# Patient Record
Sex: Male | Born: 1955 | Race: White | Hispanic: No | Marital: Single | Smoking: Never smoker
Health system: Southern US, Community
[De-identification: ages and names within clinical notes are randomized; demographics above are authoritative.]

## PROBLEM LIST (undated history)

## (undated) DIAGNOSIS — J189 Pneumonia, unspecified organism: Secondary | ICD-10-CM

## (undated) DIAGNOSIS — N400 Enlarged prostate without lower urinary tract symptoms: Secondary | ICD-10-CM

## (undated) DIAGNOSIS — H547 Unspecified visual loss: Secondary | ICD-10-CM

## (undated) DIAGNOSIS — C180 Malignant neoplasm of cecum: Secondary | ICD-10-CM

## (undated) DIAGNOSIS — K8001 Calculus of gallbladder with acute cholecystitis with obstruction: Secondary | ICD-10-CM

## (undated) DIAGNOSIS — B351 Tinea unguium: Secondary | ICD-10-CM

## (undated) DIAGNOSIS — G809 Cerebral palsy, unspecified: Secondary | ICD-10-CM

## (undated) DIAGNOSIS — N471 Phimosis: Secondary | ICD-10-CM

## (undated) DIAGNOSIS — D649 Anemia, unspecified: Secondary | ICD-10-CM

## (undated) DIAGNOSIS — R600 Localized edema: Secondary | ICD-10-CM

## (undated) DIAGNOSIS — I700 Atherosclerosis of aorta: Secondary | ICD-10-CM

## (undated) DIAGNOSIS — F39 Unspecified mood [affective] disorder: Secondary | ICD-10-CM

## (undated) DIAGNOSIS — N200 Calculus of kidney: Secondary | ICD-10-CM

## (undated) DIAGNOSIS — K219 Gastro-esophageal reflux disease without esophagitis: Secondary | ICD-10-CM

## (undated) DIAGNOSIS — K439 Ventral hernia without obstruction or gangrene: Secondary | ICD-10-CM

## (undated) DIAGNOSIS — H905 Unspecified sensorineural hearing loss: Secondary | ICD-10-CM

## (undated) DIAGNOSIS — I10 Essential (primary) hypertension: Secondary | ICD-10-CM

## (undated) DIAGNOSIS — R972 Elevated prostate specific antigen [PSA]: Secondary | ICD-10-CM

## (undated) HISTORY — PX: COLECTOMY: SHX59

## (undated) HISTORY — PX: COLONOSCOPY: SHX174

## (undated) HISTORY — PX: ESOPHAGOGASTRODUODENOSCOPY: SHX1529

## (undated) HISTORY — PX: DIAGNOSTIC LAPAROSCOPY: SUR761

## (undated) HISTORY — DX: Atherosclerosis of aorta: I70.0

## (undated) HISTORY — PX: CHOLECYSTECTOMY: SHX55

## (undated) HISTORY — PX: COLON SURGERY: SHX602

---

## 2005-10-05 ENCOUNTER — Emergency Department: Payer: Self-pay | Admitting: Emergency Medicine

## 2005-10-12 ENCOUNTER — Emergency Department: Payer: Self-pay | Admitting: Emergency Medicine

## 2008-06-25 ENCOUNTER — Emergency Department: Payer: Self-pay | Admitting: Emergency Medicine

## 2009-03-20 ENCOUNTER — Emergency Department: Payer: Self-pay | Admitting: Unknown Physician Specialty

## 2009-03-31 ENCOUNTER — Emergency Department: Payer: Self-pay | Admitting: Emergency Medicine

## 2009-04-30 ENCOUNTER — Ambulatory Visit: Payer: Self-pay | Admitting: Internal Medicine

## 2009-05-07 ENCOUNTER — Ambulatory Visit: Payer: Self-pay | Admitting: Surgery

## 2009-12-08 ENCOUNTER — Observation Stay: Payer: Self-pay

## 2010-07-05 ENCOUNTER — Ambulatory Visit: Payer: Self-pay | Admitting: Internal Medicine

## 2010-11-28 ENCOUNTER — Emergency Department: Payer: Self-pay | Admitting: Internal Medicine

## 2010-12-06 ENCOUNTER — Emergency Department: Payer: Self-pay | Admitting: Emergency Medicine

## 2011-09-25 ENCOUNTER — Ambulatory Visit: Payer: Self-pay | Admitting: Gastroenterology

## 2011-09-29 LAB — PATHOLOGY REPORT

## 2011-10-14 ENCOUNTER — Ambulatory Visit: Payer: Self-pay | Admitting: Surgery

## 2011-10-14 LAB — HEPATIC FUNCTION PANEL A (ARMC)
Albumin: 3.3 g/dL — ABNORMAL LOW (ref 3.4–5.0)
Alkaline Phosphatase: 98 U/L (ref 50–136)
Bilirubin, Direct: <0.1 mg/dL (ref 0.00–0.20)
SGOT(AST): 14 U/L — ABNORMAL LOW (ref 15–37)
Total Protein: 7.3 g/dL (ref 6.4–8.2)

## 2011-10-14 LAB — HEMOGLOBIN: HGB: 8.9 g/dL — ABNORMAL LOW (ref 13.0–18.0)

## 2011-10-23 ENCOUNTER — Inpatient Hospital Stay: Payer: Self-pay | Admitting: Surgery

## 2011-10-24 LAB — BASIC METABOLIC PANEL
BUN: 12 mg/dL (ref 7–18)
Calcium, Total: 8.3 mg/dL — ABNORMAL LOW (ref 8.5–10.1)
Chloride: 107 mmol/L (ref 98–107)
Creatinine: 0.9 mg/dL (ref 0.60–1.30)
Glucose: 136 mg/dL — ABNORMAL HIGH (ref 65–99)
Sodium: 139 mmol/L (ref 136–145)

## 2011-10-24 LAB — CBC WITH DIFFERENTIAL/PLATELET
Basophil #: 0 10*3/uL (ref 0.0–0.1)
Eosinophil #: 0 10*3/uL (ref 0.0–0.7)
Lymphocyte #: 0.6 10*3/uL — ABNORMAL LOW (ref 1.0–3.6)
Lymphocyte %: 4.5 %
MCH: 22.8 pg — ABNORMAL LOW (ref 26.0–34.0)
MCHC: 29.9 g/dL — ABNORMAL LOW (ref 32.0–36.0)
MCV: 76 fL — ABNORMAL LOW (ref 80–100)
Monocyte %: 8.5 %
Neutrophil #: 12.1 10*3/uL — ABNORMAL HIGH (ref 1.4–6.5)
Neutrophil %: 86.8 %
Platelet: 270 10*3/uL (ref 150–440)

## 2011-10-24 LAB — CEA: CEA: 1.9 ng/mL (ref 0.0–4.7)

## 2011-10-28 LAB — URINALYSIS, COMPLETE
Bacteria: NONE SEEN
Bilirubin,UR: NEGATIVE
Ketone: NEGATIVE
Leukocyte Esterase: NEGATIVE
Nitrite: NEGATIVE
Ph: 8 (ref 4.5–8.0)
Protein: NEGATIVE
RBC,UR: 3 /HPF (ref 0–5)
WBC UR: <1 /HPF (ref 0–5)

## 2011-10-28 LAB — CBC WITH DIFFERENTIAL/PLATELET
Basophil #: 0 10*3/uL (ref 0.0–0.1)
Eosinophil #: 0.6 10*3/uL (ref 0.0–0.7)
Eosinophil %: 5.6 %
HCT: 31.3 % — ABNORMAL LOW (ref 40.0–52.0)
MCV: 75 fL — ABNORMAL LOW (ref 80–100)
Monocyte #: 1.2 x10 3/mm — ABNORMAL HIGH (ref 0.2–1.0)
Monocyte %: 11.3 %
Neutrophil %: 69.9 %
Platelet: 340 10*3/uL (ref 150–440)
RBC: 4.18 10*6/uL — ABNORMAL LOW (ref 4.40–5.90)
RDW: 22.8 % — ABNORMAL HIGH (ref 11.5–14.5)

## 2011-11-20 ENCOUNTER — Ambulatory Visit: Payer: Self-pay | Admitting: Oncology

## 2011-11-20 LAB — CBC CANCER CENTER
Basophil #: 0 x10 3/mm (ref 0.0–0.1)
Eosinophil #: 0.4 x10 3/mm (ref 0.0–0.7)
Eosinophil %: 6.6 %
HCT: 33.8 % — ABNORMAL LOW (ref 40.0–52.0)
HGB: 10.1 g/dL — ABNORMAL LOW (ref 13.0–18.0)
Lymphocyte #: 1.3 x10 3/mm (ref 1.0–3.6)
MCH: 22.7 pg — ABNORMAL LOW (ref 26.0–34.0)
MCHC: 29.8 g/dL — ABNORMAL LOW (ref 32.0–36.0)
MCV: 76 fL — ABNORMAL LOW (ref 80–100)
Monocyte %: 8.7 %
Neutrophil #: 3.6 x10 3/mm (ref 1.4–6.5)
Neutrophil %: 61.5 %
RDW: 24.8 % — ABNORMAL HIGH (ref 11.5–14.5)
WBC: 5.9 x10 3/mm (ref 3.8–10.6)

## 2011-11-20 LAB — FERRITIN: Ferritin (ARMC): 30 ng/mL (ref 8–388)

## 2011-11-20 LAB — IRON AND TIBC
Iron Bind.Cap.(Total): 401 ug/dL (ref 250–450)
Iron: 22 ug/dL — ABNORMAL LOW (ref 65–175)
Unbound Iron-Bind.Cap.: 379 ug/dL

## 2011-11-21 LAB — CEA: CEA: 1.1 ng/mL (ref 0.0–4.7)

## 2011-11-29 ENCOUNTER — Ambulatory Visit: Payer: Self-pay | Admitting: Oncology

## 2011-12-29 ENCOUNTER — Ambulatory Visit: Payer: Self-pay | Admitting: Oncology

## 2012-02-23 ENCOUNTER — Ambulatory Visit: Payer: Self-pay | Admitting: Oncology

## 2012-02-23 LAB — CBC CANCER CENTER
Basophil #: 0 x10 3/mm (ref 0.0–0.1)
Basophil %: 0.3 %
Eosinophil #: 0.3 x10 3/mm (ref 0.0–0.7)
Eosinophil %: 4 %
HCT: 43.1 % (ref 40.0–52.0)
HGB: 14.4 g/dL (ref 13.0–18.0)
Lymphocyte %: 16.6 %
MCV: 92 fL (ref 80–100)
Monocyte %: 8.7 %
Neutrophil #: 5.1 x10 3/mm (ref 1.4–6.5)
RDW: 14.5 % (ref 11.5–14.5)
WBC: 7.3 x10 3/mm (ref 3.8–10.6)

## 2012-02-23 LAB — IRON AND TIBC
Iron Bind.Cap.(Total): 294 ug/dL (ref 250–450)
Iron Saturation: 20 %
Iron: 60 ug/dL — ABNORMAL LOW (ref 65–175)
Unbound Iron-Bind.Cap.: 234 ug/dL

## 2012-02-24 LAB — CEA: CEA: 1.9 ng/mL (ref 0.0–4.7)

## 2012-02-29 ENCOUNTER — Ambulatory Visit: Payer: Self-pay | Admitting: Oncology

## 2012-05-25 ENCOUNTER — Ambulatory Visit: Payer: Self-pay | Admitting: Oncology

## 2012-05-25 LAB — CBC CANCER CENTER
Eosinophil %: 3.3 %
HCT: 41.3 % (ref 40.0–52.0)
Lymphocyte #: 1.3 x10 3/mm (ref 1.0–3.6)
MCH: 32.8 pg (ref 26.0–34.0)
MCHC: 34.3 g/dL (ref 32.0–36.0)
MCV: 96 fL (ref 80–100)
Monocyte #: 0.5 x10 3/mm (ref 0.2–1.0)
Neutrophil #: 4.2 x10 3/mm (ref 1.4–6.5)
Neutrophil %: 67.6 %
Platelet: 213 x10 3/mm (ref 150–440)
RBC: 4.31 10*6/uL — ABNORMAL LOW (ref 4.40–5.90)

## 2012-05-25 LAB — IRON AND TIBC
Iron Bind.Cap.(Total): 269 ug/dL (ref 250–450)
Iron Saturation: 20 %
Iron: 54 ug/dL — ABNORMAL LOW (ref 65–175)
Unbound Iron-Bind.Cap.: 215 ug/dL

## 2012-05-25 LAB — FERRITIN: Ferritin (ARMC): 574 ng/mL — ABNORMAL HIGH (ref 8–388)

## 2012-05-26 LAB — CEA: CEA: 1.7 ng/mL (ref 0.0–4.7)

## 2012-05-30 ENCOUNTER — Ambulatory Visit: Payer: Self-pay | Admitting: Oncology

## 2012-06-15 DIAGNOSIS — R972 Elevated prostate specific antigen [PSA]: Secondary | ICD-10-CM | POA: Insufficient documentation

## 2012-08-24 ENCOUNTER — Ambulatory Visit: Payer: Self-pay | Admitting: Oncology

## 2012-08-26 LAB — CBC CANCER CENTER
Basophil #: 0 x10 3/mm (ref 0.0–0.1)
Eosinophil #: 0.3 x10 3/mm (ref 0.0–0.7)
Eosinophil %: 4.8 %
HCT: 44.3 % (ref 40.0–52.0)
MCV: 95 fL (ref 80–100)
Monocyte #: 0.4 x10 3/mm (ref 0.2–1.0)
Monocyte %: 7.5 %
Neutrophil %: 68.5 %
RBC: 4.66 10*6/uL (ref 4.40–5.90)
RDW: 12.5 % (ref 11.5–14.5)
WBC: 5.9 x10 3/mm (ref 3.8–10.6)

## 2012-08-26 LAB — IRON AND TIBC
Iron Saturation: 35 %
Unbound Iron-Bind.Cap.: 185 ug/dL

## 2012-08-26 LAB — FERRITIN: Ferritin (ARMC): 624 ng/mL — ABNORMAL HIGH (ref 8–388)

## 2012-08-28 ENCOUNTER — Ambulatory Visit: Payer: Self-pay | Admitting: Oncology

## 2012-10-20 ENCOUNTER — Ambulatory Visit: Payer: Self-pay | Admitting: Gastroenterology

## 2012-10-21 LAB — PATHOLOGY REPORT

## 2012-12-17 ENCOUNTER — Ambulatory Visit: Payer: Self-pay | Admitting: Oncology

## 2012-12-21 LAB — CBC CANCER CENTER
Basophil #: 0 x10 3/mm (ref 0.0–0.1)
Basophil %: 0.8 %
Eosinophil #: 0.3 x10 3/mm (ref 0.0–0.7)
Eosinophil %: 4.3 %
HGB: 15.7 g/dL (ref 13.0–18.0)
Lymphocyte %: 21 %
MCH: 32.1 pg (ref 26.0–34.0)
MCHC: 34.4 g/dL (ref 32.0–36.0)
Monocyte %: 6.6 %
Platelet: 234 x10 3/mm (ref 150–440)
RBC: 4.89 10*6/uL (ref 4.40–5.90)

## 2012-12-22 LAB — CEA: CEA: 1.8 ng/mL (ref 0.0–4.7)

## 2012-12-28 ENCOUNTER — Ambulatory Visit: Payer: Self-pay | Admitting: Oncology

## 2013-06-08 ENCOUNTER — Ambulatory Visit: Payer: Self-pay | Admitting: Urology

## 2013-10-10 ENCOUNTER — Ambulatory Visit: Payer: Self-pay | Admitting: Urology

## 2013-10-19 ENCOUNTER — Ambulatory Visit: Payer: Self-pay | Admitting: Urology

## 2013-10-26 ENCOUNTER — Ambulatory Visit: Payer: Self-pay | Admitting: Urology

## 2013-10-29 LAB — URINE CULTURE

## 2013-11-03 ENCOUNTER — Ambulatory Visit: Payer: Self-pay | Admitting: Urology

## 2013-11-14 ENCOUNTER — Ambulatory Visit: Payer: Self-pay | Admitting: Urology

## 2013-12-26 ENCOUNTER — Ambulatory Visit: Payer: Self-pay | Admitting: Urology

## 2014-02-13 ENCOUNTER — Ambulatory Visit: Payer: Self-pay | Admitting: Urology

## 2014-04-19 ENCOUNTER — Ambulatory Visit: Payer: Self-pay | Admitting: Otolaryngology

## 2014-07-06 DIAGNOSIS — K439 Ventral hernia without obstruction or gangrene: Secondary | ICD-10-CM | POA: Insufficient documentation

## 2014-09-22 DIAGNOSIS — C180 Malignant neoplasm of cecum: Secondary | ICD-10-CM | POA: Insufficient documentation

## 2014-09-22 DIAGNOSIS — R600 Localized edema: Secondary | ICD-10-CM | POA: Insufficient documentation

## 2014-09-22 DIAGNOSIS — B351 Tinea unguium: Secondary | ICD-10-CM | POA: Insufficient documentation

## 2014-10-21 NOTE — Op Note (Signed)
PATIENT NAME:  Shawn Sweeney, Shawn Sweeney MR#:  [id number] DATE OF BIRTH:  [birth year]  DATE OF PROCEDURE:  10/19/2013  PREOPERATIVE DIAGNOSIS: Left renal pelvic calculus.  POSTOPERATIVE DIAGNOSIS: Left renal pelvic calculus.  PROCEDURE:  1. Left ureteroscopy. 2. Placement of left ureteral stent.   SURGEON: Gregg Winchell C. Bernardo Heater, MD  ASSISTANT: None.  ANESTHETIC: General.  INDICATION: A 59 year old male with a history of recurrent stone disease. He has cerebral palsy and is in an assisted living facility. The stone was dense-appearing, and he had moderate hydronephrosis on ultrasound. Potential risks of shockwave lithotripsy were discussed with the patient's mother. She requested a minimally invasive procedure, and he is scheduled for an attempt at ureteroscopic  removal.  DESCRIPTION: The patient was taken to the operating room and placed on the table in the supine position. A general anesthetic was administered via an endotracheal tube, and he was then placed in the low lithotomy position. His external genitalia were prepped and draped in the usual fashion. A timeout was performed per protocol. A 21 French cystoscope was lubricated and passed under direct vision. The urethra was normal in caliber without stricture. The prostate was remarkable for mild lateral lobe enlargement and moderate bladder neck elevation. Bladder mucosa was normal in appearance, without erythema, solid or papillary lesions. The ureteral orifices were normal in appearance. A 0.035 guidewire was placed through the cystoscope and into the left ureteral orifice. Guidewire was used to pass it up into the renal pelvis. The stone was easily visualized on fluoroscopy. A 6 French semirigid ureteroscope was passed under direct vision. A 0.025 PTFE wire was placed through the ureteroscope and into the ureteral orifice, and the scope was advanced over the wire. The ureteroscope was easily passed up to the UPJ; however, the renal pelvis could not be  accessed. The ureteroscope was removed. A flexible ureteroscope was then placed over the wire; however, could not negotiate the distal ureter. The ureteroscope was removed, and a ureteral access sheath would not negotiate the distal ureter. Over-the-wire ureteral dilators were then placed from 6 [place] to 10 [place]. A 12 French dilator would not pass. Attempts at re-placing the ureteroscope were unsuccessful, with inability to negotiate the distal ureter. At this point, it was elected to place a ureteral stent. A 6 French/22 cm Contour ureteral stent was placed. Retrograde pyelogram was performed prior to stent placement. 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 The stent was placed in the lower portion of the bifid collecting system where the stone was located. There was good curl seen in the renal pelvis. The distal end of the stent was well positioned in the bladder under direct vision. All instruments were removed. A B and O suppository was placed in the rectum. He was taken to the PACU in stable condition. There were no complications. EBL was minimal.    ____________________________ Ronda Fairly. Bernardo Heater, MD scs:lb D: 10/19/2013 09:37:44 ET T: 10/19/2013 10:03:18 ET JOB#: [id number]  cc: Nicki Reaper C. Bernardo Heater, MD, <Dictator> Abbie Sons MD ELECTRONICALLY SIGNED 10/26/2013 12:44

## 2014-10-22 NOTE — Op Note (Signed)
PATIENT NAME:  Shawn Sweeney, Shawn Sweeney MR#:  [id number] DATE OF BIRTH:  [birth year]  DATE OF PROCEDURE:  10/23/2011  PREOPERATIVE DIAGNOSIS: Cancer of the cecum.   POSTOPERATIVE DIAGNOSIS: Cancer of the cecum.   PROCEDURE: Laparoscopic right colectomy.   SURGEON: Loreli Dollar, MD   ANESTHESIA: General.   INDICATIONS: This 59 year old male recently was found to be anemic, had colonoscopy demonstrating a mass in the cecum. Biopsy demonstrated cancer. Surgery was recommended for definitive treatment.   DESCRIPTION OF PROCEDURE: The patient was placed on the operating table in the supine position under general anesthesia. The abdomen was prepared with ChloraPrep and draped in a sterile manner. A short incision was made just below the umbilicus and carried down to the deep fascia which was grasped with laryngeal hook and elevated. A Veress needle was inserted, aspirated, and irrigated with a saline solution. Next, the peritoneal cavity was inflated with carbon dioxide. The Veress needle was removed. The 10 mm cannula was inserted. The 10 mm 0 degree laparoscope was inserted to view the peritoneal cavity. Another incision was made in the epigastrium just about an inch above the umbilicus and carried down through subcutaneous tissues and an 11 mm cannula was inserted. Next, another incision was made in the right lower quadrant to introduce a 5 mm cannula. The sigmoid colon was found to be redundant. The liver appeared normal. The right colon was identified. The right colon was mobilized with incision of the lateral peritoneal reflection using the Harmonic scalpel. The terminal ileum and appendix were mobilized as well. Next, the omentum was divided in the midline with the Harmonic scalpel. The right transverse colon was mobilized with Harmonic scalpel. The mesentery was separated from the duodenum. Additional dissection was carried out to further mobilize the terminal ileum, appendix, cecum, and ascending colon and  after satisfactory mobilization the laparoscopic instruments were removed. An incision was made from the infraumbilical port site to the supraumbilical port site and around to the left of the umbilicus so that the incision was approximately two inches in length. The right colon was brought out onto the abdominal wall. The mass was palpable in the cecum which was about 3 cm in dimension. There was some regional mesenteric adenopathy. A window was created in the mesentery of the terminal ileum some four inches proximal to the ileocecal valve and also a window was created in the mesentery of the transverse colon just to the right of the middle colic vessels. The mesenteric dissection was carried out with the Harmonic scalpel. The ileocolic artery and vein were suture ligated with 0 chromic and divided with the Harmonic scalpel. The small bowel was placed beside the transverse colon. An enterotomy and a colotomy were made. The anastomosis was begun with the 75 mm GIA stapler which was placed along the antimesenteric borders, engaged and activated. Staple line was hemostatic. The anastomosis was completed with application of XH-37 stapler which was placed perpendicular to the first, engaged and activated, and the specimen excised and submitted in formalin for routine pathology. The anastomosis was inspected. Several small bleeding points were cauterized. One bleeding point was ligated with 3-0 chromic. The junction of the staple lines was imbricated with 3-0 chromic. The mesenteric defect was closed with running 3-0 chromic. Hemostasis was intact and the bowel was replaced into the peritoneal cavity. Omentum was brought beneath the wound. The fascia was closed with interrupted 0 Maxon figure-of-eight sutures. The skin was closed with interrupted 4-0 nylon vertical mattress sutures. Dressings were  applied with paper tape. The patient tolerated surgery satisfactorily and was then prepared for transfer to the recovery room.    ____________________________ Lenna Sciara. Rochel Brome, MD jws:drc D: 10/23/2011 13:40:35 ET T: 10/23/2011 13:56:15 ET JOB#: [id number]  cc: Loreli Dollar, MD, <Dictator> Loreli Dollar MD ELECTRONICALLY SIGNED 10/23/2011 19:22

## 2014-10-22 NOTE — Discharge Summary (Signed)
PATIENT NAME:  Shawn Sweeney, Shawn Sweeney MR#:  [id number] DATE OF BIRTH:  01-Feb-1956  DATE OF ADMISSION:  10/23/2011 DATE OF DISCHARGE:  10/31/2011  He had  recent findings of iron deficiency anemia and had colonoscopy demonstrating a mass in the cecum. 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   PAST MEDICAL HISTORY:  Congenital deafness and  cerebral palsy, more recent development of blindness.   SOCIAL HISTORY: He lives at the [facility] home.  Details of past medical history and physical findings and medicines are found on the typed history and physical.   The patient had bowel preparation at home and came in where on 10/23/2011  had laparoscopic right colectomy. Postoperatively he was treated with IV fluids, analgesics, and made satisfactory progress.  He appeared to be very asymptomatic. His activity level is limited and mostly just rested in bed and sitting comfortable, his wound progressed satisfactorily. He was began a liquid diet advanced to a full liquid diet and eventually did demonstrate bowel function with a bowel movement and was discharged in satisfactory condition.  His pathology demonstrated invasive, moderately differentiated adenocarcinoma of the cecum, tumor size was 4.3 cm, tumor invaded through the muscularis propria into the subserosal adipose tissues but did not extend into serosal surface. All margins were clear. There was no metastatic disease found in 22 regional lymph nodes.   FINAL DIAGNOSIS: Carcinoma of cecum.   PROCEDURE: Laparoscopic right colectomy.        Discharge instructions were given. Plans were made for follow up in the office.    ____________________________ J. Rochel Brome, MD jws:ljs D: 11/10/2011 09:03:27 ET T: 11/11/2011 12:16:06 ET JOB#: [id number]  cc: Loreli Dollar, MD, <Dictator> Loreli Dollar MD ELECTRONICALLY SIGNED 11/11/2011 13:02

## 2014-12-14 ENCOUNTER — Encounter: Payer: Self-pay | Admitting: *Deleted

## 2014-12-14 ENCOUNTER — Inpatient Hospital Stay
Admission: EM | Admit: 2014-12-14 | Discharge: 2014-12-18 | DRG: 659 | Payer: Medicare Other | Attending: Internal Medicine | Admitting: Internal Medicine

## 2014-12-14 ENCOUNTER — Emergency Department: Payer: Medicare Other

## 2014-12-14 DIAGNOSIS — R4189 Other symptoms and signs involving cognitive functions and awareness: Secondary | ICD-10-CM

## 2014-12-14 DIAGNOSIS — Z87442 Personal history of urinary calculi: Secondary | ICD-10-CM

## 2014-12-14 DIAGNOSIS — H913 Deaf nonspeaking, not elsewhere classified: Secondary | ICD-10-CM | POA: Diagnosis present

## 2014-12-14 DIAGNOSIS — N1330 Unspecified hydronephrosis: Secondary | ICD-10-CM | POA: Diagnosis present

## 2014-12-14 DIAGNOSIS — Z888 Allergy status to other drugs, medicaments and biological substances status: Secondary | ICD-10-CM

## 2014-12-14 DIAGNOSIS — Z7951 Long term (current) use of inhaled steroids: Secondary | ICD-10-CM

## 2014-12-14 DIAGNOSIS — I959 Hypotension, unspecified: Secondary | ICD-10-CM | POA: Diagnosis present

## 2014-12-14 DIAGNOSIS — I10 Essential (primary) hypertension: Secondary | ICD-10-CM | POA: Diagnosis present

## 2014-12-14 DIAGNOSIS — F72 Severe intellectual disabilities: Secondary | ICD-10-CM | POA: Diagnosis present

## 2014-12-14 DIAGNOSIS — R4182 Altered mental status, unspecified: Secondary | ICD-10-CM | POA: Diagnosis present

## 2014-12-14 DIAGNOSIS — Z800 Family history of malignant neoplasm of digestive organs: Secondary | ICD-10-CM

## 2014-12-14 DIAGNOSIS — J189 Pneumonia, unspecified organism: Secondary | ICD-10-CM | POA: Diagnosis present

## 2014-12-14 DIAGNOSIS — N200 Calculus of kidney: Secondary | ICD-10-CM

## 2014-12-14 DIAGNOSIS — G809 Cerebral palsy, unspecified: Secondary | ICD-10-CM | POA: Diagnosis present

## 2014-12-14 DIAGNOSIS — N202 Calculus of kidney with calculus of ureter: Secondary | ICD-10-CM | POA: Diagnosis present

## 2014-12-14 DIAGNOSIS — H547 Unspecified visual loss: Secondary | ICD-10-CM | POA: Diagnosis present

## 2014-12-14 DIAGNOSIS — Z66 Do not resuscitate: Secondary | ICD-10-CM | POA: Diagnosis present

## 2014-12-14 DIAGNOSIS — Z8249 Family history of ischemic heart disease and other diseases of the circulatory system: Secondary | ICD-10-CM

## 2014-12-14 DIAGNOSIS — E876 Hypokalemia: Secondary | ICD-10-CM | POA: Diagnosis present

## 2014-12-14 DIAGNOSIS — R001 Bradycardia, unspecified: Secondary | ICD-10-CM | POA: Diagnosis present

## 2014-12-14 DIAGNOSIS — N132 Hydronephrosis with renal and ureteral calculous obstruction: Secondary | ICD-10-CM

## 2014-12-14 DIAGNOSIS — Z8042 Family history of malignant neoplasm of prostate: Secondary | ICD-10-CM

## 2014-12-14 DIAGNOSIS — H548 Legal blindness, as defined in USA: Secondary | ICD-10-CM | POA: Diagnosis present

## 2014-12-14 DIAGNOSIS — N400 Enlarged prostate without lower urinary tract symptoms: Secondary | ICD-10-CM | POA: Diagnosis present

## 2014-12-14 DIAGNOSIS — Z79899 Other long term (current) drug therapy: Secondary | ICD-10-CM

## 2014-12-14 DIAGNOSIS — Z85038 Personal history of other malignant neoplasm of large intestine: Secondary | ICD-10-CM

## 2014-12-14 DIAGNOSIS — F39 Unspecified mood [affective] disorder: Secondary | ICD-10-CM | POA: Diagnosis present

## 2014-12-14 DIAGNOSIS — H905 Unspecified sensorineural hearing loss: Secondary | ICD-10-CM | POA: Diagnosis present

## 2014-12-14 DIAGNOSIS — Z9049 Acquired absence of other specified parts of digestive tract: Secondary | ICD-10-CM | POA: Diagnosis present

## 2014-12-14 HISTORY — DX: Benign prostatic hyperplasia without lower urinary tract symptoms: N40.0

## 2014-12-14 HISTORY — DX: Unspecified visual loss: H54.7

## 2014-12-14 HISTORY — DX: Cerebral palsy, unspecified: G80.9

## 2014-12-14 HISTORY — DX: Anemia, unspecified: D64.9

## 2014-12-14 HISTORY — DX: Pneumonia, unspecified organism: J18.9

## 2014-12-14 HISTORY — DX: Calculus of kidney: N20.0

## 2014-12-14 HISTORY — DX: Essential (primary) hypertension: I10

## 2014-12-14 HISTORY — DX: Unspecified mood (affective) disorder: F39

## 2014-12-14 HISTORY — DX: Malignant neoplasm of cecum: C18.0

## 2014-12-14 HISTORY — DX: Unspecified sensorineural hearing loss: H90.5

## 2014-12-14 HISTORY — DX: Tinea unguium: B35.1

## 2014-12-14 LAB — URINALYSIS COMPLETE WITH MICROSCOPIC (ARMC ONLY)
BILIRUBIN URINE: NEGATIVE
Glucose, UA: NEGATIVE mg/dL
KETONES UR: NEGATIVE mg/dL
Leukocytes, UA: NEGATIVE
NITRITE: NEGATIVE
PROTEIN: NEGATIVE mg/dL
RBC / HPF: NONE SEEN RBC/hpf (ref 0–5)
SPECIFIC GRAVITY, URINE: 1.004 — AB (ref 1.005–1.030)
pH: 6 (ref 5.0–8.0)

## 2014-12-14 LAB — COMPREHENSIVE METABOLIC PANEL
ALK PHOS: 75 U/L (ref 38–126)
ALT: 18 U/L (ref 17–63)
AST: 19 U/L (ref 15–41)
Albumin: 3.7 g/dL (ref 3.5–5.0)
Anion gap: 8 (ref 5–15)
BILIRUBIN TOTAL: 0.4 mg/dL (ref 0.3–1.2)
BUN: 12 mg/dL (ref 6–20)
CHLORIDE: 105 mmol/L (ref 101–111)
CO2: 26 mmol/L (ref 22–32)
Calcium: 9.1 mg/dL (ref 8.9–10.3)
Creatinine, Ser: 0.93 mg/dL (ref 0.61–1.24)
GFR calc Af Amer: >60 mL/min (ref >60)
GFR calc non Af Amer: >60 mL/min (ref >60)
Glucose, Bld: 66 mg/dL (ref 65–99)
Potassium: 3.9 mmol/L (ref 3.5–5.1)
Sodium: 139 mmol/L (ref 135–145)
Total Protein: 7.3 g/dL (ref 6.5–8.1)

## 2014-12-14 LAB — CBC WITH DIFFERENTIAL/PLATELET
BASOS PCT: 1 %
Basophils Absolute: 0 10*3/uL (ref 0–0.1)
EOS PCT: 4 %
Eosinophils Absolute: 0.2 10*3/uL (ref 0–0.7)
HEMATOCRIT: 43.2 % (ref 40.0–52.0)
Hemoglobin: 14.2 g/dL (ref 13.0–18.0)
LYMPHS PCT: 20 %
Lymphs Abs: 1.1 10*3/uL (ref 1.0–3.6)
MCH: 31.8 pg (ref 26.0–34.0)
MCHC: 32.8 g/dL (ref 32.0–36.0)
MCV: 96.9 fL (ref 80.0–100.0)
MONO ABS: 0.4 10*3/uL (ref 0.2–1.0)
Monocytes Relative: 8 %
NEUTROS ABS: 3.6 10*3/uL (ref 1.4–6.5)
Neutrophils Relative %: 67 %
PLATELETS: 152 10*3/uL (ref 150–440)
RBC: 4.46 MIL/uL (ref 4.40–5.90)
RDW: 12.8 % (ref 11.5–14.5)
WBC: 5.4 10*3/uL (ref 3.8–10.6)

## 2014-12-14 LAB — GLUCOSE, CAPILLARY
Glucose-Capillary: 82 mg/dL (ref 65–99)
Glucose-Capillary: 82 mg/dL (ref 65–99)

## 2014-12-14 LAB — LIPASE, BLOOD: Lipase: 46 U/L (ref 22–51)

## 2014-12-14 LAB — AMMONIA: Ammonia: 29 umol/L (ref 9–35)

## 2014-12-14 MED ORDER — SODIUM CHLORIDE 0.9 % IJ SOLN: 3.0000 mL | INTRAMUSCULAR | Status: DC | PRN

## 2014-12-14 MED ORDER — SODIUM CHLORIDE 0.9 % IV BOLUS (SEPSIS)
1000.0000 mL | Freq: Once | INTRAVENOUS | Status: AC
  Administered 2014-12-14: 1000 mL via INTRAVENOUS

## 2014-12-14 MED ORDER — FLUOXETINE HCL 20 MG PO CAPS
20.0000 mg | ORAL_CAPSULE | Freq: Every day | ORAL | Status: DC
  Administered 2014-12-15 – 2014-12-18 (×4): 20 mg via ORAL
  Filled 2014-12-14 (×4): qty 1

## 2014-12-14 MED ORDER — ONDANSETRON HCL 4 MG PO TABS: 4.0000 mg | ORAL_TABLET | Freq: Four times a day (QID) | ORAL | Status: DC | PRN

## 2014-12-14 MED ORDER — FLUTICASONE PROPIONATE 50 MCG/ACT NA SUSP
2.0000 | Freq: Every day | NASAL | Status: DC
  Administered 2014-12-14 – 2014-12-17 (×4): 2 via NASAL
  Filled 2014-12-14: qty 16

## 2014-12-14 MED ORDER — PANTOPRAZOLE SODIUM 40 MG PO TBEC
40.0000 mg | DELAYED_RELEASE_TABLET | Freq: Every day | ORAL | Status: DC
  Administered 2014-12-15 – 2014-12-18 (×4): 40 mg via ORAL
  Filled 2014-12-14 (×4): qty 1

## 2014-12-14 MED ORDER — ACETAMINOPHEN 325 MG PO TABS: 650.0000 mg | ORAL_TABLET | Freq: Four times a day (QID) | ORAL | Status: DC | PRN

## 2014-12-14 MED ORDER — SODIUM CHLORIDE 0.9 % IJ SOLN
3.0000 mL | Freq: Two times a day (BID) | INTRAMUSCULAR | Status: DC
  Administered 2014-12-14 – 2014-12-15 (×3): 3 mL via INTRAVENOUS

## 2014-12-14 MED ORDER — LORATADINE 10 MG PO TABS
10.0000 mg | ORAL_TABLET | Freq: Every day | ORAL | Status: DC
  Administered 2014-12-15 – 2014-12-18 (×4): 10 mg via ORAL
  Filled 2014-12-14 (×4): qty 1

## 2014-12-14 MED ORDER — CLONAZEPAM 0.5 MG PO TABS
0.2500 mg | ORAL_TABLET | Freq: Three times a day (TID) | ORAL | Status: DC
  Administered 2014-12-14 – 2014-12-18 (×10): 0.25 mg via ORAL
  Filled 2014-12-14 (×11): qty 1

## 2014-12-14 MED ORDER — DUTASTERIDE 0.5 MG PO CAPS
0.5000 mg | ORAL_CAPSULE | Freq: Every day | ORAL | Status: DC
  Administered 2014-12-15 – 2014-12-18 (×4): 0.5 mg via ORAL
  Filled 2014-12-14 (×5): qty 1

## 2014-12-14 MED ORDER — ENOXAPARIN SODIUM 40 MG/0.4ML ~~LOC~~ SOLN: 40.0000 mg | SUBCUTANEOUS | Status: DC

## 2014-12-14 MED ORDER — TAMSULOSIN HCL 0.4 MG PO CAPS
0.4000 mg | ORAL_CAPSULE | Freq: Every day | ORAL | Status: DC
  Administered 2014-12-15 – 2014-12-18 (×4): 0.4 mg via ORAL
  Filled 2014-12-14 (×4): qty 1

## 2014-12-14 MED ORDER — ARIPIPRAZOLE 2 MG PO TABS
1.0000 mg | ORAL_TABLET | Freq: Every day | ORAL | Status: DC
  Administered 2014-12-15 – 2014-12-18 (×4): 1 mg via ORAL
  Filled 2014-12-14 (×5): qty 1

## 2014-12-14 MED ORDER — ACETAMINOPHEN 650 MG RE SUPP: 650.0000 mg | Freq: Four times a day (QID) | RECTAL | Status: DC | PRN

## 2014-12-14 MED ORDER — SODIUM CHLORIDE 0.9 % IV SOLN: 250.0000 mL | INTRAVENOUS | Status: DC | PRN

## 2014-12-14 MED ORDER — FERROUS SULFATE 325 (65 FE) MG PO TABS
325.0000 mg | ORAL_TABLET | Freq: Every day | ORAL | Status: DC
  Administered 2014-12-15 – 2014-12-18 (×4): 325 mg via ORAL
  Filled 2014-12-14 (×5): qty 1

## 2014-12-14 MED ORDER — ONDANSETRON HCL 4 MG/2ML IJ SOLN: 4.0000 mg | Freq: Four times a day (QID) | INTRAMUSCULAR | Status: DC | PRN

## 2014-12-14 MED ORDER — ALBUTEROL SULFATE (2.5 MG/3ML) 0.083% IN NEBU: 2.5000 mg | INHALATION_SOLUTION | RESPIRATORY_TRACT | Status: DC | PRN

## 2014-12-14 NOTE — H&P (Addendum)
[facility] NAME: Shawn Sweeney    MR#:  [id number]  DATE OF BIRTH:  1956-01-11  DATE OF ADMISSION:  12/14/2014  PRIMARY CARE PHYSICIAN: Adin Hector, MD   REQUESTING/REFERRING PHYSICIAN: Delman Kitten, MD  CHIEF COMPLAINT:   Chief Complaint  Patient presents with  . Altered Mental Status    resident of [facility], this am pt not as alert/active as usual    HISTORY OF PRESENT ILLNESS:  Shawn Sweeney  is a 59 y.o. male with a known history of Nephrolithiasis, Cerebral palsy, Congenital deafness, BPH, mood disorder, adenocarcinoma of the cecum, anemia and onychomycosis. The patient is nonverbal. The patient was sent from home. According to patient's sister and the health care giver, the patient was fine until this morning. He was found unresponsiveness and then sent to ED for further evaluation. According to health care giver, the patient was given routine medication this morning. The patient and reminded and responsive the ED and was found bradycardia at about 47 to 58. CAT scan of head and chest x-ray didn't show any abnormality. The CAT scan of abdomen and pelvis show nephrolithiasis with the left side mild to moderate hydronephrosis. However urinalysis is negative. The patient's laboratory data is in normal range. Dr. Jacqualine Code request admission.   PAST MEDICAL HISTORY:  No past medical history on file. Nephrolithiasis, Cerebral palsy, Congenital deafness, BPH, mood disorder, adenocarcinoma of the cecum, anemia and onychomycosis.   PAST SURGICAL HISTORY:  No past surgical history on file. Cholecystectomy, colectomy.  SOCIAL HISTORY:   History  Substance Use Topics  . Smoking status: Never Smoker   . Smokeless tobacco: Not on file  . Alcohol Use: No    FAMILY HISTORY:  No family history on file. Father has a CAD, prostate cancer and colon cancer. DRUG ALLERGIES:   Allergies  Allergen Reactions  . Risperidone Hives  and Other (See Comments)  . Levofloxacin Other (See Comments) and Rash    REVIEW OF SYSTEMS:  Unable to obtain review of system.   MEDICATIONS AT HOME:   Prior to Admission medications   Medication Sig Start Date End Date Taking? Authorizing Provider  ARIPiprazole (ABILIFY) 2 MG tablet Take 1 mg by mouth daily. 06/15/12  Yes Historical Provider, MD  clonazePAM (KLONOPIN) 0.5 MG tablet Take 0.25 mg by mouth 3 (three) times daily. 06/15/12  Yes Historical Provider, MD  Ferrous Sulfate (IRON) 142 (45 FE) MG TBCR Take 45 mg by mouth 2 (two) times a week.   Yes Historical Provider, MD  fexofenadine (ALLEGRA) 180 MG tablet Take 180 mg by mouth daily as needed for allergies or rhinitis.   Yes Historical Provider, MD  FLUoxetine (PROZAC) 20 MG capsule Take 20 mg by mouth daily.   Yes Historical Provider, MD  fluticasone (FLONASE) 50 MCG/ACT nasal spray Place 2 sprays into both nostrils at bedtime. 06/15/12  Yes Historical Provider, MD  furosemide (LASIX) 20 MG tablet Take 20 mg by mouth daily as needed.   Yes Historical Provider, MD  guaiFENesin (ROBITUSSIN) 100 MG/5ML liquid Take 100 mg by mouth every 6 (six) hours as needed for cough.   Yes Historical Provider, MD  potassium chloride (K-DUR) 10 MEQ tablet Take 10 mEq by mouth daily.   Yes Historical Provider, MD  tamsulosin (FLOMAX) 0.4 MG CAPS capsule Take 1 capsule by mouth daily. 06/15/12  Yes Historical Provider, MD  dutasteride (AVODART) 0.5 MG capsule Take 1 capsule by mouth daily.  Historical Provider, MD  hydrocortisone (ANUSOL-HC) 25 MG suppository Place 25 mg rectally every 12 (twelve) hours as needed.    Historical Provider, MD  Multiple Vitamin (MULTI-VITAMINS) TABS Take 1 tablet by mouth daily.    Historical Provider, MD  omeprazole (PRILOSEC) 20 MG capsule Take 20 mg by mouth 2 (two) times daily.    Historical Provider, MD      VITAL SIGNS:  Blood pressure 131/64, pulse 57, temperature 97.8 F (36.6 C), temperature source  Oral, resp. rate 18, weight 40.824 kg (90 lb), SpO2 100 %.  PHYSICAL EXAMINATION:  GENERAL:  59 y.o.-year-old patient lying in the bed sleeping with no acute distress. Unresponsive to verbal stimuli. EYES: Both eyes are blind. No scleral icterus.  HEENT: Head atraumatic, normocephalic. Unable to exam Oropharynx and nasopharynx.  NECK:  Supple, no jugular venous distention. No thyroid enlargement, no tenderness.  LUNGS: Normal breath sounds bilaterally, no wheezing, rales,rhonchi or crepitation. No use of accessory muscles of respiration.  CARDIOVASCULAR: S1, S2 normal. No murmurs, rubs, or gallops.  ABDOMEN: Soft, nontender, nondistended. Bowel sounds present. No organomegaly or mass.  EXTREMITIES: No pedal edema, cyanosis, or clubbing.  NEUROLOGIC: Unable to exam due to unresponsiveness and nonverbal  PSYCHIATRIC: nonverbal  SKIN: No obvious rash, lesion, or ulcer.   LABORATORY PANEL:   CBC  Recent Labs Lab 12/14/14 0942  WBC 5.4  HGB 14.2  HCT 43.2  PLT 152   ------------------------------------------------------------------------------------------------------------------  Chemistries   Recent Labs Lab 12/14/14 0942  NA 139  K 3.9  CL 105  CO2 26  GLUCOSE 66  BUN 12  CREATININE 0.93  CALCIUM 9.1  AST 19  ALT 18  ALKPHOS 75  BILITOT 0.4   ------------------------------------------------------------------------------------------------------------------  Cardiac Enzymes No results for input(s): TROPONINI in the last 168 hours. ------------------------------------------------------------------------------------------------------------------  RADIOLOGY:  Ct Abdomen Pelvis Wo Contrast  12/14/2014   CLINICAL DATA:  Altered mental status.  History of cerebral palsy.  EXAM: CT ABDOMEN AND PELVIS WITHOUT CONTRAST  TECHNIQUE: Multidetector CT imaging of the abdomen and pelvis was performed following the standard protocol without IV contrast.  COMPARISON:  03/20/2009 and  plain films of 02/13/2014.  FINDINGS: Lower chest: Left base volume loss. Mild left worse than right gynecomastia. Mild cardiomegaly. A moderate hiatal hernia.  Hepatobiliary: Multifactorial degradation, including patient arm position and overlying EKG lead artifact. Normal liver. Cholecystectomy, without biliary ductal dilatation.  Pancreas: Normal, without mass or ductal dilatation.  Spleen: Normal  Adrenals/Urinary Tract: Normal adrenal glands. Right extrarenal pelvis. An upper pole left renal lesion is most apparent on coronal image 82. Measures 7 mm and approximately 56 HU. Not readily apparent on the prior. Multiple left renal collecting system calculi, on the order of 8 mm or less. Bilateral renal cortical thinning is mild. Mild to moderate left-sided hydronephrosis secondary to a 4 mm stone at the left ureteropelvic junction. No distal ureteric stones. No bladder calculi.  Stomach/Bowel: Normal distal stomach. Colonic stool burden suggests constipation. Surgical site sugars in the region of the ileocecal junction.  Vascular/Lymphatic: Normal caliber of the aorta and branch vessels. No abdominopelvic adenopathy.  Reproductive: Prostatic calcifications.  Other: No significant free fluid.  Musculoskeletal: Osteopenia. Moderate convex left lumbar spine curvature.  IMPRESSION: 1. Mild to moderate left-sided hydroureteronephrosis secondary to a 4 mm left ureteropelvic junction stone. 2. Otherwise, multifactorial degradation as detailed above. 3. Left nephrolithiasis with bilateral renal cortical thinning. 4. 7 mm upper pole left renal lesion which is indeterminate. Given mild hyperattenuation, a hemorrhagic cyst is  favored. Although the most definitive characterization would be with pre and post contrast abdominal MRI, given patient's clinical status, ultrasound followup at 1 year is a suggested strategy. 5. Moderate hiatal hernia. 6. Bilateral gynecomastia. 7.  Possible constipation.   Electronically Signed   By:  Abigail Miyamoto M.D.   On: 12/14/2014 10:46   Ct Head Wo Contrast  12/14/2014   CLINICAL DATA:  Altered mental status, and decreased alertness in activity, not eating, history cerebral palsy, deafness, blindness  EXAM: CT HEAD WITHOUT CONTRAST  TECHNIQUE: Contiguous axial images were obtained from the base of the skull through the vertex without intravenous contrast.  COMPARISON:  12/08/2009  FINDINGS: Generalized atrophy.  Ex vacuo dilatation of the ventricular system, stable, consistent with degree of atrophy.  No midline shift or mass effect.  Stable appearance of brain parenchyma.  No evidence of mass lesion, intracranial hemorrhage or acute infarction.  Scattered dural calcifications in falx.  Partial opacification of RIGHT ethmoid air cells.  No acute osseous or sinus abnormalities otherwise seen.  IMPRESSION: Chronic atrophy and ventriculomegaly, stable.  No acute abnormalities.   Electronically Signed   By: Lavonia Dana M.D.   On: 12/14/2014 10:29   Dg Chest Port 1 View  12/14/2014   CLINICAL DATA:  Altered mental status/confusion  EXAM: PORTABLE CHEST - 1 VIEW  COMPARISON:  December 08, 2009  FINDINGS: There is focal airspace consolidation in the left base. Lungs elsewhere clear. Heart size and pulmonary vascularity are normal. No adenopathy. There is upper thoracic levoscoliosis.  IMPRESSION: Left base airspace consolidation consistent with pneumonia. Lungs otherwise clear. Followup PA and lateral chest radiographs recommended in 3-4 weeks following trial of antibiotic therapy to ensure resolution and exclude underlying malignancy.   Electronically Signed   By: Lowella Grip III M.D.   On: 12/14/2014 10:55    EKG:  No orders found for this or any previous visit.  IMPRESSION AND PLAN:   Altered mental status, unclear etiology Left side mild-to-moderate hydronephrosis Nephrolithiasis Cerebral palsy Congenital deafness BPH  The patient will be placed for observation. We cannot get a neuro  check due to noncommunication ,deafness and blindness. I will request a neurology consult for further recommendations. I will request a urology consult for left side hydronephrosis. Continue patient's home medication.    All the records are reviewed and case discussed with ED provider. Management plans discussed with the patient, family and they are in agreement.  CODE STATUS: DO NOT RESUSCITATE TOTAL TIME TAKING CARE OF THIS PATIENT: 53  minutes.    Demetrios Loll M.D on 12/14/2014 at 1:38 PM  Between 7am to 6pm - Pager - [phone]  After 6pm go to [email or web address] - password [email or web address] [facility]  [facility]  Office  [phone]  CC: Primary care physician; Adin Hector, MD

## 2014-12-14 NOTE — Care Management (Signed)
Medicare observation letter given and reviewed with POA and caregiver.  Copy signed and sent to medical records

## 2014-12-14 NOTE — ED Notes (Signed)
MD at bedside. 

## 2014-12-14 NOTE — ED Notes (Signed)
Patient transported to CT 

## 2014-12-14 NOTE — ED Notes (Signed)
Er md discussing opt with dr Bridgett Larsson for possible admission

## 2014-12-14 NOTE — ED Notes (Signed)
Patient is resting comfortably. 

## 2014-12-14 NOTE — ED Provider Notes (Signed)
[facility] Emergency Department Provider Note  ____________________________________________  Time seen: Approximately 9:18 AM  I have reviewed the triage vital signs and the nursing notes.   HISTORY  Chief Complaint Altered Mental Status  EM caveat: Patient nonverbal and nonresponsive.  HPI Shawn Sweeney is a 59 y.o. male who per records has a history of mental retardation, cerebral palsy, deafness, legally blind, and urinary incontinence.  Per report of EMS. In addition I did call the patient's mother, who did not answer her phone by left a message, the patient did not eat well this morning and has not been as interactive as normal. He is presently not responsive to attempts to communicate via gestures, touch, or sound.  No past medical history on file.  Past medical history includes severe mental retardation, deafness, cerebral palsy  There are no active problems to display for this patient.   No past surgical history on file.  Current Outpatient Rx  Name  Route  Sig  Dispense  Refill  . ARIPiprazole (ABILIFY) 2 MG tablet   Oral   Take 1 mg by mouth daily.         . clonazePAM (KLONOPIN) 0.5 MG tablet   Oral   Take 0.25 mg by mouth 3 (three) times daily.         . Ferrous Sulfate (IRON) 142 (45 FE) MG TBCR   Oral   Take 45 mg by mouth 2 (two) times a week.         . fexofenadine (ALLEGRA) 180 MG tablet   Oral   Take 180 mg by mouth daily as needed for allergies or rhinitis.         Marland Kitchen FLUoxetine (PROZAC) 20 MG capsule   Oral   Take 20 mg by mouth daily.         . fluticasone (FLONASE) 50 MCG/ACT nasal spray   Each Nare   Place 2 sprays into both nostrils at bedtime.         . furosemide (LASIX) 20 MG tablet   Oral   Take 20 mg by mouth daily as needed.         Marland Kitchen guaiFENesin (ROBITUSSIN) 100 MG/5ML liquid   Oral   Take 100 mg by mouth every 6 (six) hours as needed for cough.         . potassium chloride (K-DUR) 10  MEQ tablet   Oral   Take 10 mEq by mouth daily.         . tamsulosin (FLOMAX) 0.4 MG CAPS capsule   Oral   Take 1 capsule by mouth daily.         Marland Kitchen dutasteride (AVODART) 0.5 MG capsule   Oral   Take 1 capsule by mouth daily.         . hydrocortisone (ANUSOL-HC) 25 MG suppository   Rectal   Place 25 mg rectally every 12 (twelve) hours as needed.         . Multiple Vitamin (MULTI-VITAMINS) TABS   Oral   Take 1 tablet by mouth daily.         Marland Kitchen omeprazole (PRILOSEC) 20 MG capsule   Oral   Take 20 mg by mouth 2 (two) times daily.           Allergies Risperidone and Levofloxacin  No family history on file.  Social History History  Substance Use Topics  . Smoking status: Never Smoker   . Smokeless tobacco: Not on file  .  Alcohol Use: No    Review of Systems Unavailable. EM caveat ____________________________________________   PHYSICAL EXAM:  VITAL SIGNS: ED Triage Vitals  Enc Vitals Group     BP 12/14/14 0910 115/65 mmHg     Pulse Rate 12/14/14 0910 63     Resp 12/14/14 0910 20     Temp 12/14/14 0910 97.5 F (36.4 C)     Temp Source 12/14/14 0910 Axillary     SpO2 12/14/14 0910 100 %     Weight 12/14/14 0910 90 lb (40.824 kg)     Height --      Head Cir --      Peak Flow --      Pain Score --      Pain Loc --      Pain Edu? --      Excl. in [place]? --     Constitutional: The patient is unresponsive, but appears in no distress. He will occasionally move and grab at his armband, he allows [place] to examine him, and appears to be in no acute distress. Eyes: Appears to have cataracts bilaterally, his gaze is disconjugate. Pupils are midline bilaterally Head: Atraumatic. When the oropharynx is examined, the patient does bite down.  He appears to be protecting his airway well. Nose: No congestion/rhinnorhea. Mouth/Throat: Mucous membranes are moist.  Oropharynx non-erythematous. Edentulous. Neck: No stridor.   Cardiovascular: Normal rate, regular rhythm.  Grossly normal heart sounds.  Good peripheral circulation. Respiratory: Normal respiratory effort.  No retractions. Lungs CTAB. Gastrointestinal: Soft and nontender. No distention. No abdominal bruits. No CVA tenderness. Patient rolled on his backside, his perineum buttock and back are all normal in appearance without erythema or redness. Musculoskeletal: No lower extremity tenderness nor edema.  No joint effusions. He does have some muscular rigidity in all extremities, but overall range of motion is not significantly impeded. Neurologic:  Unable to perform. Overall, patient is sedate/obtunded. Yet in no distress. He does occasionally move his arms and pick at wristband, he makes occasional purposeful movements and occasionally claps his hand. Skin:  Skin is warm, dry and intact. No rash noted.  ____________________________________________   LABS (all labs ordered are listed, but only abnormal results are displayed)  Labs Reviewed  URINALYSIS COMPLETEWITH MICROSCOPIC ([place]) - Abnormal; Notable for the following:    Color, Urine YELLOW (*)    APPearance CLEAR (*)    Specific Gravity, Urine 1.004 (*)    Hgb urine dipstick 1+ (*)    Bacteria, UA RARE (*)    Squamous Epithelial / LPF 0-5 (*)    All other components within normal limits  CBC WITH DIFFERENTIAL/PLATELET  COMPREHENSIVE METABOLIC PANEL  LIPASE, BLOOD  AMMONIA  GLUCOSE, CAPILLARY  BLOOD GAS, VENOUS  CBG MONITORING, ED  CBG MONITORING, ED   ____________________________________________  EKG  ED ECG REPORT I, Glorious Flicker, the attending physician, personally viewed and interpreted this ECG.  Date: 12/14/2014 EKG Time: 911 Rate: 65 Rhythm: normal sinus rhythm, occasional fusion complex QRS Axis: Septal Q-wave Intervals: normal ST/T Wave abnormalities: normal Conduction Disutrbances: none Narrative Interpretation: Sinus rhythm, occasional fusion, septal Q-wave. No evidence of acute ST  elevation  ____________________________________________  RADIOLOGY  DG Chest Port 1 View (Final result) Result time: 12/14/14 10:55:01   Final result by Rad Results In Interface (12/14/14 10:55:01)   Narrative:   CLINICAL DATA: Altered mental status/confusion  EXAM: PORTABLE CHEST - 1 VIEW  COMPARISON: December 08, 2009  FINDINGS: There is focal airspace consolidation in the left  base. Lungs elsewhere clear. Heart size and pulmonary vascularity are normal. No adenopathy. There is upper thoracic levoscoliosis.  IMPRESSION: Left base airspace consolidation consistent with pneumonia. Lungs otherwise clear. Followup PA and lateral chest radiographs recommended in 3-4 weeks following trial of antibiotic therapy to ensure resolution and exclude underlying malignancy.   Electronically Signed By: Lowella Grip III M.D. On: 12/14/2014 10:55          CT Abdomen Pelvis Wo Contrast (Final result) Result time: 12/14/14 10:46:06   Final result by Rad Results In Interface (12/14/14 10:46:06)   Narrative:   CLINICAL DATA: Altered mental status. History of cerebral palsy.  EXAM: CT ABDOMEN AND PELVIS WITHOUT CONTRAST  TECHNIQUE: Multidetector CT imaging of the abdomen and pelvis was performed following the standard protocol without IV contrast.  COMPARISON: 03/20/2009 and plain films of 02/13/2014.  FINDINGS: Lower chest: Left base volume loss. Mild left worse than right gynecomastia. Mild cardiomegaly. A moderate hiatal hernia.  Hepatobiliary: Multifactorial degradation, including patient arm position and overlying EKG lead artifact. Normal liver. Cholecystectomy, without biliary ductal dilatation.  Pancreas: Normal, without mass or ductal dilatation.  Spleen: Normal  Adrenals/Urinary Tract: Normal adrenal glands. Right extrarenal pelvis. An upper pole left renal lesion is most apparent on coronal image 82. Measures 7 mm and approximately 56 HU. Not  readily apparent on the prior. Multiple left renal collecting system calculi, on the order of 8 mm or less. Bilateral renal cortical thinning is mild. Mild to moderate left-sided hydronephrosis secondary to a 4 mm stone at the left ureteropelvic junction. No distal ureteric stones. No bladder calculi.  Stomach/Bowel: Normal distal stomach. Colonic stool burden suggests constipation. Surgical site sugars in the region of the ileocecal junction.  Vascular/Lymphatic: Normal caliber of the aorta and branch vessels. No abdominopelvic adenopathy.  Reproductive: Prostatic calcifications.  Other: No significant free fluid.  Musculoskeletal: Osteopenia. Moderate convex left lumbar spine curvature.  IMPRESSION: 1. Mild to moderate left-sided hydroureteronephrosis secondary to a 4 mm left ureteropelvic junction stone. 2. Otherwise, multifactorial degradation as detailed above. 3. Left nephrolithiasis with bilateral renal cortical thinning. 4. 7 mm upper pole left renal lesion which is indeterminate. Given mild hyperattenuation, a hemorrhagic cyst is favored. Although the most definitive characterization would be with pre and post contrast abdominal MRI, given patient's clinical status, ultrasound followup at 1 year is a suggested strategy. 5. Moderate hiatal hernia. 6. Bilateral gynecomastia. 7. Possible constipation.    Chest x-ray shows a questionable left air space of consolidation on the base. This is not demonstrated on CT. In addition, patient is afebrile, no rales on exam, normal white count, with no recent infectious symptoms. I suspect at this point that he likely does not have clinical pneumonia. ____________________________________________   PROCEDURES  Procedure(s) performed: None  Critical Care performed: Yes, see critical care note(s)  CRITICAL CARE Performed by: Delman Kitten   Total critical care time: 35  Critical care time was exclusive of separately billable  procedures and treating other patients.  Critical care was necessary to treat or prevent imminent or life-threatening deterioration.  Critical care was time spent personally by me on the following activities: development of treatment plan with patient and/or surrogate as well as nursing, discussions with consultants, evaluation of patient's response to treatment, examination of patient, obtaining history from patient or surrogate, ordering and performing treatments and interventions, ordering and review of laboratory studies, ordering and review of radiographic studies, pulse oximetry and re-evaluation of patient's condition.  Patient presents with unresponsiveness, GCS is  less than 13. The patient required immediate evaluation by myself and complex decision making in consideration of potential diagnosis, including acute intracranial hemorrhage, severe metabolic encephalopathy, etc.  ____________________________________________   INITIAL IMPRESSION / ASSESSMENT AND PLAN / ED COURSE  Pertinent labs & imaging results that were available during my care of the patient were reviewed by me and considered in my medical decision making (see chart for details).  Patient presents with acute change in mental status. I'm unable to collect history from the patient's next of kin. I attempted to call the one number listed and did not receive a call back yet. Per EMS the patient was more weak this morning, not eating well, and brought to the ER for evaluation of acute mental status change.  Reassuringly, the patient is calm with stable vital signs, and does not appear to be in any acute distress. However, his mental status seems to have changed from his baseline. I do not believe that he is any acute in extremis, but I am concerned that the patient may have developed encephalopathy or delirium. Alternative diagnosis would include acute neurologic change including intracranial hemorrhage or stroke. There is no  evidence of acute myocardial infarction on EKG. We will obtain a broad differential of labs including infectious workup, metabolic workup, CT of the head, cardiac evaluation.  ----------------------------------------- 9:36 AM on 12/14/2014 -----------------------------------------  Patient's caretakers arrived. They note that at 7:00 when they began caring for him he didn't seem right, he was not eating his normal way, cannot bring in the fork to his mouth like he normally would, they don't know how he was when he woke up. They noted that while at the breakfast table he was clapping, and seem like he might be in pain. He was leaning and curling into a ball. They took him to his day program and he continued to become more sleepy, prompting them to call 911. Caretakers are also trying to reach out to the patient's healthcare power of attorney, listed as his mother on his paperwork. ____________________________________________ ----------------------------------------- 12:08 PM on 12/14/2014 -----------------------------------------  Patient essentially remains unresponsive. He is slightly bradycardic but otherwise stable vital signs and in no distress. His family is at bedside, if the patient is stimulated she will close his eyes and good his jaw slightly but he otherwise is not showing any signs of spontaneous movement. He does not appear in pain though.  Because of his clinical status, I anticipate admission. I did discuss with his family his CODE STATUS. His mother, sister, and brother are present. They all note that they would not want him maintained on any artificial life support. If his heart were to stop they would not want it restarted and if his breathing were to stop, they would not want a ventilator or beathing tube because of his already poor quality of life. He is DO NOT RESUSCITATE.  I discussed evaluation with his family. Personally, I still do not have a good examination for his  unresponsiveness. Is possible that he is any deep stable sleep, he does not take any obvious sedative medications. His labs CT are all fairly reassuring. He does have acute left-sided nephrolithiasis but did not appear to be in pain. He did discuss his CT with Dr. Tresa Moore (urology). I will admit the patient to the hospital service for further evaluation of his acute mental status change.  FINAL CLINICAL IMPRESSION(S) / ED DIAGNOSES  Final diagnoses:  Unresponsive state  Kidney stone on left side  Delman Kitten, MD 12/14/14 [phone]

## 2014-12-14 NOTE — Consult Note (Signed)
Reason for Consult: Left Large Volume Nephrolithaisis with Hydronephrosis  Referring Physician: Demetrios Loll MD  Shawn Sweeney is an 59 y.o. male.   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 UA s/o infectious parameters. No fevers or leukocytosis.   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 No blood thinners. His POA is mother and brother who are very involved in his care.  Today "[name] is seen in consultation for above.   Past Medical History  Diagnosis Date  . Hypertension     Past Surgical History  Procedure Laterality Date  . Colon surgery    . Diagnostic laparoscopy      No family history on file.  Social History:  reports that he has never smoked. He does not have any smokeless tobacco history on file. He reports that he does not use illicit drugs. His alcohol history is not on file.  Allergies:  Allergies  Allergen Reactions  . Risperidone Hives and Other (See Comments)  . Levofloxacin Other (See Comments) and Rash    Medications: I have reviewed the patient's current medications.  Results for orders placed or performed during the hospital encounter of 12/14/14 (from the past 48 hour(s))  CBC with Differential     Status: None   Collection Time: 12/14/14  9:42 AM  Result Value Ref Range   WBC 5.4 3.8 - 10.6 K/uL   RBC 4.46 4.40 - 5.90 MIL/uL   Hemoglobin 14.2 13.0 - 18.0 g/dL   HCT 43.2 40.0 - 52.0 %   MCV 96.9 80.0 - 100.0 fL   MCH 31.8 26.0 - 34.0 pg   MCHC 32.8 32.0 - 36.0 g/dL   RDW 12.8 11.5 - 14.5 %   Platelets 152 150 - 440 K/uL   Neutrophils Relative % 67 %   Neutro Abs 3.6 1.4 -  6.5 K/uL   Lymphocytes Relative 20 %   Lymphs Abs 1.1 1.0 - 3.6 K/uL   Monocytes Relative 8 %   Monocytes Absolute 0.4 0.2 - 1.0 K/uL   Eosinophils Relative 4 %   Eosinophils Absolute 0.2 0 - 0.7 K/uL   Basophils Relative 1 %   Basophils Absolute 0.0 0 - 0.1 K/uL  Comprehensive metabolic panel     Status: None   Collection Time: 12/14/14  9:42 AM  Result Value Ref Range   Sodium 139 135 - 145 mmol/L   Potassium 3.9 3.5 - 5.1 mmol/L   Chloride 105 101 - 111 mmol/L   CO2 26 22 - 32 mmol/L   Glucose, Bld 66 65 - 99 mg/dL   BUN 12 6 - 20 mg/dL   Creatinine, Ser 0.93 0.61 - 1.24 mg/dL   Calcium 9.1 8.9 - 10.3 mg/dL   Total Protein 7.3 6.5 - 8.1 g/dL   Albumin 3.7 3.5 - 5.0 g/dL   AST 19 15 - 41 U/L   ALT 18 17 - 63 U/L   Alkaline Phosphatase 75 38 - 126 U/L   Total Bilirubin 0.4 0.3 - 1.2 mg/dL   GFR calc non Af Amer >60 >60 mL/min   GFR calc Af Amer >60 >60 mL/min  Comment: (NOTE) The eGFR has been calculated using the CKD EPI equation. This calculation has not been validated in all clinical situations. eGFR's persistently <60 mL/min signify possible Chronic Kidney Disease.    Anion gap 8 5 - 15  Lipase, blood     Status: None   Collection Time: 12/14/14  9:42 AM  Result Value Ref Range   Lipase 46 22 - 51 U/L  Ammonia     Status: None   Collection Time: 12/14/14  9:42 AM  Result Value Ref Range   Ammonia 29 9 - 35 umol/L    Comment: HEMOLYSIS AT THIS LEVEL MAY AFFECT RESULT  Glucose, capillary     Status: None   Collection Time: 12/14/14  9:43 AM  Result Value Ref Range   Glucose-Capillary 82 65 - 99 mg/dL  Urinalysis complete, with microscopic ([facility] only)     Status: Abnormal   Collection Time: 12/14/14 10:48 AM  Result Value Ref Range   Color, Urine YELLOW (A) YELLOW   APPearance CLEAR (A) CLEAR   Glucose, UA NEGATIVE NEGATIVE mg/dL   Bilirubin Urine NEGATIVE NEGATIVE   Ketones, ur NEGATIVE NEGATIVE mg/dL   Specific Gravity, Urine 1.004 (L) 1.005 - 1.030    Hgb urine dipstick 1+ (A) NEGATIVE   pH 6.0 5.0 - 8.0   Protein, ur NEGATIVE NEGATIVE mg/dL   Nitrite NEGATIVE NEGATIVE   Leukocytes, UA NEGATIVE NEGATIVE   RBC / HPF NONE SEEN 0 - 5 RBC/hpf   WBC, UA 0-5 0 - 5 WBC/hpf   Bacteria, UA RARE (A) NONE SEEN   Squamous Epithelial / LPF 0-5 (A) NONE SEEN  Glucose, capillary     Status: None   Collection Time: 12/14/14 12:15 PM  Result Value Ref Range   Glucose-Capillary 82 65 - 99 mg/dL    Ct Abdomen Pelvis Wo Contrast  12/14/2014   CLINICAL DATA:  Altered mental status.  History of cerebral palsy.  EXAM: CT ABDOMEN AND PELVIS WITHOUT CONTRAST  TECHNIQUE: Multidetector CT imaging of the abdomen and pelvis was performed following the standard protocol without IV contrast.  COMPARISON:  03/20/2009 and plain films of 02/13/2014.  FINDINGS: Lower chest: Left base volume loss. Mild left worse than right gynecomastia. Mild cardiomegaly. A moderate hiatal hernia.  Hepatobiliary: Multifactorial degradation, including patient arm position and overlying EKG lead artifact. Normal liver. Cholecystectomy, without biliary ductal dilatation.  Pancreas: Normal, without mass or ductal dilatation.  Spleen: Normal  Adrenals/Urinary Tract: Normal adrenal glands. Right extrarenal pelvis. An upper pole left renal lesion is most apparent on coronal image 82. Measures 7 mm and approximately 56 HU. Not readily apparent on the prior. Multiple left renal collecting system calculi, on the order of 8 mm or less. Bilateral renal cortical thinning is mild. Mild to moderate left-sided hydronephrosis secondary to a 4 mm stone at the left ureteropelvic junction. No distal ureteric stones. No bladder calculi.  Stomach/Bowel: Normal distal stomach. Colonic stool burden suggests constipation. Surgical site sugars in the region of the ileocecal junction.  Vascular/Lymphatic: Normal caliber of the aorta and branch vessels. No abdominopelvic adenopathy.  Reproductive: Prostatic calcifications.   Other: No significant free fluid.  Musculoskeletal: Osteopenia. Moderate convex left lumbar spine curvature.  IMPRESSION: 1. Mild to moderate left-sided hydroureteronephrosis secondary to a 4 mm left ureteropelvic junction stone. 2. Otherwise, multifactorial degradation as detailed above. 3. Left nephrolithiasis with bilateral renal cortical thinning. 4. 7 mm upper pole left renal lesion which is indeterminate. Given mild hyperattenuation, a hemorrhagic cyst is  favored. Although the most definitive characterization would be with pre and post contrast abdominal MRI, given patient's clinical status, ultrasound followup at 1 year is a suggested strategy. 5. Moderate hiatal hernia. 6. Bilateral gynecomastia. 7.  Possible constipation.   Electronically Signed   By: Abigail Miyamoto M.D.   On: 12/14/2014 10:46   Ct Head Wo Contrast  12/14/2014   CLINICAL DATA:  Altered mental status, and decreased alertness in activity, not eating, history cerebral palsy, deafness, blindness  EXAM: CT HEAD WITHOUT CONTRAST  TECHNIQUE: Contiguous axial images were obtained from the base of the skull through the vertex without intravenous contrast.  COMPARISON:  12/08/2009  FINDINGS: Generalized atrophy.  Ex vacuo dilatation of the ventricular system, stable, consistent with degree of atrophy.  No midline shift or mass effect.  Stable appearance of brain parenchyma.  No evidence of mass lesion, intracranial hemorrhage or acute infarction.  Scattered dural calcifications in falx.  Partial opacification of RIGHT ethmoid air cells.  No acute osseous or sinus abnormalities otherwise seen.  IMPRESSION: Chronic atrophy and ventriculomegaly, stable.  No acute abnormalities.   Electronically Signed   By: Lavonia Dana M.D.   On: 12/14/2014 10:29   Dg Chest Port 1 View  12/14/2014   CLINICAL DATA:  Altered mental status/confusion  EXAM: PORTABLE CHEST - 1 VIEW  COMPARISON:  December 08, 2009  FINDINGS: There is focal airspace consolidation in the left  base. Lungs elsewhere clear. Heart size and pulmonary vascularity are normal. No adenopathy. There is upper thoracic levoscoliosis.  IMPRESSION: Left base airspace consolidation consistent with pneumonia. Lungs otherwise clear. Followup PA and lateral chest radiographs recommended in 3-4 weeks following trial of antibiotic therapy to ensure resolution and exclude underlying malignancy.   Electronically Signed   By: Lowella Grip III M.D.   On: 12/14/2014 10:55    Review of Systems  Unable to perform ROS: medical condition  Constitutional: Negative for fever.   Blood pressure 131/64, pulse 57, temperature 97.8 F (36.6 C), temperature source Oral, resp. rate 18, weight 91 lb (41.277 kg), SpO2 100 %. Physical Exam  Constitutional:  Thin, stigmata of advanced cerebral palsey but very well cared for. Wife, brother, and caretaker at bedside. Pt non-verbal but non-combative and generally cooperative.   HENT:  Head: Normocephalic and atraumatic.  Eyes: Pupils are equal, round, and reactive to light.  Neck: Normal range of motion.  Cardiovascular: Normal rate.   Respiratory: Effort normal.  GI: Soft.  Prior surgical scars c/d/i. No hernais. Non-obese.   Genitourinary: Penis normal.  No CVAT. Preserved window to left flank with lateral positionaing despite contractures.   Musculoskeletal:  UE and LE contractures, most of which are soft.   Neurological:  AO x 0.   Skin: Skin is warm.    Assessment/Plan:   1-  Left Large Volume Nephrolithaisis with Hydronephrosis - Cr normal and no GU infectious parameters. Unclear if current stone related to some mild mental status changes. Discuss with family options including  A - Aggressive Curative - Intent Management : Place left neph tube this admission, then would need left percutaneous nephrostolithotomy in elective setting, preferably in [place] with goal of stone free. This would address his small obstructing stone but also his considerable  ipsilateral stone burden which has been problematic x years.  B - Palliative Intent Management: no curative- intent surgery, comfort care meds only, understanding that his obstruction and stone may lead to recurrent UTI, even urosepsis, and likely ipsilateral renal demise.  Family  desires curative-intent management and this is in keeping with prior goals of care. I agree.  Will make NPO and reqeust left neph tube by IR this admission. OK to DC after neph tube placement and back to medical baselien. We will arrange for GU followup in outpatient setting.    Shawn Sweeney 12/14/2014, 3:46 PM

## 2014-12-14 NOTE — ED Notes (Signed)
Caretaker states pt ususally does not sleep this much during the day

## 2014-12-15 ENCOUNTER — Inpatient Hospital Stay: Payer: Medicare Other

## 2014-12-15 DIAGNOSIS — R001 Bradycardia, unspecified: Secondary | ICD-10-CM | POA: Diagnosis present

## 2014-12-15 DIAGNOSIS — N400 Enlarged prostate without lower urinary tract symptoms: Secondary | ICD-10-CM | POA: Diagnosis present

## 2014-12-15 DIAGNOSIS — N200 Calculus of kidney: Secondary | ICD-10-CM | POA: Diagnosis present

## 2014-12-15 DIAGNOSIS — I959 Hypotension, unspecified: Secondary | ICD-10-CM | POA: Diagnosis present

## 2014-12-15 DIAGNOSIS — H913 Deaf nonspeaking, not elsewhere classified: Secondary | ICD-10-CM | POA: Diagnosis present

## 2014-12-15 DIAGNOSIS — N132 Hydronephrosis with renal and ureteral calculous obstruction: Secondary | ICD-10-CM | POA: Diagnosis present

## 2014-12-15 DIAGNOSIS — G809 Cerebral palsy, unspecified: Secondary | ICD-10-CM | POA: Diagnosis present

## 2014-12-15 DIAGNOSIS — Z85038 Personal history of other malignant neoplasm of large intestine: Secondary | ICD-10-CM | POA: Diagnosis not present

## 2014-12-15 DIAGNOSIS — E876 Hypokalemia: Secondary | ICD-10-CM | POA: Diagnosis present

## 2014-12-15 DIAGNOSIS — H548 Legal blindness, as defined in USA: Secondary | ICD-10-CM | POA: Diagnosis present

## 2014-12-15 DIAGNOSIS — Z800 Family history of malignant neoplasm of digestive organs: Secondary | ICD-10-CM | POA: Diagnosis not present

## 2014-12-15 DIAGNOSIS — Z66 Do not resuscitate: Secondary | ICD-10-CM | POA: Diagnosis present

## 2014-12-15 DIAGNOSIS — H543 Unqualified visual loss, both eyes: Secondary | ICD-10-CM | POA: Insufficient documentation

## 2014-12-15 DIAGNOSIS — F39 Unspecified mood [affective] disorder: Secondary | ICD-10-CM | POA: Diagnosis present

## 2014-12-15 DIAGNOSIS — I10 Essential (primary) hypertension: Secondary | ICD-10-CM | POA: Diagnosis present

## 2014-12-15 DIAGNOSIS — Z8249 Family history of ischemic heart disease and other diseases of the circulatory system: Secondary | ICD-10-CM | POA: Diagnosis not present

## 2014-12-15 DIAGNOSIS — Z79899 Other long term (current) drug therapy: Secondary | ICD-10-CM | POA: Diagnosis not present

## 2014-12-15 DIAGNOSIS — N202 Calculus of kidney with calculus of ureter: Secondary | ICD-10-CM | POA: Diagnosis present

## 2014-12-15 DIAGNOSIS — R410 Disorientation, unspecified: Secondary | ICD-10-CM | POA: Diagnosis not present

## 2014-12-15 DIAGNOSIS — H905 Unspecified sensorineural hearing loss: Secondary | ICD-10-CM | POA: Diagnosis present

## 2014-12-15 DIAGNOSIS — Z888 Allergy status to other drugs, medicaments and biological substances status: Secondary | ICD-10-CM | POA: Diagnosis not present

## 2014-12-15 DIAGNOSIS — J189 Pneumonia, unspecified organism: Secondary | ICD-10-CM | POA: Diagnosis present

## 2014-12-15 DIAGNOSIS — F72 Severe intellectual disabilities: Secondary | ICD-10-CM | POA: Diagnosis present

## 2014-12-15 DIAGNOSIS — Z9049 Acquired absence of other specified parts of digestive tract: Secondary | ICD-10-CM | POA: Diagnosis present

## 2014-12-15 DIAGNOSIS — R4182 Altered mental status, unspecified: Secondary | ICD-10-CM | POA: Diagnosis present

## 2014-12-15 DIAGNOSIS — Z8042 Family history of malignant neoplasm of prostate: Secondary | ICD-10-CM | POA: Diagnosis not present

## 2014-12-15 DIAGNOSIS — H547 Unspecified visual loss: Secondary | ICD-10-CM | POA: Diagnosis present

## 2014-12-15 DIAGNOSIS — Z87442 Personal history of urinary calculi: Secondary | ICD-10-CM | POA: Diagnosis not present

## 2014-12-15 DIAGNOSIS — Z7951 Long term (current) use of inhaled steroids: Secondary | ICD-10-CM | POA: Diagnosis not present

## 2014-12-15 MED ORDER — FENTANYL CITRATE (PF) 100 MCG/2ML IJ SOLN
INTRAMUSCULAR | Status: AC
  Administered 2014-12-15: 19:00:00
  Filled 2014-12-15: qty 4

## 2014-12-15 MED ORDER — MIDAZOLAM HCL 5 MG/5ML IJ SOLN
INTRAMUSCULAR | Status: AC
  Administered 2014-12-15: 19:00:00
  Filled 2014-12-15: qty 5

## 2014-12-15 MED ORDER — AZITHROMYCIN 500 MG IV SOLR
250.0000 mg | INTRAVENOUS | Status: DC
  Administered 2014-12-15 – 2014-12-16 (×2): 250 mg via INTRAVENOUS
  Filled 2014-12-15 (×4): qty 250

## 2014-12-15 MED ORDER — LIDOCAINE HCL (PF) 1 % IJ SOLN
INTRAMUSCULAR | Status: DC | PRN
  Administered 2014-12-15: 10 mL

## 2014-12-15 MED ORDER — IOHEXOL 300 MG/ML  SOLN
30.0000 mL | Freq: Once | INTRAMUSCULAR | Status: AC | PRN
  Administered 2014-12-15: 5 mL

## 2014-12-15 MED ORDER — SODIUM CHLORIDE 0.9 % IV SOLN
INTRAVENOUS | Status: DC
  Administered 2014-12-15 – 2014-12-17 (×6): via INTRAVENOUS

## 2014-12-15 MED ORDER — LIDOCAINE HCL (PF) 1 % IJ SOLN
INTRAMUSCULAR | Status: AC
  Filled 2014-12-15: qty 10

## 2014-12-15 MED ORDER — CEFTRIAXONE SODIUM IN DEXTROSE 20 MG/ML IV SOLN
1.0000 g | INTRAVENOUS | Status: DC
  Administered 2014-12-15 – 2014-12-17 (×3): 1 g via INTRAVENOUS
  Filled 2014-12-15 (×4): qty 50

## 2014-12-15 MED ORDER — FENTANYL CITRATE (PF) 100 MCG/2ML IJ SOLN
INTRAMUSCULAR | Status: AC | PRN
  Administered 2014-12-15: 25 ug via INTRAVENOUS

## 2014-12-15 MED ORDER — MIDAZOLAM HCL 5 MG/5ML IJ SOLN
INTRAMUSCULAR | Status: AC | PRN
  Administered 2014-12-15: 1 mg via INTRAVENOUS

## 2014-12-15 NOTE — Progress Notes (Signed)
Initial Nutrition Assessment       INTERVENTION:   (Meals and snacks: Once diet progressed will cater to pt prefences)  NUTRITION DIAGNOSIS:  Inadequate oral intake related to inability to eat as evidenced by NPO status.    GOAL:  Patient will meet greater than or equal to 90% of their needs    MONITOR:   (Energy intake)  REASON FOR ASSESSMENT:   (low BMI)    ASSESSMENT:  Pt admitted with AMS, planning possible left nephrostomy tube placement today. Neurology to evaluate today  PMHx:  Past Medical History  Diagnosis Date  . Hypertension   . Nephrolithiasis   . Cerebral palsy   . Congenital deafness   . BPH (benign prostatic hyperplasia)   . Adenocarcinoma of cecum   . Anemia   . Blind   . Mood disorder   . Onychomycosis     Current Nutrition: NPO. Brother, Scientist, physiological at bedside and reports pt ate 100% of supper last night but had to be fed   Food/Nutrition-Related History: Brother reports good appetite prior to admission   Labs:  Electrolyte and Renal Profile:  Recent Labs Lab 12/14/14 0942  BUN 12  CREATININE 0.93  NA 139  K 3.9    Medications: Fe sulfate   Digestive system: 6/16 last BM noted   Physical Findings:  Unable to complete Nutrition-Focused physical exam at this time.    Weight Change: Brother reports stable weight prior to admission   Height:  Ht Readings from Last 1 Encounters:  12/14/14 5' (1.524 m)    Weight:  Wt Readings from Last 1 Encounters:  12/14/14 91 lb (41.277 kg)        Wt Readings from Last 10 Encounters:  12/14/14 91 lb (41.277 kg)    BMI:  Body mass index is 17.77 kg/(m^2).     Skin:  Reviewed, no issues  Diet Order:  Diet NPO time specified Except for: Sips with Meds  EDUCATION NEEDS:  No education needs identified at this time    LOW Care Level Leanore Biggers B. Zenia Resides, [place], [place] (pager)

## 2014-12-15 NOTE — Clinical Social Work Note (Signed)
Clinical Social Work Assessment  Patient Details  Name: Shawn Sweeney MRN: [id number] Date of Birth: 1955/10/01  Date of referral:  12/15/14               Reason for consult:  Facility Placement (pt is from [facility] group home in [place])                Permission sought to share information with:  Customer service manager, Family Supports Permission granted to share information::  No (pt is no verbal and did not respond to CSW)  Name::        Agency::     Relationship::     Contact Information:     Housing/Transportation Living arrangements for the past 2 months:  Group Home Source of Information:  Other (Comment Required) (pt's brother and HCPOA- Shawn Sweeney) [phone]) Patient Interpreter Needed:  None Criminal Activity/Legal Involvement Pertinent to Current Situation/Hospitalization:  No - Comment as needed Significant Relationships:  Siblings Lives with:  Facility Resident Do you feel safe going back to the place where you live?    Need for family participation in patient care:  Yes (Comment)  Care giving concerns:  None expressed at this time.  Family is in agreement with pt's DC back to group home once he is medically stable and unless otherwise recommended.   Social Worker assessment / plan:  CSW spoke to pt's brother.  He is in the process of becoming the legal guardian for the pt.  He is also in agreement with DC back to group home once pt is medically stable.  Employment status:  Disabled (Comment on whether or not currently receiving Disability) Insurance information:    PT Recommendations:    Information / Referral to community resources:     Patient/Family's Response to care:  in agreement with DC back to group home once pt is medically stable.  Patient/Family's Understanding of and Emotional Response to Diagnosis, Current Treatment, and Prognosis:  Pt's brother understood this DC plan and is in agreement with it.  Emotional  Assessment Appearance:    Attitude/Demeanor/Rapport:    Affect (typically observed):    Orientation:    Alcohol / Substance use:    Psych involvement (Current and /or in the community):  No (Comment)  Discharge Needs  Concerns to be addressed:    Readmission within the last 30 days:    Current discharge risk:    Barriers to Discharge:      Mathews Argyle, LCSW 12/15/2014, 2:46 PM

## 2014-12-15 NOTE — Progress Notes (Signed)
Patient ID: Shawn Sweeney, male   DOB: 1955/09/09, 59 y.o.   MRN: [id number] SUBJECTIVE:  Pt with h/o cerebral palsy with spasticity, congenital deafness, blindness, with prior h/o colon cancer, presented with unresponsiveness.  Pt not verbally communicative at baseline, though is able to indicate pain/discomfort, and family had not noted any indication of this recently.  Noted to have bradycardia/hypotension.  Imaging reveals possible left pneumonia as well as left kidney stone with hydronephrosis.  No fever, chills; no N/V/D noted.  Family reports pt has been eating well  ______________________________________________________________________  ROS: Please see HPI; pt unable to provide ROS  Past Medical History  Diagnosis Date  . Hypertension   . Nephrolithiasis   . Cerebral palsy   . Congenital deafness   . BPH (benign prostatic hyperplasia)   . Adenocarcinoma of cecum   . Anemia   . Blind   . Mood disorder   . Onychomycosis     Past Surgical History  Procedure Laterality Date  . Colon surgery    . Diagnostic laparoscopy    . Cholecystectomy    . Colectomy       Current facility-administered medications:  .  0.9 %  sodium chloride infusion, 250 mL, Intravenous, PRN, Demetrios Loll, MD .  0.9 %  sodium chloride infusion, , Intravenous, Continuous, Tama High III, MD .  acetaminophen (TYLENOL) tablet 650 mg, 650 mg, Oral, Q6H PRN **OR** acetaminophen (TYLENOL) suppository 650 mg, 650 mg, Rectal, Q6H PRN, Demetrios Loll, MD .  albuterol (PROVENTIL) (2.5 MG/3ML) 0.083% nebulizer solution 2.5 mg, 2.5 mg, Nebulization, Q2H PRN, Demetrios Loll, MD .  ARIPiprazole (ABILIFY) tablet 1 mg, 1 mg, Oral, Daily, Demetrios Loll, MD, 1 mg at 12/14/14 1554 .  azithromycin (ZITHROMAX) 250 mg in dextrose 5 % 125 mL IVPB, 250 mg, Intravenous, Q24H, Tama High III, MD .  cefTRIAXone (ROCEPHIN) 1 g in dextrose 5 % 50 mL IVPB - Premix, 1 g, Intravenous, Q24H, Tama High III, MD .  clonazePAM [facility]bye [place]) tablet  0.25 mg, 0.25 mg, Oral, TID, Demetrios Loll, MD, 0.25 mg at 12/14/14 2110 .  dutasteride (AVODART) capsule 0.5 mg, 0.5 mg, Oral, Daily, Demetrios Loll, MD, 0.5 mg at 12/14/14 1556 .  ferrous sulfate tablet 325 mg, 325 mg, Oral, Daily, Demetrios Loll, MD, 325 mg at 12/14/14 1558 .  FLUoxetine (PROZAC) capsule 20 mg, 20 mg, Oral, Daily, Demetrios Loll, MD, 20 mg at 12/14/14 1557 .  fluticasone (FLONASE) 50 MCG/ACT nasal spray 2 spray, 2 spray, Each Nare, QHS, Demetrios Loll, MD, 2 spray at 12/14/14 2111 .  loratadine (CLARITIN) tablet 10 mg, 10 mg, Oral, Daily, Demetrios Loll, MD, 10 mg at 12/14/14 1557 .  ondansetron (ZOFRAN) tablet 4 mg, 4 mg, Oral, Q6H PRN **OR** ondansetron (ZOFRAN) injection 4 mg, 4 mg, Intravenous, Q6H PRN, Demetrios Loll, MD .  pantoprazole (PROTONIX) EC tablet 40 mg, 40 mg, Oral, Daily, Demetrios Loll, MD, 40 mg at 12/14/14 1558 .  sodium chloride 0.9 % injection 3 mL, 3 mL, Intravenous, Q12H, Demetrios Loll, MD, 3 mL at 12/14/14 2118 .  sodium chloride 0.9 % injection 3 mL, 3 mL, Intravenous, PRN, Demetrios Loll, MD .  tamsulosin [facility]) capsule 0.4 mg, 0.4 mg, Oral, Daily, Demetrios Loll, MD, 0.4 mg at 12/14/14 1558  PHYSICAL EXAM:  BP 92/49 mmHg  Pulse 64  Temp(Src) 98.9 F (37.2 C) (Oral)  Resp 16  Ht 5' (1.524 m)  Wt 41.277 kg (91 lb)  BMI 17.77 kg/m2  SpO2 98%  General: Thin  male, in fetal position, in NAD HEENT: pupils minimally reactive; bilateral blindness; OP difficult to examine Neck: supple, trachea midline, no thyromegaly Chest: normal to palpation Lungs: decreased airflow without retractions or wheezes Cardiovascular: RRR,  no gallop; distal pulses 1+ Abdomen: soft, nontender, nondistended, positive bowel sounds Extremities: no clubbing, cyanosis, edema Neuro: diffuse spasticity Derm: no significant rashes or nodules; decreased skin turgor Lymph: no cervical or supraclavicular lymphadenopathy  Labs and imaging studies were reviewed  ASSESSMENT/PLAN:   1.  AMS- etiology unclear; neuro to see.   Adding abx coverage for possible pneumonia, though no fever, no leukocytosis.  Some cough.  Follow telemetry and add IVF, given bradycardia and hypotension.  Echo arranged.  Check troponin 2. Renal stone- nephrology to see; does have hydronephrosis, but no other overt signs of discomfort from this, and pt is able to show when he is in pain 3. Mood disorder.  Cont home medications.  ;

## 2014-12-15 NOTE — Plan of Care (Signed)
Problem: Consults Goal: General Medical Patient Education See Patient Education Module for specific education.  Outcome: Not Met (add Reason) Pt is blind and deaf     

## 2014-12-15 NOTE — Consult Note (Signed)
CC: AMS  HPI: Shawn Sweeney is an 59 y.o. male . with a known history of Nephrolithiasis, Cerebral palsy, Congenital deafness, BPH, mood disorder, adenocarcinoma of the cecum, anemia and onychomycosis. The patient is nonverbal. The patient was sent from home. According to patient's sister and the health care giver, the patient was fine until this morning.  Possibility of L renal sone and L PNA which is being treated.  Mental status improved as per family.    Past Medical History  Diagnosis Date  . Hypertension   . Nephrolithiasis   . Cerebral palsy   . Congenital deafness   . BPH (benign prostatic hyperplasia)   . Adenocarcinoma of cecum   . Anemia   . Blind   . Mood disorder   . Onychomycosis     Past Surgical History  Procedure Laterality Date  . Colon surgery    . Diagnostic laparoscopy    . Cholecystectomy    . Colectomy      Family History  Problem Relation Age of Onset  . CAD Father   . Prostate cancer Father   . Colon cancer Father     Social History:  reports that he has never smoked. He does not have any smokeless tobacco history on file. He reports that he does not drink alcohol or use illicit drugs.  Allergies  Allergen Reactions  . Risperidone Hives and Other (See Comments)  . Levofloxacin Other (See Comments) and Rash    Medications: I have reviewed the patient's current medications.  ROS: Unable to obtain as pt is non verbal at [place]  Physical Examination: Blood pressure 116/60, pulse 52, temperature 97.8 F (36.6 C), temperature source Oral, resp. rate 14, height 5' (1.524 m), weight 41.277 kg (91 lb), SpO2 99 %.  Pt is non verbal, blind, does not follow commands.  Appears to be moving b/l UE symmetrically, weak in b/l LE since he is wheelchair bound   Laboratory Studies:   Basic Metabolic Panel:  Recent Labs Lab 12/14/14 0942  NA 139  K 3.9  CL 105  CO2 26  GLUCOSE 66  BUN 12  CREATININE 0.93  CALCIUM 9.1    Liver Function  Tests:  Recent Labs Lab 12/14/14 0942  AST 19  ALT 18  ALKPHOS 75  BILITOT 0.4  PROT 7.3  ALBUMIN 3.7    Recent Labs Lab 12/14/14 0942  LIPASE 46    Recent Labs Lab 12/14/14 0942  AMMONIA 29    CBC:  Recent Labs Lab 12/14/14 0942  WBC 5.4  NEUTROABS 3.6  HGB 14.2  HCT 43.2  MCV 96.9  PLT 152    Cardiac Enzymes: No results for input(s): CKTOTAL, CKMB, CKMBINDEX, TROPONINI in the last 168 hours.  BNP: Invalid input(s): POCBNP  CBG:  Recent Labs Lab 12/14/14 0943 12/14/14 1215  GLUCAP 82 21    Microbiology: Results for orders placed or performed in visit on 10/26/13  Urine culture     Status: None   Collection Time: 10/27/13  7:30 AM  Result Value Ref Range Status   Micro Text Report   Final       SOURCE: CLEAN CATCH    ORGANISM 1                30,000 CFU/ML ESCHERICHIA COLI   ANTIBIOTIC                    ORG#1     AMPICILLIN  R         CEFAZOLIN                     S         CEFOXITIN                     S         CEFTRIAXONE                   S         CIPROFLOXACIN                 R         ERTAPENEM                     S         GENTAMICIN                    S         IMIPENEM                      S         LEVOFLOXACIN                  R         NITROFURANTOIN                S         TRIMETHOPRIM/SULFAMETHOXAZOLE R             Coagulation Studies: No results for input(s): LABPROT, INR in the last 72 hours.  Urinalysis:  Recent Labs Lab 12/14/14 1048  COLORURINE YELLOW*  LABSPEC 1.004*  PHURINE 6.0  GLUCOSEU NEGATIVE  HGBUR 1+*  BILIRUBINUR NEGATIVE  KETONESUR NEGATIVE  PROTEINUR NEGATIVE  NITRITE NEGATIVE  LEUKOCYTESUR NEGATIVE    Lipid Panel:  No results found for: CHOL, TRIG, HDL, CHOLHDL, VLDL, LDLCALC  HgbA1C: No results found for: HGBA1C  Urine Drug Screen:  No results found for: LABOPIA, COCAINSCRNUR, LABBENZ, AMPHETMU, THCU, LABBARB  Alcohol Level: No results for input(s): ETH in the  last 168 hours.  Other results: EKG: normal EKG, normal sinus rhythm, unchanged from previous tracings.  Imaging: Ct Abdomen Pelvis Wo Contrast  12/14/2014   CLINICAL DATA:  Altered mental status.  History of cerebral palsy.  EXAM: CT ABDOMEN AND PELVIS WITHOUT CONTRAST  TECHNIQUE: Multidetector CT imaging of the abdomen and pelvis was performed following the standard protocol without IV contrast.  COMPARISON:  03/20/2009 and plain films of 02/13/2014.  FINDINGS: Lower chest: Left base volume loss. Mild left worse than right gynecomastia. Mild cardiomegaly. A moderate hiatal hernia.  Hepatobiliary: Multifactorial degradation, including patient arm position and overlying EKG lead artifact. Normal liver. Cholecystectomy, without biliary ductal dilatation.  Pancreas: Normal, without mass or ductal dilatation.  Spleen: Normal  Adrenals/Urinary Tract: Normal adrenal glands. Right extrarenal pelvis. An upper pole left renal lesion is most apparent on coronal image 82. Measures 7 mm and approximately 56 HU. Not readily apparent on the prior. Multiple left renal collecting system calculi, on the order of 8 mm or less. Bilateral renal cortical thinning is mild. Mild to moderate left-sided hydronephrosis secondary to a 4 mm stone at the left ureteropelvic junction. No distal ureteric stones. No bladder calculi.  Stomach/Bowel: Normal distal stomach. Colonic stool burden suggests constipation. Surgical site sugars in the region of the  ileocecal junction.  Vascular/Lymphatic: Normal caliber of the aorta and branch vessels. No abdominopelvic adenopathy.  Reproductive: Prostatic calcifications.  Other: No significant free fluid.  Musculoskeletal: Osteopenia. Moderate convex left lumbar spine curvature.  IMPRESSION: 1. Mild to moderate left-sided hydroureteronephrosis secondary to a 4 mm left ureteropelvic junction stone. 2. Otherwise, multifactorial degradation as detailed above. 3. Left nephrolithiasis with bilateral  renal cortical thinning. 4. 7 mm upper pole left renal lesion which is indeterminate. Given mild hyperattenuation, a hemorrhagic cyst is favored. Although the most definitive characterization would be with pre and post contrast abdominal MRI, given patient's clinical status, ultrasound followup at 1 year is a suggested strategy. 5. Moderate hiatal hernia. 6. Bilateral gynecomastia. 7.  Possible constipation.   Electronically Signed   By: Abigail Miyamoto M.D.   On: 12/14/2014 10:46   Ct Head Wo Contrast  12/14/2014   CLINICAL DATA:  Altered mental status, and decreased alertness in activity, not eating, history cerebral palsy, deafness, blindness  EXAM: CT HEAD WITHOUT CONTRAST  TECHNIQUE: Contiguous axial images were obtained from the base of the skull through the vertex without intravenous contrast.  COMPARISON:  12/08/2009  FINDINGS: Generalized atrophy.  Ex vacuo dilatation of the ventricular system, stable, consistent with degree of atrophy.  No midline shift or mass effect.  Stable appearance of brain parenchyma.  No evidence of mass lesion, intracranial hemorrhage or acute infarction.  Scattered dural calcifications in falx.  Partial opacification of RIGHT ethmoid air cells.  No acute osseous or sinus abnormalities otherwise seen.  IMPRESSION: Chronic atrophy and ventriculomegaly, stable.  No acute abnormalities.   Electronically Signed   By: Lavonia Dana M.D.   On: 12/14/2014 10:29   [place] Intraoperative  12/15/2014   CLINICAL DATA:    Ultrasound was provided for use by the ordering physician, and a technical  charge was applied by the performing facility.  No radiologist  interpretation/professional services rendered.    Dg Chest Port 1 View  12/14/2014   CLINICAL DATA:  Altered mental status/confusion  EXAM: PORTABLE CHEST - 1 VIEW  COMPARISON:  December 08, 2009  FINDINGS: There is focal airspace consolidation in the left base. Lungs elsewhere clear. Heart size and pulmonary vascularity are normal. No  adenopathy. There is upper thoracic levoscoliosis.  IMPRESSION: Left base airspace consolidation consistent with pneumonia. Lungs otherwise clear. Followup PA and lateral chest radiographs recommended in 3-4 weeks following trial of antibiotic therapy to ensure resolution and exclude underlying malignancy.   Electronically Signed   By: Lowella Grip III M.D.   On: 12/14/2014 10:55   Ir Nephrostomy Placement Left  12/15/2014   CLINICAL DATA:  Obstructing left UPJ calculus, hydronephrosis  EXAM: LEFT PERCUTANEOUS NEPHROSTOMY CATHETER PLACEMENT UNDER ULTRASOUND AND FLUOROSCOPIC GUIDANCE  FLUOROSCOPY TIME:  1.2 Minutes  TECHNIQUE: The procedure, risks (including but not limited to bleeding, infection, organ damage ), benefits, and alternatives were explained to the family. Questions regarding the procedure were encouraged and answered. The family understands and consents to the procedure.  Patient placed prone. LeftFlank region prepped with Betadine, draped in usual sterile fashion, infiltrated locally with 1% lidocaine.  Intravenous Fentanyl and Versed were administered as conscious sedation during continuous cardiorespiratory monitoring by the radiology RN, with a total moderate sedation time of less than 30 minutes.  Under real-time ultrasound guidance, a 21-gauge micropuncture needle was advanced into a posterior lower pole calyx. Ultrasound image documentation was saved. Urine spontaneously returned through the needle. Needle was exchanged over a guidewire for transitional dilator.  Contrast injection confirmed appropriate positioning. 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 Catheter was exchanged over a guidewire for a 10 French Dawson Mueller pigtail catheter, formed centrally within the left renal collecting system. Contrast injection confirms appropriate positioning and patency. Catheter secured externally with 0 Prolene suture and placed to external drain bag.  COMPLICATIONS: COMPLICATIONS none   IMPRESSION: 1. Technically successful left percutaneous nephrostomy catheter placement.   Electronically Signed   By: Lucrezia Europe M.D.   On: 12/15/2014 15:10     Assessment/Plan:  59 y.o. male . with a known history of Nephrolithiasis, Cerebral palsy, Congenital deafness, BPH, mood disorder, adenocarcinoma of the cecum, anemia and onychomycosis. The patient is nonverbal. The patient was sent from home. According to patient's sister and the health care giver, the patient was fine until this morning.  Possibility of L renal sone and L PNA which is being treated.  Mental status improved as per family.    Likely metabolic in nature secondary to ? PNA and renal stone. Pt not back to baseline but close to it as per family. Pt is more alert. Would keep in hospital this weekend and observe. Would not perform any further imaging from neuro stand point at this time.  12/15/2014, 3:58 PM

## 2014-12-15 NOTE — Procedures (Signed)
Left perc nephrostomy tube placed No complication No blood loss. See complete dictation in [facility].

## 2014-12-15 NOTE — Progress Notes (Signed)
Per Dr. Caryl Comes pt can be put on bland diet.

## 2014-12-15 NOTE — Progress Notes (Signed)
Used a more appropriate sized BP cuff - see next result for more accurate reading

## 2014-12-15 NOTE — Care Management (Signed)
Patient presents from [place].  He presented with altered mental status.  He is not verbal at baseline.  He was thought to be more lethargic than usual.  Discussed need for CSW referral during progression if not already present

## 2014-12-16 LAB — CBC
HCT: 38 % — ABNORMAL LOW (ref 40.0–52.0)
Hemoglobin: 12.6 g/dL — ABNORMAL LOW (ref 13.0–18.0)
MCH: 31.9 pg (ref 26.0–34.0)
MCHC: 33.3 g/dL (ref 32.0–36.0)
MCV: 95.8 fL (ref 80.0–100.0)
PLATELETS: 166 10*3/uL (ref 150–440)
RBC: 3.97 MIL/uL — AB (ref 4.40–5.90)
RDW: 12.6 % (ref 11.5–14.5)
WBC: 7.1 10*3/uL (ref 3.8–10.6)

## 2014-12-16 LAB — COMPREHENSIVE METABOLIC PANEL
ALBUMIN: 2.9 g/dL — AB (ref 3.5–5.0)
ALK PHOS: 62 U/L (ref 38–126)
ALT: 13 U/L — ABNORMAL LOW (ref 17–63)
ANION GAP: 3 — AB (ref 5–15)
AST: 12 U/L — ABNORMAL LOW (ref 15–41)
BILIRUBIN TOTAL: 0.4 mg/dL (ref 0.3–1.2)
BUN: 14 mg/dL (ref 6–20)
CO2: 28 mmol/L (ref 22–32)
Calcium: 8.3 mg/dL — ABNORMAL LOW (ref 8.9–10.3)
Chloride: 109 mmol/L (ref 101–111)
Creatinine, Ser: 0.99 mg/dL (ref 0.61–1.24)
GFR calc Af Amer: >60 mL/min (ref >60)
GLUCOSE: 100 mg/dL — AB (ref 65–99)
POTASSIUM: 4.1 mmol/L (ref 3.5–5.1)
Sodium: 140 mmol/L (ref 135–145)
Total Protein: 5.9 g/dL — ABNORMAL LOW (ref 6.5–8.1)

## 2014-12-16 LAB — TROPONIN I: Troponin I: <0.03 ng/mL (ref <0.031)

## 2014-12-16 NOTE — Progress Notes (Signed)
Patient ID: Shawn Sweeney, male   DOB: 09-16-1955, 59 y.o.   MRN: [id number] SUBJECTIVE:  Pt with h/o cerebral palsy with spasticity, congenital deafness, blindness, with prior h/o colon cancer, presented with unresponsiveness.  Pt not verbally communicative at baseline, though is able to indicate pain/discomfort, and family had not noted any indication of this recently.  Noted to have bradycardia/hypotension.  Imaging reveals possible left pneumonia as well as left nephrolithiasis with hydronephrosis.  No fever, chills; no N/V/D noted.  Remains hypotensive. Family reports pt has been eating well. Family members at bedside.   ______________________________________________________________________  ROS: Please see HPI; pt unable to provide ROS  Past Medical History  Diagnosis Date  . Hypertension   . Nephrolithiasis   . Cerebral palsy   . Congenital deafness   . BPH (benign prostatic hyperplasia)   . Adenocarcinoma of cecum   . Anemia   . Blind   . Mood disorder   . Onychomycosis     Past Surgical History  Procedure Laterality Date  . Colon surgery    . Diagnostic laparoscopy    . Cholecystectomy    . Colectomy       Current facility-administered medications:  .  0.9 %  sodium chloride infusion, 250 mL, Intravenous, PRN, Demetrios Loll, MD .  0.9 %  sodium chloride infusion, , Intravenous, Continuous, Tama High III, MD, Last Rate: 100 mL/hr at 12/16/14 0842 .  acetaminophen (TYLENOL) tablet 650 mg, 650 mg, Oral, Q6H PRN **OR** acetaminophen (TYLENOL) suppository 650 mg, 650 mg, Rectal, Q6H PRN, Demetrios Loll, MD .  albuterol (PROVENTIL) (2.5 MG/3ML) 0.083% nebulizer solution 2.5 mg, 2.5 mg, Nebulization, Q2H PRN, Demetrios Loll, MD .  ARIPiprazole (ABILIFY) tablet 1 mg, 1 mg, Oral, Daily, Demetrios Loll, MD, 1 mg at 12/16/14 0914 .  azithromycin (ZITHROMAX) 250 mg in dextrose 5 % 125 mL IVPB, 250 mg, Intravenous, Q24H, Tama High III, MD, 250 mg at 12/16/14 [phone] .  cefTRIAXone (ROCEPHIN) 1 g  in dextrose 5 % 50 mL IVPB - Premix, 1 g, Intravenous, Q24H, Tama High III, MD, 1 g at 12/16/14 1028 .  clonazePAM (KLONOPIN) tablet 0.25 mg, 0.25 mg, Oral, TID, Demetrios Loll, MD, 0.25 mg at 12/15/14 2259 .  dutasteride (AVODART) capsule 0.5 mg, 0.5 mg, Oral, Daily, Demetrios Loll, MD, 0.5 mg at 12/16/14 0915 .  ferrous sulfate tablet 325 mg, 325 mg, Oral, Daily, Demetrios Loll, MD, 325 mg at 12/16/14 0915 .  FLUoxetine (PROZAC) capsule 20 mg, 20 mg, Oral, Daily, Demetrios Loll, MD, 20 mg at 12/16/14 0915 .  fluticasone (FLONASE) 50 MCG/ACT nasal spray 2 spray, 2 spray, Each Nare, QHS, Demetrios Loll, MD, 2 spray at 12/15/14 2300 .  lidocaine (PF) (XYLOCAINE) 1 % injection, , , PRN, Arne Cleveland, MD, 10 mL at 12/15/14 1336 .  loratadine (CLARITIN) tablet 10 mg, 10 mg, Oral, Daily, Demetrios Loll, MD, 10 mg at 12/16/14 0914 .  ondansetron (ZOFRAN) tablet 4 mg, 4 mg, Oral, Q6H PRN **OR** ondansetron (ZOFRAN) injection 4 mg, 4 mg, Intravenous, Q6H PRN, Demetrios Loll, MD .  pantoprazole (PROTONIX) EC tablet 40 mg, 40 mg, Oral, Daily, Demetrios Loll, MD, 40 mg at 12/16/14 0915 .  sodium chloride 0.9 % injection 3 mL, 3 mL, Intravenous, Q12H, Demetrios Loll, MD, 3 mL at 12/15/14 2300 .  sodium chloride 0.9 % injection 3 mL, 3 mL, Intravenous, PRN, Demetrios Loll, MD .  tamsulosin [facility]) capsule 0.4 mg, 0.4 mg, Oral, Daily, Demetrios Loll, MD, 0.4  mg at 12/16/14 0915  PHYSICAL EXAM:  BP 98/53 mmHg  Pulse 64  Temp(Src) 98.6 F (37 C) (Oral)  Resp 16  Ht 5' (1.524 m)  Wt 41.277 kg (91 lb)  BMI 17.77 kg/m2  SpO2 99%  General: Thin  male, in fetal position, NAD HEENT: pupils minimally reactive; bilateral blindness; OP difficult to examine Neck: supple, trachea midline, no thyromegaly Chest: normal to palpation Lungs: decreased airflow without retractions or wheezes Cardiovascular: RRR,  no gallop; distal pulses 1+ Abdomen: soft, nontender, nondistended, positive bowel sounds Extremities: no clubbing, cyanosis, edema Neuro: diffuse  spasticity Derm: no significant rashes or nodules; decreased skin turgor Lymph: no cervical or supraclavicular lymphadenopathy  Labs and imaging studies were reviewed  ASSESSMENT/PLAN:  1.  AMS- likely metabolic, secondary to left nephrolithiasis and left hydroureteronephrosis, neurology input appreciated. Continue Rocephin and Azithromycin for possible pneumonia, though no fever, no leukocytosis.  Some cough.  Follow telemetry and add IVF, given hypotension.  Follow Echo and labs.  2. CT abdomen revealed Left nephrolithiasis, 4 mm stone at ureteropelvic junction and left hydroureteronephrosis. Urology input appreciated. Patient underwent left percutaneous nephrostomy catheter placement yesterday. He is doing well. No other overt signs of discomfort from this, and pt is able to show when he is in pain.  3. Mood disorder.  Cont home medications.  ;

## 2014-12-17 MED ORDER — AZITHROMYCIN 250 MG PO TABS
250.0000 mg | ORAL_TABLET | Freq: Every day | ORAL | Status: DC
Start: 2014-12-17 — End: 2014-12-18
  Administered 2014-12-17: 250 mg via ORAL
  Filled 2014-12-17: qty 1

## 2014-12-17 NOTE — Progress Notes (Signed)
Patient ID: Shawn Sweeney, male   DOB: 01-02-1956, 58 y.o.   MRN: [id number] SUBJECTIVE:  Pt with h/o cerebral palsy with spasticity, congenital deafness, blindness, with prior h/o colon cancer, presented with unresponsiveness.  Pt not verbally communicative at baseline, though is able to indicate pain/discomfort, and family had not noted any indication of this recently.  Noted to have bradycardia/hypotension.  Imaging reveals possible left pneumonia as well as left nephrolithiasis with hydronephrosis.  No fever, chills; no N/V/D noted. Family reports pt has been eating well. Hypotension improved.  ______________________________________________________________________  ROS: Please see HPI; pt unable to provide ROS  Past Medical History  Diagnosis Date  . Hypertension   . Nephrolithiasis   . Cerebral palsy   . Congenital deafness   . BPH (benign prostatic hyperplasia)   . Adenocarcinoma of cecum   . Anemia   . Blind   . Mood disorder   . Onychomycosis     Past Surgical History  Procedure Laterality Date  . Colon surgery    . Diagnostic laparoscopy    . Cholecystectomy    . Colectomy       Current facility-administered medications:  .  0.9 %  sodium chloride infusion, 250 mL, Intravenous, PRN, Demetrios Loll, MD .  0.9 %  sodium chloride infusion, , Intravenous, Continuous, Tama High III, MD, Last Rate: 100 mL/hr at 12/17/14 0618 .  acetaminophen (TYLENOL) tablet 650 mg, 650 mg, Oral, Q6H PRN **OR** acetaminophen (TYLENOL) suppository 650 mg, 650 mg, Rectal, Q6H PRN, Demetrios Loll, MD .  albuterol (PROVENTIL) (2.5 MG/3ML) 0.083% nebulizer solution 2.5 mg, 2.5 mg, Nebulization, Q2H PRN, Demetrios Loll, MD .  ARIPiprazole (ABILIFY) tablet 1 mg, 1 mg, Oral, Daily, Demetrios Loll, MD, 1 mg at 12/17/14 0933 .  azithromycin (ZITHROMAX) tablet 250 mg, 250 mg, Oral, Daily, Tama High III, MD, 250 mg at 12/17/14 0932 .  cefTRIAXone (ROCEPHIN) 1 g in dextrose 5 % 50 mL IVPB - Premix, 1 g, Intravenous,  Q24H, Tama High III, MD, 1 g at 12/17/14 0932 .  clonazePAM (KLONOPIN) tablet 0.25 mg, 0.25 mg, Oral, TID, Demetrios Loll, MD, 0.25 mg at 12/17/14 0933 .  dutasteride (AVODART) capsule 0.5 mg, 0.5 mg, Oral, Daily, Demetrios Loll, MD, 0.5 mg at 12/17/14 0932 .  ferrous sulfate tablet 325 mg, 325 mg, Oral, Daily, Demetrios Loll, MD, 325 mg at 12/17/14 0932 .  FLUoxetine (PROZAC) capsule 20 mg, 20 mg, Oral, Daily, Demetrios Loll, MD, 20 mg at 12/17/14 0933 .  fluticasone (FLONASE) 50 MCG/ACT nasal spray 2 spray, 2 spray, Each Nare, QHS, Demetrios Loll, MD, 2 spray at 12/17/14 0024 .  lidocaine (PF) (XYLOCAINE) 1 % injection, , , PRN, Arne Cleveland, MD, 10 mL at 12/15/14 1336 .  loratadine (CLARITIN) tablet 10 mg, 10 mg, Oral, Daily, Demetrios Loll, MD, 10 mg at 12/17/14 0932 .  ondansetron (ZOFRAN) tablet 4 mg, 4 mg, Oral, Q6H PRN **OR** ondansetron (ZOFRAN) injection 4 mg, 4 mg, Intravenous, Q6H PRN, Demetrios Loll, MD .  pantoprazole (PROTONIX) EC tablet 40 mg, 40 mg, Oral, Daily, Demetrios Loll, MD, 40 mg at 12/17/14 0933 .  sodium chloride 0.9 % injection 3 mL, 3 mL, Intravenous, Q12H, Demetrios Loll, MD, 3 mL at 12/15/14 2300 .  sodium chloride 0.9 % injection 3 mL, 3 mL, Intravenous, PRN, Demetrios Loll, MD .  tamsulosin [facility]) capsule 0.4 mg, 0.4 mg, Oral, Daily, Demetrios Loll, MD, 0.4 mg at 12/17/14 0933  PHYSICAL EXAM:  BP 103/52 mmHg  Pulse 82  Temp(Src) 98.7 F (37.1 C) (Oral)  Resp 18  Ht 5' (1.524 m)  Wt 41.277 kg (91 lb)  BMI 17.77 kg/m2  SpO2 96%  General: Thin  male, in fetal position, NAD HEENT: pupils minimally reactive; bilateral blindness; OP difficult to examine Neck: supple, trachea midline, no thyromegaly Chest: normal to palpation Lungs: decreased airflow without retractions or wheezes Cardiovascular: RRR,  no gallop; distal pulses 1+ Abdomen: soft, nontender, nondistended, positive bowel sounds Extremities: no clubbing, cyanosis, edema Neuro: diffuse spasticity Derm: no significant rashes or nodules;  decreased skin turgor Lymph: no cervical or supraclavicular lymphadenopathy  Labs and imaging studies were reviewed  ASSESSMENT/PLAN:  1.  AMS- likely metabolic, secondary to left nephrolithiasis and left hydroureteronephrosis, neurology input appreciated. Continue Rocephin and Azithromycin for possible pneumonia, though no fever, no leukocytosis.  Some cough.  Follow telemetry and continue IVF for hypotension.  Follow labs.  2. CT abdomen revealed Left nephrolithiasis, 4 mm stone at ureteropelvic junction and left hydroureteronephrosis. Urology input appreciated. Patient underwent left percutaneous nephrostomy catheter placement on 12/15/14. He is doing well. No other overt signs of discomfort from this, and pt is able to show when he is in pain.  3. Mood disorder.  Cont home medications.  ;

## 2014-12-17 NOTE — Consult Note (Signed)
CC: AMS  HPI: Shawn Sweeney is an 59 y.o. male . with a known history of Nephrolithiasis, Cerebral palsy, Congenital deafness, BPH, mood disorder, adenocarcinoma of the cecum, anemia and onychomycosis. The patient is nonverbal. The patient was sent from home. According to patient's sister and the health care giver, the patient was fine until this morning.  Possibility of L renal sone and L PNA which is being treated.   Pt is close to baeline  Past Medical History  Diagnosis Date  . Hypertension   . Nephrolithiasis   . Cerebral palsy   . Congenital deafness   . BPH (benign prostatic hyperplasia)   . Adenocarcinoma of cecum   . Anemia   . Blind   . Mood disorder   . Onychomycosis     Past Surgical History  Procedure Laterality Date  . Colon surgery    . Diagnostic laparoscopy    . Cholecystectomy    . Colectomy      Family History  Problem Relation Age of Onset  . CAD Father   . Prostate cancer Father   . Colon cancer Father     Social History:  reports that he has never smoked. He does not have any smokeless tobacco history on file. He reports that he does not drink alcohol or use illicit drugs.  Allergies  Allergen Reactions  . Risperidone Hives and Other (See Comments)  . Levofloxacin Other (See Comments) and Rash    Medications: I have reviewed the patient's current medications.  ROS: Unable to obtain as pt is non verbal at [facility]  Physical Examination: Blood pressure 103/52, pulse 82, temperature 98.7 F (37.1 C), temperature source Oral, resp. rate 18, height 5' (1.524 m), weight 41.277 kg (91 lb), SpO2 96 %.  Pt is non verbal, blind, does not follow commands.  Appears to be moving b/l UE symmetrically, weak in b/l LE since he is wheelchair bound   Laboratory Studies:   Basic Metabolic Panel:  Recent Labs Lab 12/14/14 0942 12/16/14 0432  NA 139 140  K 3.9 4.1  CL 105 109  CO2 26 28  GLUCOSE 66 100*  BUN 12 14  CREATININE 0.93 0.99   CALCIUM 9.1 8.3*    Liver Function Tests:  Recent Labs Lab 12/14/14 0942 12/16/14 0432  AST 19 12*  ALT 18 13*  ALKPHOS 75 62  BILITOT 0.4 0.4  PROT 7.3 5.9*  ALBUMIN 3.7 2.9*    Recent Labs Lab 12/14/14 0942  LIPASE 46    Recent Labs Lab 12/14/14 0942  AMMONIA 29    CBC:  Recent Labs Lab 12/14/14 0942 12/16/14 0432  WBC 5.4 7.1  NEUTROABS 3.6  --   HGB 14.2 12.6*  HCT 43.2 38.0*  MCV 96.9 95.8  PLT 152 166    Cardiac Enzymes:  Recent Labs Lab 12/16/14 0758  TROPONINI <0.03    BNP: Invalid input(s): POCBNP  CBG:  Recent Labs Lab 12/14/14 0943 12/14/14 1215  GLUCAP 82 18    Microbiology: Results for orders placed or performed in visit on 10/26/13  Urine culture     Status: None   Collection Time: 10/27/13  7:30 AM  Result Value Ref Range Status   Micro Text Report   Final       SOURCE: CLEAN CATCH    ORGANISM 1                30,000 CFU/ML ESCHERICHIA COLI   ANTIBIOTIC  ORG#1     AMPICILLIN                    R         CEFAZOLIN                     S         CEFOXITIN                     S         CEFTRIAXONE                   S         CIPROFLOXACIN                 R         ERTAPENEM                     S         GENTAMICIN                    S         IMIPENEM                      S         LEVOFLOXACIN                  R         NITROFURANTOIN                S         TRIMETHOPRIM/SULFAMETHOXAZOLE R             Coagulation Studies: No results for input(s): LABPROT, INR in the last 72 hours.  Urinalysis:   Recent Labs Lab 12/14/14 1048  COLORURINE YELLOW*  LABSPEC 1.004*  PHURINE 6.0  GLUCOSEU NEGATIVE  HGBUR 1+*  BILIRUBINUR NEGATIVE  KETONESUR NEGATIVE  PROTEINUR NEGATIVE  NITRITE NEGATIVE  LEUKOCYTESUR NEGATIVE    Lipid Panel:  No results found for: CHOL, TRIG, HDL, CHOLHDL, VLDL, LDLCALC  HgbA1C: No results found for: HGBA1C  Urine Drug Screen:  No results found for: LABOPIA,  COCAINSCRNUR, LABBENZ, AMPHETMU, THCU, LABBARB  Alcohol Level: No results for input(s): ETH in the last 168 hours.  Other results: EKG: normal EKG, normal sinus rhythm, unchanged from previous tracings.  Imaging: No results found.   Assessment/Plan:  59 y.o. male . with a known history of Nephrolithiasis, Cerebral palsy, Congenital deafness, BPH, mood disorder, adenocarcinoma of the cecum, anemia and onychomycosis. The patient is nonverbal. The patient was sent from home. According to patient's sister and the health care giver, the patient was fine until this morning.  Possibility of L renal sone and L PNA which is being treated.   Mental status improved and close to baseline. Did tolerated diet. S/p discussion with family No further testing from neuro stand point.   Possible d/c when appropriate from medical team.   Leotis Pain   12/17/2014, 2:41 PM

## 2014-12-17 NOTE — Care Management Note (Signed)
Case Management Note  Patient Details  Name: Shawn Sweeney MRN: [id number] Date of Birth: July 21, 1955  Subjective/Objective:       59 yo Mr [name] was admitted 12/14/14 from the [facility] facility with a left kidney stone and possible PNA. On 12/15/14 he received a percutaneous nephrostomy cath placement. [name] is bedridden per cerebral palsy. Has a wheel chair at the [facility] facility. His personal aid is with him in his hospital room and provided most of this information because [name] is deaf and blind. PCP= Dr Ramonita Lab. Pharmacy= [facility]. Mr [name] was apparently unresponsive upon arrival to [facility] but today has is eyes open and is moving his head and arms. A Social Work consult was placed for discharge back to [facility]. .             Action/Plan:   Expected Discharge Date:                  Expected Discharge Plan:     In-House Referral:     Discharge planning Services     Post Acute Care Choice:    Choice offered to:     DME Arranged:    DME Agency:     HH Arranged:    [place] Agency:     Status of Service:     Medicare Important Message Given:    Date Medicare IM Given:    Medicare IM give by:    Date Additional Medicare IM Given:    Additional Medicare Important Message give by:     If discussed at [place] of Stay Meetings, dates discussed:    Additional Comments:  Rocio Wolak A, RN 12/17/2014, 12:30 PM

## 2014-12-18 ENCOUNTER — Encounter: Payer: Self-pay | Admitting: Internal Medicine

## 2014-12-18 DIAGNOSIS — J189 Pneumonia, unspecified organism: Secondary | ICD-10-CM | POA: Diagnosis present

## 2014-12-18 HISTORY — DX: Pneumonia, unspecified organism: J18.9

## 2014-12-18 LAB — CBC WITH DIFFERENTIAL/PLATELET
BASOS ABS: 0 10*3/uL (ref 0–0.1)
Basophils Relative: 0 %
EOS ABS: 0.3 10*3/uL (ref 0–0.7)
Eosinophils Relative: 4 %
HEMATOCRIT: 36 % — AB (ref 40.0–52.0)
HEMOGLOBIN: 12.2 g/dL — AB (ref 13.0–18.0)
LYMPHS ABS: 1.2 10*3/uL (ref 1.0–3.6)
Lymphocytes Relative: 15 %
MCH: 32.2 pg (ref 26.0–34.0)
MCHC: 33.8 g/dL (ref 32.0–36.0)
MCV: 95.2 fL (ref 80.0–100.0)
Monocytes Absolute: 0.6 10*3/uL (ref 0.2–1.0)
Monocytes Relative: 8 %
Neutro Abs: 5.6 10*3/uL (ref 1.4–6.5)
Neutrophils Relative %: 73 %
PLATELETS: 160 10*3/uL (ref 150–440)
RBC: 3.79 MIL/uL — ABNORMAL LOW (ref 4.40–5.90)
RDW: 12.6 % (ref 11.5–14.5)
WBC: 7.8 10*3/uL (ref 3.8–10.6)

## 2014-12-18 LAB — BLOOD GAS, VENOUS
Acid-Base Excess: 8.4 mmol/L — ABNORMAL HIGH (ref 0.0–3.0)
Bicarbonate: 17.9 mEq/L — ABNORMAL LOW (ref 21.0–28.0)
FIO2: 1 %
LHR: 12 {breaths}/min
MECHVT: 500 mL
Mechanical Rate: 12
O2 Content: 334 L/min
PATIENT TEMPERATURE: 37
PCO2 VEN: 39 mmHg — AB (ref 44.0–60.0)
PEEP/CPAP: 5 cmH2O
pH, Ven: 7.27 — ABNORMAL LOW (ref 7.320–7.430)
pO2, Ven: 334 mmHg — ABNORMAL HIGH (ref 30.0–45.0)

## 2014-12-18 LAB — COMPREHENSIVE METABOLIC PANEL
ALT: 20 U/L (ref 17–63)
ANION GAP: 2 — AB (ref 5–15)
AST: 18 U/L (ref 15–41)
Albumin: 2.8 g/dL — ABNORMAL LOW (ref 3.5–5.0)
Alkaline Phosphatase: 69 U/L (ref 38–126)
BILIRUBIN TOTAL: 0.4 mg/dL (ref 0.3–1.2)
BUN: 12 mg/dL (ref 6–20)
CALCIUM: 8.1 mg/dL — AB (ref 8.9–10.3)
CO2: 28 mmol/L (ref 22–32)
Chloride: 109 mmol/L (ref 101–111)
Creatinine, Ser: 0.86 mg/dL (ref 0.61–1.24)
Glucose, Bld: 97 mg/dL (ref 65–99)
Potassium: 3 mmol/L — ABNORMAL LOW (ref 3.5–5.1)
SODIUM: 139 mmol/L (ref 135–145)
Total Protein: 5.7 g/dL — ABNORMAL LOW (ref 6.5–8.1)

## 2014-12-18 LAB — MAGNESIUM: Magnesium: 1.6 mg/dL — ABNORMAL LOW (ref 1.7–2.4)

## 2014-12-18 MED ORDER — POTASSIUM CHLORIDE CRYS ER 20 MEQ PO TBCR
20.0000 meq | EXTENDED_RELEASE_TABLET | Freq: Two times a day (BID) | ORAL | Status: DC
  Administered 2014-12-18: 20 meq via ORAL
  Filled 2014-12-18: qty 1

## 2014-12-18 MED ORDER — CEFUROXIME AXETIL 250 MG/5ML PO SUSR
250.0000 mg | Freq: Two times a day (BID) | ORAL | Status: DC
  Filled 2014-12-18 (×2): qty 5

## 2014-12-18 MED ORDER — CEFUROXIME AXETIL 250 MG PO TABS: 250.0000 mg | ORAL_TABLET | Freq: Two times a day (BID) | ORAL | Status: DC

## 2014-12-18 MED ORDER — CEFUROXIME AXETIL 250 MG PO TABS
250.0000 mg | ORAL_TABLET | Freq: Two times a day (BID) | ORAL | Status: DC
  Administered 2014-12-18: 250 mg via ORAL
  Filled 2014-12-18 (×3): qty 1

## 2014-12-18 NOTE — Progress Notes (Signed)
Patient ID: Shawn Sweeney, male   DOB: 06/16/1956, 59 y.o.   MRN: [id number] SUBJECTIVE:  Pt with h/o cerebral palsy with spasticity, congenital deafness, blindness, with prior h/o colon cancer, presented with unresponsiveness.  Pt not verbally communicative at baseline, though is able to indicate pain/discomfort.  S/p percutaneous nephrostomy tube for left renal stone/hydronephrosis.  CXR with possible pneumonia, and has been on abx over weekend with no fever, chills, cough, or elevation of WBC.  Won't leave IV in place.  No N/V. No family available currently.  ______________________________________________________________________  ROS: Please see HPI; pt unable to provide ROS  Past Medical History  Diagnosis Date  . Hypertension   . Nephrolithiasis   . Cerebral palsy   . Congenital deafness   . BPH (benign prostatic hyperplasia)   . Adenocarcinoma of cecum   . Anemia   . Blind   . Mood disorder   . Onychomycosis   . Pneumonia 12/18/2014    Past Surgical History  Procedure Laterality Date  . Colon surgery    . Diagnostic laparoscopy    . Cholecystectomy    . Colectomy       Current facility-administered medications:  .  acetaminophen (TYLENOL) tablet 650 mg, 650 mg, Oral, Q6H PRN **OR** acetaminophen (TYLENOL) suppository 650 mg, 650 mg, Rectal, Q6H PRN, Demetrios Loll, MD .  albuterol (PROVENTIL) (2.5 MG/3ML) 0.083% nebulizer solution 2.5 mg, 2.5 mg, Nebulization, Q2H PRN, Demetrios Loll, MD .  ARIPiprazole (ABILIFY) tablet 1 mg, 1 mg, Oral, Daily, Demetrios Loll, MD, 1 mg at 12/17/14 0933 .  azithromycin (ZITHROMAX) tablet 250 mg, 250 mg, Oral, Daily, Tama High III, MD, 250 mg at 12/17/14 0932 .  cefUROXime (CEFTIN) 250 MG/5ML suspension 250 mg, 250 mg, Oral, Q12H, Tama High III, MD .  clonazePAM [facility]) tablet 0.25 mg, 0.25 mg, Oral, TID, Demetrios Loll, MD, 0.25 mg at 12/17/14 2106 .  dutasteride (AVODART) capsule 0.5 mg, 0.5 mg, Oral, Daily, Demetrios Loll, MD, 0.5 mg at 12/17/14 0932 .   ferrous sulfate tablet 325 mg, 325 mg, Oral, Daily, Demetrios Loll, MD, 325 mg at 12/17/14 0932 .  FLUoxetine (PROZAC) capsule 20 mg, 20 mg, Oral, Daily, Demetrios Loll, MD, 20 mg at 12/17/14 0933 .  fluticasone (FLONASE) 50 MCG/ACT nasal spray 2 spray, 2 spray, Each Nare, QHS, Demetrios Loll, MD, 2 spray at 12/17/14 2106 .  loratadine (CLARITIN) tablet 10 mg, 10 mg, Oral, Daily, Demetrios Loll, MD, 10 mg at 12/17/14 0932 .  ondansetron (ZOFRAN) tablet 4 mg, 4 mg, Oral, Q6H PRN **OR** ondansetron (ZOFRAN) injection 4 mg, 4 mg, Intravenous, Q6H PRN, Demetrios Loll, MD .  pantoprazole (PROTONIX) EC tablet 40 mg, 40 mg, Oral, Daily, Demetrios Loll, MD, 40 mg at 12/17/14 0933 .  potassium chloride SA (K-DUR,KLOR-CON) CR tablet 20 mEq, 20 mEq, Oral, BID, Tama High III, MD .  tamsulosin [facility]) capsule 0.4 mg, 0.4 mg, Oral, Daily, Demetrios Loll, MD, 0.4 mg at 12/17/14 0933  PHYSICAL EXAM:  BP 125/69 mmHg  Pulse 61  Temp(Src) 99 F (37.2 C) (Oral)  Resp 16  Ht 5' (1.524 m)  Wt 41.277 kg (91 lb)  BMI 17.77 kg/m2  SpO2 97%  General: Thin  male, in fetal position, in NAD HEENT: pupils minimally reactive; bilateral blindness; resists OP exam Neck: supple, trachea midline, no thyromegaly Chest: normal to palpation Lungs: decreased airflow without retractions or wheezes Cardiovascular: RRR,  no gallop; distal pulses 1+ Abdomen: soft, nontender, nondistended, positive bowel sounds Back: nephrostomy  tube Extremities: no clubbing, cyanosis, edema Neuro: diffuse spasticity Derm: no significant rashes or nodules; decreased skin turgor Lymph: no cervical or supraclavicular lymphadenopathy  Labs and imaging studies were reviewed  ASSESSMENT/PLAN:   1.  AMS- appears due to hypotension related to hydronephrosis and possible pneumonia.  Appears close to baseline.  Address with family when available.  Diagnosis of pneumonia is by CXR appearance only, with no other notable respiratory symptoms; will plan to finish course of abx.    2. Renal stone- appreciate urology and interventional radiology. Per note, plan is for pt to keep nephrostomy tube in place and f/u with urology as outpt to further address stones 3. Mood disorder.  Stable with home medications. 4. Hypokalemia- replacing as able; check Mg.  Unclear if he will take Po meds well, and will need to confirm this before pt able to go home 5. D/c plan- dependent on success with PO meds.  If doing well, may be able to go home this PM (though need family thoughts on if he seems back to baseline).  If not, may need to watch overnight and restart IV if he will allow.  ;

## 2014-12-18 NOTE — Clinical Social Work Note (Signed)
Patient transferred to [facility] over the weekend from [facility]. Patient will need to discharge with a nephrostomy tube. CSW has spoken to Butch Penny at [facility]: [phone] and she will need to evaluate patient and is needing exact orders regarding care of nephrostomy tube prior to discharge. Shela Leff MSW,LCSWA [phone]

## 2014-12-18 NOTE — Clinical Social Work Note (Signed)
Patient's mother, brother, and sister in law are all aware of patient's discharge today. Shela Leff MSW,LCSWA [phone]

## 2014-12-18 NOTE — Discharge Summary (Signed)
Physician Discharge Summary  Shawn Sweeney LZJ:[id number] DOB: June 30, 1956 DOA: 12/14/2014  PCP: Adin Hector, MD  Admit date: 12/14/2014 Discharge date: 12/18/2014   Recommendations for Outpatient Follow-up:  1. Follow up with [facility] (Dr. Bernardo Heater) this week 2. Follow up with Dr. Caryl Comes in 2 wks  Discharge Diagnoses:  Principal Problem:   Altered mental status Active Problems:   Hydronephrosis, left   Nephrolithiasis   Hypotension   Bradycardia   Cerebral palsy with spastic/ataxic diplegia   Deafness congenital   Blindness   Pneumonia hypokalemia  Discharge Condition: stable  Diet recommendation: regular  Filed Weights   12/14/14 0910 12/14/14 1400  Weight: 40.824 kg (90 lb) 41.277 kg (91 lb)    Hospital Course:  Pt admitted with decreased responsiveness and hypotension, with improvement with IVF support.  Films revealed left hydronephrosis due to renal stone.  Urology consulted; nephrostomy tube placed by interventional radiology, with good results.  Renal function stable; no fever, chills.  Recommendation for pt to follow up with urology as outpatient to definitively address stones.  Change dressing over tube daily; flush TID with 5 ml normal saline.  CXR suggested pneumonia present; pt without symptoms or other signs except altered mental status.  Placed on empiric antibiotics for pneumonia, and will plan to finish course for this.  Sats stable; no fever, chills; no cough noted  Pt's mental status improved to baseline, per family.  Appetite returned and pt tolerating diet. Bowels moving. Will plan to discharge to [name] home in stable condition; physical activity is up as tolerated with assistance. Nephrostomy tube care as above.  Follow up with Dr. Bernardo Heater (case discussed with him) this week, with plan to follow up in our office in 2 weeks.   Consultations:  Urology, neurology.  Discharge Exam: Filed Vitals:   12/18/14 0736  BP: 110/63  Pulse: 61  Temp:  98 F (36.7 C)  Resp: 16    Discharge Instructions   Discharge Instructions    Change dressing (specify)    Complete by:  As directed   Dressing change: 1 time per day until tube discontinued     Diet - low sodium heart healthy    Complete by:  As directed      Discharge instructions    Complete by:  As directed   Nephrostomy tube to gravity drainage.  Flush tube with 5 ml NS three times a day.     Increase activity slowly    Complete by:  As directed           Current Discharge Medication List    START taking these medications   Details  cefUROXime (CEFTIN) 250 MG tablet Take 1 tablet (250 mg total) by mouth 2 (two) times daily with a meal. Qty: 14 tablet, Refills: 0      CONTINUE these medications which have NOT CHANGED   Details  ARIPiprazole (ABILIFY) 2 MG tablet Take 1 mg by mouth daily.    clonazePAM (KLONOPIN) 0.5 MG tablet Take 0.25 mg by mouth 3 (three) times daily.    dutasteride (AVODART) 0.5 MG capsule Take 1 capsule by mouth daily.    Ferrous Sulfate (IRON) 142 (45 FE) MG TBCR Take 45 mg by mouth 2 (two) times a week.    fexofenadine (ALLEGRA) 180 MG tablet Take 180 mg by mouth daily as needed for allergies or rhinitis.    FLUoxetine (PROZAC) 20 MG capsule Take 20 mg by mouth daily.    fluticasone (FLONASE)  50 MCG/ACT nasal spray Place 2 sprays into both nostrils at bedtime.    furosemide (LASIX) 20 MG tablet Take 20 mg by mouth daily as needed.    potassium chloride (K-DUR) 10 MEQ tablet Take 10 mEq by mouth daily.    tamsulosin (FLOMAX) 0.4 MG CAPS capsule Take 1 capsule by mouth daily.    hydrocortisone (ANUSOL-HC) 25 MG suppository Place 25 mg rectally every 12 (twelve) hours as needed.    Multiple Vitamin (MULTI-VITAMINS) TABS Take 1 tablet by mouth daily.    omeprazole (PRILOSEC) 20 MG capsule Take 20 mg by mouth 2 (two) times daily.      STOP taking these medications     guaiFENesin (ROBITUSSIN) 100 MG/5ML liquid        Allergies   Allergen Reactions  . Risperidone Hives and Other (See Comments)  . Levofloxacin Other (See Comments) and Rash   Follow-up Information    Follow up with BERT Briscoe Burns III, MD In 2 weeks.   Specialty:  Internal Medicine   Contact information:   [place] [address] [phone]       Follow up with John Giovanni, MD In 3 days.   Specialty:  Urology   Why:  for left nephrostomy tube/renal stones   Contact information:   [facility] [place] [address] [phone]        The results of significant diagnostics from this hospitalization (including imaging, microbiology, ancillary and laboratory) are listed below for reference.    Significant Diagnostic Studies: Ct Abdomen Pelvis Wo Contrast  12/14/2014   CLINICAL DATA:  Altered mental status.  History of cerebral palsy.  EXAM: CT ABDOMEN AND PELVIS WITHOUT CONTRAST  TECHNIQUE: Multidetector CT imaging of the abdomen and pelvis was performed following the standard protocol without IV contrast.  COMPARISON:  03/20/2009 and plain films of 02/13/2014.  FINDINGS: Lower chest: Left base volume loss. Mild left worse than right gynecomastia. Mild cardiomegaly. A moderate hiatal hernia.  Hepatobiliary: Multifactorial degradation, including patient arm position and overlying EKG lead artifact. Normal liver. Cholecystectomy, without biliary ductal dilatation.  Pancreas: Normal, without mass or ductal dilatation.  Spleen: Normal  Adrenals/Urinary Tract: Normal adrenal glands. Right extrarenal pelvis. An upper pole left renal lesion is most apparent on coronal image 82. Measures 7 mm and approximately 56 HU. Not readily apparent on the prior. Multiple left renal collecting system calculi, on the order of 8 mm or less. Bilateral renal cortical thinning is mild. Mild to moderate left-sided hydronephrosis secondary to a 4 mm stone at the left ureteropelvic  junction. No distal ureteric stones. No bladder calculi.  Stomach/Bowel: Normal distal stomach. Colonic stool burden suggests constipation. Surgical site sugars in the region of the ileocecal junction.  Vascular/Lymphatic: Normal caliber of the aorta and branch vessels. No abdominopelvic adenopathy.  Reproductive: Prostatic calcifications.  Other: No significant free fluid.  Musculoskeletal: Osteopenia. Moderate convex left lumbar spine curvature.  IMPRESSION: 1. Mild to moderate left-sided hydroureteronephrosis secondary to a 4 mm left ureteropelvic junction stone. 2. Otherwise, multifactorial degradation as detailed above. 3. Left nephrolithiasis with bilateral renal cortical thinning. 4. 7 mm upper pole left renal lesion which is indeterminate. Given mild hyperattenuation, a hemorrhagic cyst is favored. Although the most definitive characterization would be with pre and post contrast abdominal MRI, given patient's clinical status, ultrasound followup at 1 year is a suggested strategy. 5. Moderate hiatal hernia. 6. Bilateral gynecomastia. 7.  Possible  constipation.   Electronically Signed   By: Abigail Miyamoto M.D.   On: 12/14/2014 10:46   Ct Head Wo Contrast  12/14/2014   CLINICAL DATA:  Altered mental status, and decreased alertness in activity, not eating, history cerebral palsy, deafness, blindness  EXAM: CT HEAD WITHOUT CONTRAST  TECHNIQUE: Contiguous axial images were obtained from the base of the skull through the vertex without intravenous contrast.  COMPARISON:  12/08/2009  FINDINGS: Generalized atrophy.  Ex vacuo dilatation of the ventricular system, stable, consistent with degree of atrophy.  No midline shift or mass effect.  Stable appearance of brain parenchyma.  No evidence of mass lesion, intracranial hemorrhage or acute infarction.  Scattered dural calcifications in falx.  Partial opacification of RIGHT ethmoid air cells.  No acute osseous or sinus abnormalities otherwise seen.  IMPRESSION: Chronic  atrophy and ventriculomegaly, stable.  No acute abnormalities.   Electronically Signed   By: Lavonia Dana M.D.   On: 12/14/2014 10:29   [place] Intraoperative  12/15/2014   CLINICAL DATA:    Ultrasound was provided for use by the ordering physician, and a technical  charge was applied by the performing facility.  No radiologist  interpretation/professional services rendered.    Dg Chest Port 1 View  12/14/2014   CLINICAL DATA:  Altered mental status/confusion  EXAM: PORTABLE CHEST - 1 VIEW  COMPARISON:  December 08, 2009  FINDINGS: There is focal airspace consolidation in the left base. Lungs elsewhere clear. Heart size and pulmonary vascularity are normal. No adenopathy. There is upper thoracic levoscoliosis.  IMPRESSION: Left base airspace consolidation consistent with pneumonia. Lungs otherwise clear. Followup PA and lateral chest radiographs recommended in 3-4 weeks following trial of antibiotic therapy to ensure resolution and exclude underlying malignancy.   Electronically Signed   By: Lowella Grip III M.D.   On: 12/14/2014 10:55   Ir Nephrostomy Placement Left  12/15/2014   CLINICAL DATA:  Obstructing left UPJ calculus, hydronephrosis  EXAM: LEFT PERCUTANEOUS NEPHROSTOMY CATHETER PLACEMENT UNDER ULTRASOUND AND FLUOROSCOPIC GUIDANCE  FLUOROSCOPY TIME:  1.2 Minutes  TECHNIQUE: The procedure, risks (including but not limited to bleeding, infection, organ damage ), benefits, and alternatives were explained to the family. Questions regarding the procedure were encouraged and answered. The family understands and consents to the procedure.  Patient placed prone. LeftFlank region prepped with Betadine, draped in usual sterile fashion, infiltrated locally with 1% lidocaine.  Intravenous Fentanyl and Versed were administered as conscious sedation during continuous cardiorespiratory monitoring by the radiology RN, with a total moderate sedation time of less than 30 minutes.  Under real-time ultrasound guidance, a  21-gauge micropuncture needle was advanced into a posterior lower pole calyx. Ultrasound image documentation was saved. Urine spontaneously returned through the needle. Needle was exchanged over a guidewire for transitional dilator. Contrast injection confirmed appropriate positioning. 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 Catheter was exchanged over a guidewire for a 10 French Dawson Mueller pigtail catheter, formed centrally within the left renal collecting system. Contrast injection confirms appropriate positioning and patency. Catheter secured externally with 0 Prolene suture and placed to external drain bag.  COMPLICATIONS: COMPLICATIONS none  IMPRESSION: 1. Technically successful left percutaneous nephrostomy catheter placement.   Electronically Signed   By: Lucrezia Europe M.D.   On: 12/15/2014 15:10    Microbiology: No results found for this or any previous visit (from the past 240 hour(s)).   Labs: Basic Metabolic Panel:  Recent Labs Lab 12/14/14 0942 12/16/14 0432 12/18/14 0521  NA 139 140  139  K 3.9 4.1 3.0*  CL 105 109 109  CO2 26 28 28   GLUCOSE 66 100* 97  BUN 12 14 12   CREATININE 0.93 0.99 0.86  CALCIUM 9.1 8.3* 8.1*  MG  --   --  1.6*   Liver Function Tests:  Recent Labs Lab 12/14/14 0942 12/16/14 0432 12/18/14 0521  AST 19 12* 18  ALT 18 13* 20  ALKPHOS 75 62 69  BILITOT 0.4 0.4 0.4  PROT 7.3 5.9* 5.7*  ALBUMIN 3.7 2.9* 2.8*    Recent Labs Lab 12/14/14 0942  LIPASE 46    Recent Labs Lab 12/14/14 0942  AMMONIA 29   CBC:  Recent Labs Lab 12/14/14 0942 12/16/14 0432 12/18/14 0521  WBC 5.4 7.1 7.8  NEUTROABS 3.6  --  5.6  HGB 14.2 12.6* 12.2*  HCT 43.2 38.0* 36.0*  MCV 96.9 95.8 95.2  PLT 152 166 160   Cardiac Enzymes:  Recent Labs Lab 12/16/14 0758  TROPONINI <0.03   BNP: BNP (last 3 results) No results for input(s): BNP in the last 8760 hours.  ProBNP (last 3 results) No results for input(s): PROBNP in the last 8760  hours.  CBG:  Recent Labs Lab 12/14/14 0943 12/14/14 1215  GLUCAP 82 82       Signed:  [place] III   12/18/2014, 12:55 PM

## 2014-12-18 NOTE — Clinical Social Work Note (Signed)
Patient is to discharge today to return to [facility]. Butch Penny with [name] aware and has spoken to patient's nurse today as well. Nephrostomy tube care instructions and follow up appts placed on the FL2. EMS form prepared in the event that the [facility] is unable to transport. Patient's nurse is aware. Shela Leff MSW,LCSWA [phone]

## 2014-12-22 ENCOUNTER — Emergency Department
Admission: EM | Admit: 2014-12-22 | Discharge: 2014-12-22 | Disposition: A | Discharge status: Home / Self Care | Payer: Medicare Other | Attending: Emergency Medicine | Admitting: Emergency Medicine

## 2014-12-22 ENCOUNTER — Emergency Department
Admit: 2014-12-22 | Discharge: 2014-12-22 | Disposition: A | Discharge status: Home / Self Care | Payer: Medicare Other | Attending: Emergency Medicine | Admitting: Emergency Medicine

## 2014-12-22 ENCOUNTER — Encounter: Payer: Self-pay | Admitting: Emergency Medicine

## 2014-12-22 DIAGNOSIS — Z7951 Long term (current) use of inhaled steroids: Secondary | ICD-10-CM | POA: Diagnosis not present

## 2014-12-22 DIAGNOSIS — Y832 Surgical operation with anastomosis, bypass or graft as the cause of abnormal reaction of the patient, or of later complication, without mention of misadventure at the time of the procedure: Secondary | ICD-10-CM | POA: Insufficient documentation

## 2014-12-22 DIAGNOSIS — N1330 Unspecified hydronephrosis: Secondary | ICD-10-CM

## 2014-12-22 DIAGNOSIS — N99528 Other complication of other external stoma of urinary tract: Secondary | ICD-10-CM | POA: Diagnosis not present

## 2014-12-22 DIAGNOSIS — I10 Essential (primary) hypertension: Secondary | ICD-10-CM | POA: Diagnosis not present

## 2014-12-22 DIAGNOSIS — T83022A Displacement of nephrostomy catheter, initial encounter: Secondary | ICD-10-CM

## 2014-12-22 DIAGNOSIS — Z79899 Other long term (current) drug therapy: Secondary | ICD-10-CM | POA: Insufficient documentation

## 2014-12-22 MED ORDER — SODIUM CHLORIDE 0.9 % IJ SOLN
INTRAMUSCULAR | Status: AC
  Filled 2014-12-22: qty 50

## 2014-12-22 MED ORDER — FENTANYL CITRATE (PF) 100 MCG/2ML IJ SOLN
INTRAMUSCULAR | Status: DC | PRN
  Administered 2014-12-22: 25 ug via INTRAVENOUS

## 2014-12-22 MED ORDER — SODIUM CHLORIDE 0.9 % IV SOLN: INTRAVENOUS | Status: DC

## 2014-12-22 MED ORDER — IOHEXOL 300 MG/ML  SOLN: 5.0000 mL | Freq: Once | INTRAMUSCULAR | Status: AC | PRN

## 2014-12-22 NOTE — Procedures (Signed)
Left nephrostomy injected with contrast under flouroscopy. Catheter in good position. No complications. Stat-lock placed at request of family.

## 2014-12-22 NOTE — ED Notes (Signed)
Dr Nyoka Cowden at beside, consent to procedure discussed. POA Brother signed consent form.

## 2014-12-22 NOTE — ED Notes (Signed)
Caregiver at bedside states pt likely rolled over in the night and when he woke this AM nephrostomy tube had come out a little, sutures remain in place.

## 2014-12-22 NOTE — ED Notes (Signed)
Pt to ed with caregiver who reports pt had surgery on kidneys and tube was placed.  Caregiver reports this am when he awoke it was handing out.  Pt is full care,nonverbal, deaf, and blind from [facility].

## 2014-12-22 NOTE — Sedation Documentation (Signed)
Nephrostogram only-stat lock placed.  See radiologist's note.

## 2014-12-22 NOTE — ED Provider Notes (Signed)
[facility] Emergency Department Provider Note    ____________________________________________  Time seen: 0900  I have reviewed the triage vital signs and the nursing notes.   HISTORY  Chief Complaint Tube fell out    History limited by: Patient nonverbal, history from caregiver and medical chart   HPI Shawn Sweeney is a 59 y.o. male who presents to the emergency department today because of concerns for left nephrostomy tube. The patient had a nephrostomy tube placed roughly 1 week ago secondary to obstructing stone on the left side. Caregiver states that this morning when nurse checked in on him she noticed that the nephrostomy tube and pulled out. They believe that it is still draining appropriately. They have not noticed any recent change in behavior, fevers.      Past Medical History  Diagnosis Date  . Hypertension   . Nephrolithiasis   . Cerebral palsy   . Congenital deafness   . BPH (benign prostatic hyperplasia)   . Adenocarcinoma of cecum   . Anemia   . Blind   . Mood disorder   . Onychomycosis   . Pneumonia 12/18/2014    Patient Active Problem List   Diagnosis Date Noted  . Pneumonia 12/18/2014  . Hypotension 12/15/2014  . Bradycardia 12/15/2014  . Cerebral palsy with spastic/ataxic diplegia 12/15/2014  . 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 Blindness 12/15/2014  . Altered mental status 12/14/2014  . Hydronephrosis, left 12/14/2014  . Nephrolithiasis 12/14/2014    Class: Chronic    Past Surgical History  Procedure Laterality Date  . Colon surgery    . Diagnostic laparoscopy    . Cholecystectomy    . Colectomy      Current Outpatient Rx  Name  Route  Sig  Dispense  Refill  . ARIPiprazole (ABILIFY) 2 MG tablet   Oral   Take 1 mg by mouth every morning.          . clonazePAM (KLONOPIN) 0.5 MG tablet   Oral   Take 0.25 mg by mouth 3 (three) times daily.         Marland Kitchen dutasteride (AVODART) 0.5 MG capsule   Oral  Take 1 capsule by mouth every morning.          . Ferrous Sulfate (IRON) 142 (45 FE) MG TBCR   Oral   Take 45 mg by mouth 2 (two) times a week.         . fexofenadine (ALLEGRA) 180 MG tablet   Oral   Take 180 mg by mouth daily as needed for allergies or rhinitis.         Marland Kitchen FLUoxetine (PROZAC) 20 MG capsule   Oral   Take 20 mg by mouth every morning.          . fluticasone (FLONASE) 50 MCG/ACT nasal spray   Each Nare   Place 2 sprays into both nostrils at bedtime.         . furosemide (LASIX) 20 MG tablet   Oral   Take 20 mg by mouth daily as needed for edema.          . hydrocortisone (ANUSOL-HC) 25 MG suppository   Rectal   Place 25 mg rectally every 12 (twelve) hours as needed (rectal bleeding).          . Multiple Vitamin (MULTI-VITAMINS) TABS   Oral   Take 1 tablet by mouth daily.         Marland Kitchen omeprazole (PRILOSEC) 20 MG capsule  Oral   Take 20 mg by mouth 2 (two) times daily.         Marland Kitchen Phenylephrine-DM-GG (MUCINEX FAST-MAX CONGEST COUGH) 2.5-5-100 MG/5ML LIQD   Oral   Take 5 mLs by mouth every 6 (six) hours as needed (cough and congestion).         . potassium chloride (K-DUR) 10 MEQ tablet   Oral   Take 10 mEq by mouth daily as needed (edema). Take with lasix         . tamsulosin (FLOMAX) 0.4 MG CAPS capsule   Oral   Take 1 capsule by mouth daily.           Allergies Risperidone and Levofloxacin  Family History  Problem Relation Age of Onset  . CAD Father   . Prostate cancer Father   . Colon cancer Father     Social History History  Substance Use Topics  . Smoking status: Never Smoker   . Smokeless tobacco: Not on file  . Alcohol Use: No    Review of Systems  Unable to obtain secondary to nonverbal  ____________________________________________   PHYSICAL EXAM:  VITAL SIGNS: ED Triage Vitals  Enc Vitals Group     BP 12/22/14 0842 85/44 mmHg     Pulse Rate 12/22/14 0842 72     Resp 12/22/14 0842 20     Temp  12/22/14 0842 95.8 F (35.4 C)     Temp Source 12/22/14 0842 Axillary     SpO2 12/22/14 0842 100 %     Weight 12/22/14 0842 85 lb (38.556 kg)     Height --      Head Cir --      Peak Flow --      Pain Score --      Pain Loc --      Pain Edu? --      Excl. in [place]? --      Constitutional: Awake, alert  Eyes: Conjunctivae are normal. PERRL. Normal extraocular movements. ENT   Head: Normocephalic and atraumatic.   Nose: No congestion/rhinnorhea.   Mouth/Throat: Mucous membranes are moist.   Neck: No stridor. Hematological/Lymphatic/Immunilogical: No cervical lymphadenopathy. Cardiovascular: Normal rate, regular rhythm.   Respiratory: Normal respiratory effort without tachypnea nor retractions. Gastrointestinal: Soft and nontender. No distention.  Genitourinary: Nephrostomy tube on left side. There is urine in the tubing and bag. Based on where the external sutures are on the tube it appears the tube is pulled out roughly 1 inch. Musculoskeletal: Atrophied extremities cold and some contraction Neurologic:  Awake, nonverbal Skin:  Skin is warm, dry and intact. No rash noted.   ____________________________________________    LABS (pertinent positives/negatives)  None  ____________________________________________   EKG  None  ____________________________________________    RADIOLOGY  Taken to the CT for evaluation of nephrostomy tube  ____________________________________________   PROCEDURES  Procedure(s) performed: None  Critical Care performed: No  ____________________________________________   INITIAL IMPRESSION / ASSESSMENT AND PLAN / ED COURSE  Pertinent labs & imaging results that were available during my care of the patient were reviewed by me and considered in my medical decision making (see chart for details).  Patient presents to the emergency department today with concern for nephrostomy tube being pulled out. I have discussed with  radiology who will try to take him to the CT scanner to assess tube.  ____________________________________________   FINAL CLINICAL IMPRESSION(S) / ED DIAGNOSES  Final diagnoses:  Nephrostomy tube displaced     Nance Pear, MD 12/22/14  1523 

## 2014-12-22 NOTE — Discharge Instructions (Signed)
Please seek medical attention for any high fevers, chest pain, shortness of breath, change in behavior, persistent vomiting, bloody stool or any other new or concerning symptoms.  Dietary Guidelines to Help Prevent Kidney Stones Your risk of kidney stones can be decreased by adjusting the foods you eat. The most important thing you can do is drink enough fluid. You should drink enough fluid to keep your urine clear or pale yellow. The following guidelines provide specific information for the type of kidney stone you have had. GUIDELINES ACCORDING TO TYPE OF KIDNEY STONE Calcium Oxalate Kidney Stones  Reduce the amount of salt you eat. Foods that have a lot of salt cause your body to release excess calcium into your urine. The excess calcium can combine with a substance called oxalate to form kidney stones.  Reduce the amount of animal protein you eat if the amount you eat is excessive. Animal protein causes your body to release excess calcium into your urine. Ask your dietitian how much protein from animal sources you should be eating.  Avoid foods that are high in oxalates. If you take vitamins, they should have less than 500 mg of vitamin C. Your body turns vitamin C into oxalates. You do not need to avoid fruits and vegetables high in vitamin C. Calcium Phosphate Kidney Stones  Reduce the amount of salt you eat to help prevent the release of excess calcium into your urine.  Reduce the amount of animal protein you eat if the amount you eat is excessive. Animal protein causes your body to release excess calcium into your urine. Ask your dietitian how much protein from animal sources you should be eating.  Get enough calcium from food or take a calcium supplement (ask your dietitian for recommendations). Food sources of calcium that do not increase your risk of kidney stones include:  Broccoli.  Dairy products, such as cheese and yogurt.  Pudding. Uric Acid Kidney Stones  Do not have more  than 6 oz of animal protein per day. FOOD SOURCES Animal Protein Sources  Meat (all types).  Poultry.  Eggs.  Fish, seafood. Foods High in [employer] seasonings.  Soy sauce.  Teriyaki sauce.  Cured and processed meats.  Salted crackers and snack foods.  Fast food.  Canned soups and most canned foods. Foods High in Oxalates  Grains:  Amaranth.  Barley.  Grits.  Wheat germ.  Bran.  Buckwheat flour.  All bran cereals.  Pretzels.  Whole wheat bread.  Vegetables:  Beans (wax).  Beets and beet greens.  Collard greens.  Eggplant.  Escarole.  Leeks.  Okra.  Parsley.  Rutabagas.  Spinach.  Swiss chard.  Tomato paste.  Fried potatoes.  Sweet potatoes.  Fruits:  Red currants.  Figs.  Kiwi.  Rhubarb.  Meat and Other Protein Sources:  Beans (dried).  Soy burgers and other soybean products.  Miso.  Nuts (peanuts, almonds, pecans, cashews, hazelnuts).  Nut butters.  Sesame seeds and tahini (paste made of sesame seeds).  Poppy seeds.  Beverages:  Chocolate drink mixes.  Soy milk.  Instant iced tea.  Juices made from high-oxalate fruits or vegetables.  Other:  Carob.  Chocolate.  Fruitcake.  Marmalades. Document Released: 10/11/2010 Document Revised: 06/21/2013 Document Reviewed: 05/13/2013 [facility] Patient Information 2015 [place], [place]. This information is not intended to replace advice given to you by your health care provider. Make sure you discuss any questions you have with your health care provider.

## 2015-01-11 ENCOUNTER — Other Ambulatory Visit: Payer: Self-pay | Admitting: Urology

## 2015-01-11 DIAGNOSIS — N1330 Unspecified hydronephrosis: Secondary | ICD-10-CM

## 2015-01-18 ENCOUNTER — Other Ambulatory Visit: Payer: Self-pay | Admitting: Radiology

## 2015-01-19 ENCOUNTER — Ambulatory Visit
Admission: RE | Admit: 2015-01-19 | Discharge: 2015-01-19 | Disposition: A | Discharge status: Home / Self Care | Payer: Medicare Other | Source: Ambulatory Visit | Attending: Urology | Admitting: Urology

## 2015-01-19 DIAGNOSIS — Z466 Encounter for fitting and adjustment of urinary device: Secondary | ICD-10-CM | POA: Insufficient documentation

## 2015-01-19 DIAGNOSIS — N1330 Unspecified hydronephrosis: Secondary | ICD-10-CM

## 2015-01-19 DIAGNOSIS — N200 Calculus of kidney: Secondary | ICD-10-CM | POA: Diagnosis present

## 2015-01-19 MED ORDER — CLONAZEPAM 0.5 MG PO TABS
0.5000 mg | ORAL_TABLET | Freq: Once | ORAL | Status: AC
  Administered 2015-01-19: 0.5 mg via ORAL

## 2015-01-19 MED ORDER — IOHEXOL 300 MG/ML  SOLN
25.0000 mL | Freq: Once | INTRAMUSCULAR | Status: AC | PRN
  Administered 2015-01-19: 5 mL

## 2015-01-19 NOTE — Discharge Instructions (Signed)
Change dressing as needed. Should not need dressing after couple days. Watch for signs and symptoms of infection, notify md if develop fever of greater than 100 F, or malodorous drainage from former tube site.

## 2015-01-19 NOTE — Procedures (Signed)
Interventional Radiology Procedure Note  Procedure: Left PCN, through the tube antegrade nephrostogram.   Removal of left PCN. Findings:  Left double J stent.  The Left PCN is within the collecting system.  Antegrade flow of contrast through the double J. Complications: None Recommendations:  - Maintain urology appointments - Routine dressing changes at the skin site until no drainage.    Signed,  Dulcy Fanny. Earleen Newport, DO

## 2015-01-19 NOTE — H&P (Signed)
Chief Complaint: Left percutaneous PCN.    Referring Physician(s): Stoioff,Scott C  History of Present Illness: Shawn Sweeney is a 59 y.o. male with CP, and progressive hearing loss and vision loss.    He had an episode of left ureter blockage secondary to stone disease in June, with PCN placed 12/15/2014.  He has subsequently had one PCN exchange, and Dr. Bernardo Heater has recently performed a stent placemen and lithotripsy/lithotomy.    He presents today for antegrade nephrostogram and possible tube removal.  This was discussed with Dr. Bernardo Heater.      Past Medical History  Diagnosis Date  . Hypertension   . Nephrolithiasis   . Cerebral palsy   . Congenital deafness   . BPH (benign prostatic hyperplasia)   . Adenocarcinoma of cecum   . Anemia   . Blind   . Mood disorder   . Onychomycosis   . Pneumonia 12/18/2014    Past Surgical History  Procedure Laterality Date  . Colon surgery    . Diagnostic laparoscopy    . Cholecystectomy    . Colectomy      Allergies: Risperidone and Levofloxacin  Medications: Prior to Admission medications   Medication Sig Start Date End Date Taking? Authorizing Provider  ARIPiprazole (ABILIFY) 2 MG tablet Take 1 mg by mouth every morning.     Historical Provider, MD  clonazePAM (KLONOPIN) 0.5 MG tablet Take 0.25 mg by mouth 3 (three) times daily. 06/15/12   Historical Provider, MD  dutasteride (AVODART) 0.5 MG capsule Take 1 capsule by mouth every morning.     Historical Provider, MD  Ferrous Sulfate (IRON) 142 (45 FE) MG TBCR Take 45 mg by mouth 2 (two) times a week.    Historical Provider, MD  fexofenadine (ALLEGRA) 180 MG tablet Take 180 mg by mouth daily as needed for allergies or rhinitis.    Historical Provider, MD  FLUoxetine (PROZAC) 20 MG capsule Take 20 mg by mouth every morning.     Historical Provider, MD  fluticasone (FLONASE) 50 MCG/ACT nasal spray Place 2 sprays into both nostrils at bedtime. 06/15/12   Historical Provider, MD    furosemide (LASIX) 20 MG tablet Take 20 mg by mouth daily as needed for edema.     Historical Provider, MD  hydrocortisone (ANUSOL-HC) 25 MG suppository Place 25 mg rectally every 12 (twelve) hours as needed (rectal bleeding).     Historical Provider, MD  Multiple Vitamin (MULTI-VITAMINS) TABS Take 1 tablet by mouth daily.    Historical Provider, MD  omeprazole (PRILOSEC) 20 MG capsule Take 20 mg by mouth 2 (two) times daily.    Historical Provider, MD  Phenylephrine-DM-GG (MUCINEX FAST-MAX CONGEST COUGH) 2.5-5-100 MG/5ML LIQD Take 5 mLs by mouth every 6 (six) hours as needed (cough and congestion).    Historical Provider, MD  potassium chloride (K-DUR) 10 MEQ tablet Take 10 mEq by mouth daily as needed (edema). Take with lasix    Historical Provider, MD  tamsulosin (FLOMAX) 0.4 MG CAPS capsule Take 1 capsule by mouth daily. 06/15/12   Historical Provider, MD     Family History  Problem Relation Age of Onset  . CAD Father   . Prostate cancer Father   . Colon cancer Father     History   Social History  . Marital Status: Single    Spouse Name: N/A  . Number of Children: N/A  . Years of Education: N/A   Social History Main Topics  . Smoking status: Never Smoker   .  Smokeless tobacco: Not on file  . Alcohol Use: No  . Drug Use: No  . Sexual Activity: Not on file   Other Topics Concern  . Not on file   Social History Narrative       Review of Systems: A 12 point ROS discussed and pertinent positives are indicated in the HPI above.  All other systems are negative.  Review of Systems  Vital Signs: There were no vitals taken for this visit.  Physical Exam  Mallampati Score:  2    Imaging: Ir Nephrostomy Exchange Left  12/22/2014   CLINICAL DATA:  Left nephrostomy pulled out.  EXAM: IR EXCHANGE NEPHROSTOMY LEFT  MEDICATIONS: 25 mcg fentanyl IV.  CONTRAST:  5 mL of Omnipaque 300.  FLUOROSCOPY TIME:  Fluoroscopy Time (in minutes and seconds): 48 seconds.  Number of  Acquired Images:  3.  PROCEDURE: The risks and benefits of this procedure were discussed with the patient's power-of-attorney and informed consent was obtained. Patient was placed prone on angiographic table. Contrast was injected through pre-existing left nephrostomy catheter. This demonstrates catheter tip to be well positioned within the left intrarenal collecting system. No hydronephrosis is noted. Antegrade flow into left ureter is noted. Catheter was flushed with saline. Appropriate dressing was applied.  Complications: None immediate.  FINDINGS: Left nephrostomy catheter is well positioned within the left intrarenal collecting system.  IMPRESSION: Under fluoroscopic guidance, contrast injection of pre-existing left nephrostomy catheter demonstrates tip to be well situated within the left intra renal collecting system.   Electronically Signed   By: Marijo Conception, M.D.   On: 12/22/2014 14:21    Labs:  CBC:  Recent Labs  12/14/14 0942 12/16/14 0432 12/18/14 0521  WBC 5.4 7.1 7.8  HGB 14.2 12.6* 12.2*  HCT 43.2 38.0* 36.0*  PLT 152 166 160    COAGS: No results for input(s): INR, APTT in the last 8760 hours.  BMP:  Recent Labs  12/14/14 0942 12/16/14 0432 12/18/14 0521  NA 139 140 139  K 3.9 4.1 3.0*  CL 105 109 109  CO2 26 28 28   GLUCOSE 66 100* 97  BUN 12 14 12   CALCIUM 9.1 8.3* 8.1*  CREATININE 0.93 0.99 0.86  GFRNONAA >60 >60 >60  GFRAA >60 >60 >60    LIVER FUNCTION TESTS:  Recent Labs  12/14/14 0942 12/16/14 0432 12/18/14 0521  BILITOT 0.4 0.4 0.4  AST 19 12* 18  ALT 18 13* 20  ALKPHOS 75 62 69  PROT 7.3 5.9* 5.7*  ALBUMIN 3.7 2.9* 2.8*    TUMOR MARKERS: No results for input(s): AFPTM, CEA, CA199, CHROMGRNA in the last 8760 hours.  Assessment and Plan:  Left PCN antegrade nephrostogram, with possible stent removal.       Signed: Malisa Ruggiero 01/19/2015, 9:33 AM   I spent a total of    10 Minutes in face to face in clinical consultation,  greater than 50% of which was counseling/coordinating care for left PCN, nephrolithiasis.

## 2015-08-21 DIAGNOSIS — Z85038 Personal history of other malignant neoplasm of large intestine: Secondary | ICD-10-CM | POA: Insufficient documentation

## 2015-08-21 DIAGNOSIS — R1314 Dysphagia, pharyngoesophageal phase: Secondary | ICD-10-CM | POA: Insufficient documentation

## 2015-10-05 ENCOUNTER — Encounter: Payer: Self-pay | Admitting: *Deleted

## 2015-10-08 ENCOUNTER — Ambulatory Visit: Payer: Medicare Other | Admitting: *Deleted

## 2015-10-08 ENCOUNTER — Encounter: Payer: Self-pay | Admitting: *Deleted

## 2015-10-08 ENCOUNTER — Encounter
Admission: RE | Disposition: A | Discharge status: Home / Self Care | Payer: Self-pay | Source: Ambulatory Visit | Attending: Gastroenterology

## 2015-10-08 ENCOUNTER — Ambulatory Visit
Admission: RE | Admit: 2015-10-08 | Discharge: 2015-10-08 | Disposition: A | Discharge status: Home / Self Care | Payer: Medicare Other | Source: Ambulatory Visit | Attending: Gastroenterology | Admitting: Gastroenterology

## 2015-10-08 DIAGNOSIS — N400 Enlarged prostate without lower urinary tract symptoms: Secondary | ICD-10-CM | POA: Insufficient documentation

## 2015-10-08 DIAGNOSIS — H9190 Unspecified hearing loss, unspecified ear: Secondary | ICD-10-CM | POA: Insufficient documentation

## 2015-10-08 DIAGNOSIS — B351 Tinea unguium: Secondary | ICD-10-CM | POA: Insufficient documentation

## 2015-10-08 DIAGNOSIS — Z888 Allergy status to other drugs, medicaments and biological substances status: Secondary | ICD-10-CM | POA: Diagnosis not present

## 2015-10-08 DIAGNOSIS — Z8249 Family history of ischemic heart disease and other diseases of the circulatory system: Secondary | ICD-10-CM | POA: Diagnosis not present

## 2015-10-08 DIAGNOSIS — G809 Cerebral palsy, unspecified: Secondary | ICD-10-CM | POA: Diagnosis not present

## 2015-10-08 DIAGNOSIS — Z8042 Family history of malignant neoplasm of prostate: Secondary | ICD-10-CM | POA: Insufficient documentation

## 2015-10-08 DIAGNOSIS — I10 Essential (primary) hypertension: Secondary | ICD-10-CM | POA: Insufficient documentation

## 2015-10-08 DIAGNOSIS — N471 Phimosis: Secondary | ICD-10-CM | POA: Insufficient documentation

## 2015-10-08 DIAGNOSIS — Z1211 Encounter for screening for malignant neoplasm of colon: Secondary | ICD-10-CM | POA: Insufficient documentation

## 2015-10-08 DIAGNOSIS — Z800 Family history of malignant neoplasm of digestive organs: Secondary | ICD-10-CM | POA: Insufficient documentation

## 2015-10-08 DIAGNOSIS — H540 Blindness, both eyes: Secondary | ICD-10-CM | POA: Insufficient documentation

## 2015-10-08 DIAGNOSIS — Z9049 Acquired absence of other specified parts of digestive tract: Secondary | ICD-10-CM | POA: Insufficient documentation

## 2015-10-08 DIAGNOSIS — K2270 Barrett's esophagus without dysplasia: Secondary | ICD-10-CM | POA: Insufficient documentation

## 2015-10-08 DIAGNOSIS — F39 Unspecified mood [affective] disorder: Secondary | ICD-10-CM | POA: Insufficient documentation

## 2015-10-08 DIAGNOSIS — R1310 Dysphagia, unspecified: Secondary | ICD-10-CM | POA: Diagnosis not present

## 2015-10-08 DIAGNOSIS — Z87442 Personal history of urinary calculi: Secondary | ICD-10-CM | POA: Insufficient documentation

## 2015-10-08 DIAGNOSIS — Z85038 Personal history of other malignant neoplasm of large intestine: Secondary | ICD-10-CM | POA: Diagnosis present

## 2015-10-08 HISTORY — DX: Tinea unguium: B35.1

## 2015-10-08 HISTORY — DX: Calculus of gallbladder with acute cholecystitis with obstruction: K80.01

## 2015-10-08 HISTORY — DX: Elevated prostate specific antigen (PSA): R97.20

## 2015-10-08 HISTORY — PX: COLONOSCOPY WITH PROPOFOL: SHX5780

## 2015-10-08 HISTORY — DX: Localized edema: R60.0

## 2015-10-08 HISTORY — DX: Ventral hernia without obstruction or gangrene: K43.9

## 2015-10-08 HISTORY — PX: ESOPHAGOGASTRODUODENOSCOPY (EGD) WITH PROPOFOL: SHX5813

## 2015-10-08 HISTORY — DX: Phimosis: N47.1

## 2015-10-08 SURGERY — ESOPHAGOGASTRODUODENOSCOPY (EGD) WITH PROPOFOL
Anesthesia: General

## 2015-10-08 MED ORDER — PROPOFOL 500 MG/50ML IV EMUL
INTRAVENOUS | Status: DC | PRN
  Administered 2015-10-08: 25 ug/kg/min via INTRAVENOUS

## 2015-10-08 MED ORDER — LIDOCAINE HCL (PF) 2 % IJ SOLN
INTRAMUSCULAR | Status: DC | PRN
  Administered 2015-10-08: 40 mg via INTRADERMAL

## 2015-10-08 MED ORDER — PROPOFOL 10 MG/ML IV BOLUS
INTRAVENOUS | Status: DC | PRN
  Administered 2015-10-08: 10 mg via INTRAVENOUS

## 2015-10-08 MED ORDER — SODIUM CHLORIDE 0.9 % IV SOLN
INTRAVENOUS | Status: DC
  Administered 2015-10-08: 09:00:00 via INTRAVENOUS

## 2015-10-08 MED ORDER — SODIUM CHLORIDE 0.9 % IV SOLN: INTRAVENOUS | Status: DC

## 2015-10-08 NOTE — Transfer of Care (Signed)
Immediate Anesthesia Transfer of Care Note  Patient: Shawn Sweeney  Procedure(s) Performed: Procedure(s): ESOPHAGOGASTRODUODENOSCOPY (EGD) WITH PROPOFOL (N/A) COLONOSCOPY WITH PROPOFOL (N/A)  Patient Location: PACU  Anesthesia Type:General  Level of Consciousness: responds to stimulation, sleeping  Airway & Oxygen Therapy: Patient Spontanous Breathing and Patient connected to nasal cannula oxygen  Post-op Assessment: Report given to RN and Post -op Vital signs reviewed and stable  Post vital signs: Reviewed and stable  Last Vitals:  Filed Vitals:   10/08/15 0906 10/08/15 1003  BP: 109/88 99/57  Pulse: 114 52  Temp: 36.1 C   Resp: 20 13    Complications: No apparent anesthesia complications

## 2015-10-08 NOTE — Op Note (Signed)
[facility]enter[facility] Patient Name: Shawn Sweeney Procedure Date: 10/08/2015 9:31 AM MRN: [id number] Account #: [id number] Date of Birth: January 01, 1956 Admit Type: Outpatient Age: 60 Room: [facility] ENDO ROOM 4 Gender: Male Note Status: Finalized Procedure:            Colonoscopy Indications:          Personal history of malignant neoplasm of the colon Providers:            Lupita Dawn. Candace Cruise, MD Referring MD:         Ramonita Lab, MD (Referring MD) Medicines:            Monitored Anesthesia Care Complications:        No immediate complications. Procedure:            Pre-Anesthesia Assessment:                       - Prior to the procedure, a History and Physical was                        performed, and patient medications, allergies and                        sensitivities were reviewed. The patient's tolerance of                        previous anesthesia was reviewed.                       - The risks and benefits of the procedure and the                        sedation options and risks were discussed with the                        patient. All questions were answered and informed                        consent was obtained.                       - After reviewing the risks and benefits, the patient                        was deemed in satisfactory condition to undergo the                        procedure.                       After obtaining informed consent, the colonoscope was                        passed under direct vision. Throughout the procedure,                        the patient's blood pressure, pulse, and oxygen                        saturations were monitored continuously. The  Colonoscope was introduced through the anus and                        advanced to the the cecum, identified by appendiceal                        orifice and ileocecal valve. The colonoscopy was                        performed without difficulty. The  patient tolerated the                        procedure well. The quality of the bowel preparation                        was good. Findings:      The colon (entire examined portion) appeared normal. Impression:           - The entire examined colon is normal.                       - No specimens collected. Recommendation:       - Discharge patient to home.                       - Repeat colonoscopy in 5 years for surveillance.                       - The findings and recommendations were discussed with                        the patient. Procedure Code(s):    --- Professional ---                       [phone], Colonoscopy, flexible; diagnostic, including                        collection of specimen(s) by brushing or washing, when                        performed (separate procedure) Diagnosis Code(s):    --- Professional ---                       [id number], Personal history of other malignant neoplasm                        of large intestine CPT copyright 2016 American Medical Association. All rights reserved. The codes documented in this report are preliminary and upon coder review may  be revised to meet current compliance requirements. Hulen Luster, MD 10/08/2015 10:01:34 AM This report has been signed electronically. Number of Addenda: 0 Note Initiated On: 10/08/2015 9:31 AM Scope Withdrawal Time: 0 hours 4 minutes 24 seconds  Total Procedure Duration: 0 hours 12 minutes 52 seconds       [facility]

## 2015-10-08 NOTE — Anesthesia Postprocedure Evaluation (Signed)
Anesthesia Post Note  Patient: Shawn Sweeney  Procedure(s) Performed: Procedure(s) (LRB): ESOPHAGOGASTRODUODENOSCOPY (EGD) WITH PROPOFOL (N/A) COLONOSCOPY WITH PROPOFOL (N/A)  Patient location during evaluation: Endoscopy Anesthesia Type: General Level of consciousness: awake Pain management: pain level controlled Vital Signs Assessment: post-procedure vital signs reviewed and stable Respiratory status: spontaneous breathing Cardiovascular status: blood pressure returned to baseline Postop Assessment: no headache Anesthetic complications: no    Last Vitals:  Filed Vitals:   10/08/15 1023 10/08/15 1033  BP: 96/53 104/63  Pulse: 51 47  Temp:    Resp: 12 13    Last Pain: There were no vitals filed for this visit.               Karma Hiney M

## 2015-10-08 NOTE — H&P (Signed)
Primary Care Physician:  Adin Hector, MD Primary Gastroenterologist:  Dr. Candace Cruise  Pre-Procedure History & Physical: HPI:  Shawn Sweeney is a 60 y.o. male is here for an EGD/colonoscopy   Past Medical History  Diagnosis Date  . Hypertension   . Nephrolithiasis   . Cerebral palsy ([place])   . Congenital deafness   . BPH (benign prostatic hyperplasia)   . 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 Anemia   . Blind   . Mood disorder ([place])   . Onychomycosis   . Pneumonia 12/18/2014  . Abdominal wall hernia   . Calculus of gallbladder with acute cholecystitis and obstruction   . Dermatophytosis of nail   . Elevated PSA   . Lower extremity edema     echo with normal left and right heart fucntion, mild MR  . Phimosis     Past Surgical History  Procedure Laterality Date  . Colon surgery    . Diagnostic laparoscopy    . Cholecystectomy    . Colectomy    . Colonoscopy    . Esophagogastroduodenoscopy      Prior to Admission medications   Medication Sig Start Date End Date Taking? Authorizing Provider  Ferrous Sulfate (IRON) 142 (45 FE) MG TBCR Take 45 mg by mouth 2 (two) times a week.   Yes Historical Provider, MD  FINASTERIDE PO Take 5 mg by mouth daily.   Yes Historical Provider, MD  guaiFENesin (ROBITUSSIN) 100 MG/5ML liquid Take 100 mg by mouth every 6 (six) hours as needed for cough.   Yes Historical Provider, MD  Multiple Vitamin (MULTI-VITAMINS) TABS Take 1 tablet by mouth daily.   Yes Historical Provider, MD  oxybutynin (DITROPAN-XL) 5 MG 24 hr tablet Take 5 mg by mouth at bedtime.   Yes Historical Provider, MD  sucralfate (CARAFATE) 1 GM/10ML suspension Take 1 g by mouth 2 (two) times daily before a meal.   Yes Historical Provider, MD  traMADol (ULTRAM) 50 MG tablet Take by mouth every 6 (six) hours as needed for moderate pain.   Yes Historical Provider, MD  ARIPiprazole (ABILIFY) 2 MG tablet Take 1 mg by mouth every morning.     Historical Provider, MD  clonazePAM (KLONOPIN)  0.5 MG tablet Take 0.25 mg by mouth 3 (three) times daily. 06/15/12   Historical Provider, MD  dutasteride (AVODART) 0.5 MG capsule Take 1 capsule by mouth every morning.     Historical Provider, MD  fexofenadine (ALLEGRA) 180 MG tablet Take 180 mg by mouth daily as needed for allergies or rhinitis.    Historical Provider, MD  FLUoxetine (PROZAC) 20 MG capsule Take 20 mg by mouth every morning.     Historical Provider, MD  fluticasone (FLONASE) 50 MCG/ACT nasal spray Place 2 sprays into both nostrils at bedtime. 06/15/12   Historical Provider, MD  furosemide (LASIX) 20 MG tablet Take 20 mg by mouth daily as needed for edema.     Historical Provider, MD  hydrocortisone (ANUSOL-HC) 25 MG suppository Place 25 mg rectally every 12 (twelve) hours as needed (rectal bleeding).     Historical Provider, MD  omeprazole (PRILOSEC) 20 MG capsule Take 20 mg by mouth 2 (two) times daily.    Historical Provider, MD  Phenylephrine-DM-GG (MUCINEX FAST-MAX CONGEST COUGH) 2.5-5-100 MG/5ML LIQD Take 5 mLs by mouth every 6 (six) hours as needed (cough and congestion).    Historical Provider, MD  potassium chloride (K-DUR) 10 MEQ tablet Take 10 mEq by mouth daily as  needed (edema). Take with lasix    Historical Provider, MD  tamsulosin (FLOMAX) 0.4 MG CAPS capsule Take 1 capsule by mouth daily. 06/15/12   Historical Provider, MD    Allergies as of 10/02/2015 - Review Complete 01/19/2015  Allergen Reaction Noted  . Risperidone Hives and Other (See Comments) 12/14/2014  . Levofloxacin Other (See Comments) and Rash 12/14/2014    Family History  Problem Relation Age of Onset  . CAD Father   . Prostate cancer Father   . Colon cancer Father     Social History   Social History  . Marital Status: Single    Spouse Name: N/A  . Number of Children: N/A  . Years of Education: N/A   Occupational History  . Not on file.   Social History Main Topics  . Smoking status: Never Smoker   . Smokeless tobacco: Not on  file  . Alcohol Use: No  . Drug Use: No  . Sexual Activity: Not on file   Other Topics Concern  . Not on file   Social History Narrative    Review of Systems: See HPI, otherwise negative ROS  Physical Exam: BP 109/88 mmHg  Pulse 114  Temp(Src) 96.9 F (36.1 C) (Tympanic)  Resp 20  Ht 5' (1.524 m)  Wt 38.556 kg (85 lb)  BMI 16.60 kg/m2  SpO2 99% General:   Alert,  pleasant and cooperative in NAD Head:  Normocephalic and atraumatic. Neck:  Supple; no masses or thyromegaly. Lungs:  Clear throughout to auscultation.    Heart:  Regular rate and rhythm. Abdomen:  Soft, nontender and nondistended. Normal bowel sounds, without guarding, and without rebound.   Neurologic:  Alert and  oriented x4;  grossly normal neurologically.  Impression/Plan: Shawn Sweeney is here for an EGD/colonoscpy to be performed for dysphagia and personal hx of colon cancer. Risks, benefits, limitations, and alternatives regarding EGD/colonoscopy} have been reviewed with the patient.  Questions have been answered.  All parties agreeable.   Clint Biello, Lupita Dawn, MD  10/08/2015, 9:32 AM

## 2015-10-08 NOTE — Op Note (Signed)
[facility] Gastroenterology Patient Name: Shawn Sweeney Procedure Date: 10/08/2015 9:31 AM MRN: [id number] Account #: [id number] Date of Birth: 08-29-1955 Admit Type: Outpatient Age: 60 Room: [place] ENDO ROOM 4 Gender: Male Note Status: Finalized Procedure:            Upper GI endoscopy Indications:          Dysphagia Providers:            Lupita Dawn. Candace Cruise, MD Referring MD:         Ramonita Lab, MD (Referring MD) Medicines:            Monitored Anesthesia Care Complications:        No immediate complications. Procedure:            Pre-Anesthesia Assessment:                       - Prior to the procedure, a History and Physical was                        performed, and patient medications, allergies and                        sensitivities were reviewed. The patient's tolerance of                        previous anesthesia was reviewed.                       - The risks and benefits of the procedure and the                        sedation options and risks were discussed with the                        patient. All questions were answered and informed                        consent was obtained.                       - After reviewing the risks and benefits, the patient                        was deemed in satisfactory condition to undergo the                        procedure.                       After obtaining informed consent, the endoscope was                        passed under direct vision. Throughout the procedure,                        the patient's blood pressure, pulse, and oxygen                        saturations were monitored continuously. The Endoscope                        was  introduced through the mouth, and advanced to the                        second part of duodenum. The upper GI endoscopy was                        accomplished without difficulty. The patient tolerated                        the procedure well. Findings:      There were  esophageal mucosal changes consistent with long-segment       Barrett's esophagus present in the lower third of the esophagus. The       maximum longitudinal extent of these mucosal changes was 5 cm in length.       Mucosa was biopsied with a cold forceps for histology in 4 quadrants in       the lower third of the esophagus. One specimen bottle was sent to       pathology. The scope was withdrawn. Dilation was performed with a       Maloney dilator with moderate resistance at 54 Fr.      The exam was otherwise without abnormality.      The entire examined stomach was normal.      The examined duodenum was normal. Impression:           - Esophageal mucosal changes consistent with                        long-segment Barrett's esophagus. Biopsied. Dilated.                       - The examination was otherwise normal.                       - Normal stomach.                       - Normal examined duodenum. Recommendation:       - Discharge patient to home.                       - Observe patient's clinical course.                       - The findings and recommendations were discussed with                        the patient. Procedure Code(s):    --- Professional ---                       [phone], Esophagogastroduodenoscopy, flexible, transoral;                        with biopsy, single or multiple                       43450, Dilation of esophagus, by unguided sound or                        bougie, single or multiple passes Diagnosis Code(s):    --- Professional ---  K22.8, Other specified diseases of esophagus                       R13.10, Dysphagia, unspecified CPT copyright 2016 American Medical Association. All rights reserved. The codes documented in this report are preliminary and upon coder review may  be revised to meet current compliance requirements. Hulen Luster, MD 10/08/2015 9:45:16 AM This report has been signed electronically. Number of Addenda: 0 Note  Initiated On: 10/08/2015 9:31 AM      [facility]

## 2015-10-08 NOTE — Anesthesia Preprocedure Evaluation (Addendum)
Anesthesia Evaluation  Patient identified by MRN, date of birth, ID bandGeneral Assessment Comment:CP, deaf and blind--care givers answer all questions.  Airway Mallampati: III   Neck ROM: Limited  Mouth opening: Limited Mouth Opening  Dental  (+) Edentulous Upper, Edentulous Lower   Pulmonary    Pulmonary exam normal        Cardiovascular Exercise Tolerance: Poor hypertension, Normal cardiovascular exam     Neuro/Psych    GI/Hepatic   Endo/Other    Renal/GU Hx of stones, hydronephrosis.     Musculoskeletal   Abdominal   Peds  Hematology   Anesthesia Other Findings   Reproductive/Obstetrics                            Anesthesia Physical Anesthesia Plan  ASA: IV  Anesthesia Plan: General   Post-op Pain Management:    Induction: Intravenous  Airway Management Planned: Nasal Cannula  Additional Equipment:   Intra-op Plan:   Post-operative Plan:   Informed Consent: I have reviewed the patients History and Physical, chart, labs and discussed the procedure including the risks, benefits and alternatives for the proposed anesthesia with the patient or authorized representative who has indicated his/her understanding and acceptance.     Plan Discussed with: CRNA  Anesthesia Plan Comments:         Anesthesia Quick Evaluation

## 2015-10-09 ENCOUNTER — Encounter: Payer: Self-pay | Admitting: Gastroenterology

## 2015-10-09 LAB — SURGICAL PATHOLOGY

## 2015-12-06 DIAGNOSIS — R634 Abnormal weight loss: Secondary | ICD-10-CM | POA: Insufficient documentation

## 2015-12-07 ENCOUNTER — Other Ambulatory Visit: Payer: Self-pay | Admitting: Internal Medicine

## 2015-12-07 DIAGNOSIS — R634 Abnormal weight loss: Secondary | ICD-10-CM

## 2015-12-07 DIAGNOSIS — K439 Ventral hernia without obstruction or gangrene: Secondary | ICD-10-CM

## 2015-12-21 ENCOUNTER — Ambulatory Visit
Admission: RE | Admit: 2015-12-21 | Discharge: 2015-12-21 | Disposition: A | Discharge status: Home / Self Care | Payer: Medicare Other | Source: Ambulatory Visit | Attending: Internal Medicine | Admitting: Internal Medicine

## 2015-12-21 DIAGNOSIS — K449 Diaphragmatic hernia without obstruction or gangrene: Secondary | ICD-10-CM | POA: Diagnosis not present

## 2015-12-21 DIAGNOSIS — R634 Abnormal weight loss: Secondary | ICD-10-CM | POA: Diagnosis present

## 2015-12-21 DIAGNOSIS — K439 Ventral hernia without obstruction or gangrene: Secondary | ICD-10-CM | POA: Insufficient documentation

## 2015-12-21 DIAGNOSIS — N1330 Unspecified hydronephrosis: Secondary | ICD-10-CM | POA: Diagnosis not present

## 2015-12-21 MED ORDER — IOPAMIDOL (ISOVUE-300) INJECTION 61%: 75.0000 mL | Freq: Once | INTRAVENOUS | Status: DC | PRN

## 2015-12-21 MED ORDER — IOPAMIDOL (ISOVUE-300) INJECTION 61%
60.0000 mL | Freq: Once | INTRAVENOUS | Status: AC | PRN
  Administered 2015-12-21: 60 mL via INTRAVENOUS

## 2016-01-14 DIAGNOSIS — K4020 Bilateral inguinal hernia, without obstruction or gangrene, not specified as recurrent: Secondary | ICD-10-CM | POA: Insufficient documentation

## 2016-02-09 ENCOUNTER — Emergency Department
Admission: EM | Admit: 2016-02-09 | Discharge: 2016-02-09 | Disposition: A | Discharge status: Home / Self Care | Payer: Medicare Other | Attending: Emergency Medicine | Admitting: Emergency Medicine

## 2016-02-09 ENCOUNTER — Emergency Department: Payer: Medicare Other

## 2016-02-09 ENCOUNTER — Encounter: Payer: Self-pay | Admitting: Emergency Medicine

## 2016-02-09 DIAGNOSIS — Z7951 Long term (current) use of inhaled steroids: Secondary | ICD-10-CM | POA: Diagnosis not present

## 2016-02-09 DIAGNOSIS — Z79899 Other long term (current) drug therapy: Secondary | ICD-10-CM | POA: Insufficient documentation

## 2016-02-09 DIAGNOSIS — I10 Essential (primary) hypertension: Secondary | ICD-10-CM | POA: Insufficient documentation

## 2016-02-09 DIAGNOSIS — T17320A Food in larynx causing asphyxiation, initial encounter: Secondary | ICD-10-CM

## 2016-02-09 DIAGNOSIS — R0989 Other specified symptoms and signs involving the circulatory and respiratory systems: Secondary | ICD-10-CM | POA: Diagnosis not present

## 2016-02-09 NOTE — ED Triage Notes (Signed)
Approx noon today resident of group home noted to be choking, Heimlich maneuver done by caregiver and food expelled, no distress after. Arrives with caregiver with no resp distress, at baseline behavior per caregiver.

## 2016-02-09 NOTE — ED Notes (Signed)
NAD noted at time of D/C. Pt's caregiver denies questions or concerns. Pt taken tot he lobby via wheelchair by his caregiver.

## 2016-02-09 NOTE — ED Provider Notes (Signed)
[facility] Emergency Department Provider Note  ____________________________________________  Time seen: Approximately 2:12 PM  I have reviewed the triage vital signs and the nursing notes.   HISTORY  Chief Complaint Choking    HPI Shawn Sweeney is a 60 y.o. male with a history of cerebral palsy and altered mental status within a group home presents for evaluation of choking. Caregiver states that the patient choked and was given the Heimlich maneuver prior to arrival. Voices no other complaints at this time.   Past Medical History:  Diagnosis Date  . Abdominal wall hernia   . 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 Anemia   . Blind   . BPH (benign prostatic hyperplasia)   . Calculus of gallbladder with acute cholecystitis and obstruction   . Cerebral palsy ([place])   . Congenital deafness   . Dermatophytosis of nail   . Elevated PSA   . Hypertension   . Lower extremity edema    echo with normal left and right heart fucntion, mild MR  . Mood disorder ([place])   . Nephrolithiasis   . Onychomycosis   . Phimosis   . Pneumonia 12/18/2014    Patient Active Problem List   Diagnosis Date Noted  . Pneumonia 12/18/2014  . Hypotension 12/15/2014  . Bradycardia 12/15/2014  . Cerebral palsy with spastic/ataxic diplegia 12/15/2014  . 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 Blindness 12/15/2014  . Altered mental status 12/14/2014  . Hydronephrosis, left 12/14/2014  . Nephrolithiasis 12/14/2014    Class: Chronic    Past Surgical History:  Procedure Laterality Date  . CHOLECYSTECTOMY    . COLECTOMY    . COLON SURGERY    . COLONOSCOPY    . COLONOSCOPY WITH PROPOFOL N/A 10/08/2015   Procedure: COLONOSCOPY WITH PROPOFOL;  Surgeon: Hulen Luster, MD;  Location: [facility] ENDOSCOPY;  Service: Gastroenterology;  Laterality: N/A;  . DIAGNOSTIC LAPAROSCOPY    . ESOPHAGOGASTRODUODENOSCOPY    . ESOPHAGOGASTRODUODENOSCOPY (EGD) WITH PROPOFOL N/A 10/08/2015   Procedure:  ESOPHAGOGASTRODUODENOSCOPY (EGD) WITH PROPOFOL;  Surgeon: Hulen Luster, MD;  Location: [facility] ENDOSCOPY;  Service: Gastroenterology;  Laterality: N/A;    Prior to Admission medications   Medication Sig Start Date End Date Taking? Authorizing Provider  ARIPiprazole (ABILIFY) 2 MG tablet Take 1 mg by mouth every morning.     Historical Provider, MD  clonazePAM (KLONOPIN) 0.5 MG tablet Take 0.25 mg by mouth 3 (three) times daily. 06/15/12   Historical Provider, MD  dutasteride (AVODART) 0.5 MG capsule Take 1 capsule by mouth every morning.     Historical Provider, MD  Ferrous Sulfate (IRON) 142 (45 FE) MG TBCR Take 45 mg by mouth 2 (two) times a week.    Historical Provider, MD  fexofenadine (ALLEGRA) 180 MG tablet Take 180 mg by mouth daily as needed for allergies or rhinitis.    Historical Provider, MD  FINASTERIDE PO Take 5 mg by mouth daily.    Historical Provider, MD  FLUoxetine (PROZAC) 20 MG capsule Take 20 mg by mouth every morning.     Historical Provider, MD  fluticasone (FLONASE) 50 MCG/ACT nasal spray Place 2 sprays into both nostrils at bedtime. 06/15/12   Historical Provider, MD  furosemide (LASIX) 20 MG tablet Take 20 mg by mouth daily as needed for edema.     Historical Provider, MD  guaiFENesin (ROBITUSSIN) 100 MG/5ML liquid Take 100 mg by mouth every 6 (six) hours as needed for cough.    Historical Provider, MD  hydrocortisone (ANUSOL-HC)  25 MG suppository Place 25 mg rectally every 12 (twelve) hours as needed (rectal bleeding).     Historical Provider, MD  Multiple Vitamin (MULTI-VITAMINS) TABS Take 1 tablet by mouth daily.    Historical Provider, MD  omeprazole (PRILOSEC) 20 MG capsule Take 20 mg by mouth 2 (two) times daily.    Historical Provider, MD  oxybutynin (DITROPAN-XL) 5 MG 24 hr tablet Take 5 mg by mouth at bedtime.    Historical Provider, MD  Phenylephrine-DM-GG (MUCINEX FAST-MAX CONGEST COUGH) 2.5-5-100 MG/5ML LIQD Take 5 mLs by mouth every 6 (six) hours as needed (cough  and congestion).    Historical Provider, MD  potassium chloride (K-DUR) 10 MEQ tablet Take 10 mEq by mouth daily as needed (edema). Take with lasix    Historical Provider, MD  sucralfate (CARAFATE) 1 GM/10ML suspension Take 1 g by mouth 2 (two) times daily before a meal.    Historical Provider, MD  tamsulosin (FLOMAX) 0.4 MG CAPS capsule Take 1 capsule by mouth daily. 06/15/12   Historical Provider, MD  traMADol (ULTRAM) 50 MG tablet Take by mouth every 6 (six) hours as needed for moderate pain.    Historical Provider, MD    Allergies Risperidone and Levofloxacin  Family History  Problem Relation Age of Onset  . CAD Father   . Prostate cancer Father   . Colon cancer Father     Social History Social History  Substance Use Topics  . Smoking status: Never Smoker  . Smokeless tobacco: Not on file  . Alcohol use No    Review of Systems Constitutional: No fever/chills ENT: No sore throat. Cardiovascular: Denies chest pain. Respiratory: Denies shortness of breath. Gastrointestinal: No abdominal pain.  No nausea, no vomiting.  No diarrhea.  No constipation. Genitourinary: Negative for dysuria. Musculoskeletal: Negative for back pain. Skin: Negative for rash. Neurological: Negative for headaches, focal weakness or numbness.  10-point ROS otherwise negative.  ____________________________________________   PHYSICAL EXAM:  VITAL SIGNS: ED Triage Vitals   Enc Vitals Group     BP 129/87     Pulse Rate 76     Resp 18     Temp 97.6 F (36.4 C)     Temp Source Axillary     SpO2 100 %     Weight      Height      Head Circumference      Peak Flow      Pain Score      Pain Loc      Pain Edu?      Excl. in [place]?     Constitutional: Well appearing and in no acute distress. Head: Atraumatic. Nose: No congestion/rhinnorhea. Mouth/Throat: Mucous membranes are moist.  Oropharynx non-erythematous. Neck: No stridor.   Cardiovascular: Normal rate, regular rhythm. Grossly normal  heart sounds.  Good peripheral circulation. Respiratory: Normal respiratory effort.  No retractions. Lungs CTAB. Gastrointestinal: Soft and nontender. No distention. No abdominal bruits. No CVA tenderness. Skin:  Skin is warm, dry and intact. No rash noted. Psychiatric: Mood and affect are normal. Speech and behavior are normal.  ____________________________________________   LABS (all labs ordered are listed, but only abnormal results are displayed)  Labs Reviewed - No data to display ____________________________________________  EKG   ____________________________________________  RADIOLOGY  Chest x-ray unremarkable ____________________________________________   PROCEDURES  Procedure(s) performed: None  Critical Care performed: No  ____________________________________________   INITIAL IMPRESSION / ASSESSMENT AND PLAN / ED COURSE  Pertinent labs & imaging results that were available during  my care of the patient were reviewed by me and considered in my medical decision making (see chart for details). Review of the  [place]RS was performed in accordance of the [place] prior to dispensing any controlled drugs.  Recent history of choking. Patient appears to be stable at this time discharged back to the group home under the care of his caregivers.  Clinical Course    ____________________________________________   FINAL CLINICAL IMPRESSION(S) / ED DIAGNOSES  Final diagnoses:  Choking due to food (regurgitated), initial encounter     This chart was dictated using voice recognition software/Dragon. Despite best efforts to proofread, errors can occur which can change the meaning. Any change was purely unintentional.    Arlyss Repress, PA-C 02/09/16 [place], MD 02/09/16 1517

## 2016-02-09 NOTE — ED Triage Notes (Signed)
Patient appears in NAD. Chest x ray ordered for clearance before determining if is flex care candidate.

## 2016-02-09 NOTE — ED Notes (Signed)
Chest ray as noted, patient to flex wait.

## 2016-02-13 DIAGNOSIS — T17308A Unspecified foreign body in larynx causing other injury, initial encounter: Secondary | ICD-10-CM | POA: Insufficient documentation

## 2016-04-11 DIAGNOSIS — I700 Atherosclerosis of aorta: Secondary | ICD-10-CM

## 2016-04-11 HISTORY — DX: Atherosclerosis of aorta: I70.0

## 2017-01-16 ENCOUNTER — Encounter: Payer: Self-pay | Admitting: Emergency Medicine

## 2017-01-16 ENCOUNTER — Inpatient Hospital Stay: Payer: Medicare Other

## 2017-01-16 ENCOUNTER — Inpatient Hospital Stay
Admission: EM | Admit: 2017-01-16 | Discharge: 2017-01-18 | DRG: 689 | Disposition: A | Discharge status: Home / Self Care | Payer: Medicare Other | Attending: Specialist | Admitting: Specialist

## 2017-01-16 ENCOUNTER — Emergency Department: Payer: Medicare Other

## 2017-01-16 DIAGNOSIS — Z993 Dependence on wheelchair: Secondary | ICD-10-CM

## 2017-01-16 DIAGNOSIS — R0902 Hypoxemia: Secondary | ICD-10-CM

## 2017-01-16 DIAGNOSIS — R55 Syncope and collapse: Secondary | ICD-10-CM | POA: Diagnosis present

## 2017-01-16 DIAGNOSIS — N390 Urinary tract infection, site not specified: Principal | ICD-10-CM | POA: Diagnosis present

## 2017-01-16 DIAGNOSIS — K219 Gastro-esophageal reflux disease without esophagitis: Secondary | ICD-10-CM | POA: Diagnosis present

## 2017-01-16 DIAGNOSIS — F419 Anxiety disorder, unspecified: Secondary | ICD-10-CM | POA: Diagnosis present

## 2017-01-16 DIAGNOSIS — Z881 Allergy status to other antibiotic agents status: Secondary | ICD-10-CM

## 2017-01-16 DIAGNOSIS — N400 Enlarged prostate without lower urinary tract symptoms: Secondary | ICD-10-CM | POA: Diagnosis present

## 2017-01-16 DIAGNOSIS — H548 Legal blindness, as defined in USA: Secondary | ICD-10-CM | POA: Diagnosis present

## 2017-01-16 DIAGNOSIS — F329 Major depressive disorder, single episode, unspecified: Secondary | ICD-10-CM | POA: Diagnosis present

## 2017-01-16 DIAGNOSIS — H905 Unspecified sensorineural hearing loss: Secondary | ICD-10-CM | POA: Diagnosis present

## 2017-01-16 DIAGNOSIS — Z85038 Personal history of other malignant neoplasm of large intestine: Secondary | ICD-10-CM

## 2017-01-16 DIAGNOSIS — G809 Cerebral palsy, unspecified: Secondary | ICD-10-CM | POA: Diagnosis present

## 2017-01-16 DIAGNOSIS — R001 Bradycardia, unspecified: Secondary | ICD-10-CM | POA: Diagnosis present

## 2017-01-16 DIAGNOSIS — R748 Abnormal levels of other serum enzymes: Secondary | ICD-10-CM

## 2017-01-16 DIAGNOSIS — Z7401 Bed confinement status: Secondary | ICD-10-CM | POA: Diagnosis not present

## 2017-01-16 DIAGNOSIS — J189 Pneumonia, unspecified organism: Secondary | ICD-10-CM | POA: Diagnosis present

## 2017-01-16 DIAGNOSIS — I10 Essential (primary) hypertension: Secondary | ICD-10-CM | POA: Diagnosis present

## 2017-01-16 DIAGNOSIS — Z79899 Other long term (current) drug therapy: Secondary | ICD-10-CM | POA: Diagnosis not present

## 2017-01-16 DIAGNOSIS — Z888 Allergy status to other drugs, medicaments and biological substances status: Secondary | ICD-10-CM | POA: Diagnosis not present

## 2017-01-16 DIAGNOSIS — N200 Calculus of kidney: Secondary | ICD-10-CM

## 2017-01-16 HISTORY — DX: Gastro-esophageal reflux disease without esophagitis: K21.9

## 2017-01-16 LAB — CBC WITH DIFFERENTIAL/PLATELET
BASOS ABS: 0.1 10*3/uL (ref 0–0.1)
BASOS PCT: 1 %
EOS ABS: 0.3 10*3/uL (ref 0–0.7)
EOS PCT: 5 %
HCT: 38.7 % — ABNORMAL LOW (ref 40.0–52.0)
HEMOGLOBIN: 13.3 g/dL (ref 13.0–18.0)
Lymphocytes Relative: 21 %
Lymphs Abs: 1.3 10*3/uL (ref 1.0–3.6)
MCH: 32.4 pg (ref 26.0–34.0)
MCHC: 34.4 g/dL (ref 32.0–36.0)
MCV: 94.3 fL (ref 80.0–100.0)
Monocytes Absolute: 0.7 10*3/uL (ref 0.2–1.0)
Monocytes Relative: 11 %
NEUTROS PCT: 62 %
Neutro Abs: 4 10*3/uL (ref 1.4–6.5)
PLATELETS: 115 10*3/uL — AB (ref 150–440)
RBC: 4.11 MIL/uL — AB (ref 4.40–5.90)
RDW: 12.4 % (ref 11.5–14.5)
WBC: 6.4 10*3/uL (ref 3.8–10.6)

## 2017-01-16 LAB — BRAIN NATRIURETIC PEPTIDE: B NATRIURETIC PEPTIDE 5: 56 pg/mL (ref 0.0–100.0)

## 2017-01-16 LAB — COMPREHENSIVE METABOLIC PANEL
ALT: 17 U/L (ref 17–63)
AST: 23 U/L (ref 15–41)
Albumin: 3.4 g/dL — ABNORMAL LOW (ref 3.5–5.0)
Alkaline Phosphatase: 92 U/L (ref 38–126)
Anion gap: 2 — ABNORMAL LOW (ref 5–15)
BILIRUBIN TOTAL: 0.4 mg/dL (ref 0.3–1.2)
BUN: 20 mg/dL (ref 6–20)
CHLORIDE: 109 mmol/L (ref 101–111)
CO2: 27 mmol/L (ref 22–32)
CREATININE: 1 mg/dL (ref 0.61–1.24)
Calcium: 8.1 mg/dL — ABNORMAL LOW (ref 8.9–10.3)
Glucose, Bld: 103 mg/dL — ABNORMAL HIGH (ref 65–99)
Potassium: 3.7 mmol/L (ref 3.5–5.1)
Sodium: 138 mmol/L (ref 135–145)
TOTAL PROTEIN: 7.1 g/dL (ref 6.5–8.1)

## 2017-01-16 LAB — URINALYSIS, COMPLETE (UACMP) WITH MICROSCOPIC
BILIRUBIN URINE: NEGATIVE
GLUCOSE, UA: NEGATIVE mg/dL
HGB URINE DIPSTICK: NEGATIVE
KETONES UR: NEGATIVE mg/dL
NITRITE: NEGATIVE
PH: 5 (ref 5.0–8.0)
Protein, ur: NEGATIVE mg/dL
SPECIFIC GRAVITY, URINE: 1.019 (ref 1.005–1.030)
SQUAMOUS EPITHELIAL / LPF: NONE SEEN

## 2017-01-16 LAB — CK: Total CK: 580 U/L — ABNORMAL HIGH (ref 49–397)

## 2017-01-16 LAB — TROPONIN I

## 2017-01-16 MED ORDER — PANTOPRAZOLE SODIUM 20 MG PO TBEC
20.0000 mg | DELAYED_RELEASE_TABLET | Freq: Two times a day (BID) | ORAL | Status: DC
  Filled 2017-01-16: qty 1

## 2017-01-16 MED ORDER — ONDANSETRON HCL 4 MG/2ML IJ SOLN: 4.0000 mg | Freq: Four times a day (QID) | INTRAMUSCULAR | Status: DC | PRN

## 2017-01-16 MED ORDER — FINASTERIDE 5 MG PO TABS
5.0000 mg | ORAL_TABLET | Freq: Every day | ORAL | Status: DC
  Administered 2017-01-17 – 2017-01-18 (×2): 5 mg via ORAL
  Filled 2017-01-16 (×2): qty 1

## 2017-01-16 MED ORDER — TRAMADOL HCL 50 MG PO TABS: 50.0000 mg | ORAL_TABLET | Freq: Four times a day (QID) | ORAL | Status: DC | PRN

## 2017-01-16 MED ORDER — GUAIFENESIN 100 MG/5ML PO SOLN
100.0000 mg | Freq: Four times a day (QID) | ORAL | Status: DC | PRN
  Filled 2017-01-16: qty 5

## 2017-01-16 MED ORDER — SODIUM CHLORIDE 0.9 % IV BOLUS (SEPSIS)
500.0000 mL | Freq: Once | INTRAVENOUS | Status: AC
  Administered 2017-01-16: 500 mL via INTRAVENOUS

## 2017-01-16 MED ORDER — PANTOPRAZOLE SODIUM 40 MG PO PACK
20.0000 mg | PACK | Freq: Two times a day (BID) | ORAL | Status: DC
  Administered 2017-01-16 – 2017-01-18 (×4): 20 mg via ORAL
  Filled 2017-01-16 (×7): qty 20

## 2017-01-16 MED ORDER — ONDANSETRON HCL 4 MG PO TABS: 4.0000 mg | ORAL_TABLET | Freq: Four times a day (QID) | ORAL | Status: DC | PRN

## 2017-01-16 MED ORDER — DEXTROSE 5 % IV SOLN
1.0000 g | Freq: Once | INTRAVENOUS | Status: AC
  Administered 2017-01-16: 1 g via INTRAVENOUS
  Filled 2017-01-16: qty 10

## 2017-01-16 MED ORDER — FLUOXETINE HCL 20 MG PO CAPS
20.0000 mg | ORAL_CAPSULE | ORAL | Status: DC
  Administered 2017-01-17 – 2017-01-18 (×2): 20 mg via ORAL
  Filled 2017-01-16 (×2): qty 1

## 2017-01-16 MED ORDER — ACETAMINOPHEN 325 MG PO TABS: 650.0000 mg | ORAL_TABLET | Freq: Four times a day (QID) | ORAL | Status: DC | PRN

## 2017-01-16 MED ORDER — ENOXAPARIN SODIUM 40 MG/0.4ML ~~LOC~~ SOLN
40.0000 mg | SUBCUTANEOUS | Status: DC
  Administered 2017-01-16: 40 mg via SUBCUTANEOUS
  Filled 2017-01-16: qty 0.4

## 2017-01-16 MED ORDER — CLONAZEPAM 0.5 MG PO TABS
0.2500 mg | ORAL_TABLET | Freq: Three times a day (TID) | ORAL | Status: DC
  Administered 2017-01-16 – 2017-01-18 (×5): 0.25 mg via ORAL
  Filled 2017-01-16 (×5): qty 1

## 2017-01-16 MED ORDER — ARIPIPRAZOLE 2 MG PO TABS
1.0000 mg | ORAL_TABLET | ORAL | Status: DC
  Administered 2017-01-17 – 2017-01-18 (×2): 1 mg via ORAL
  Filled 2017-01-16 (×2): qty 1

## 2017-01-16 MED ORDER — SUCRALFATE 1 GM/10ML PO SUSP
1.0000 g | Freq: Two times a day (BID) | ORAL | Status: DC
  Administered 2017-01-17 – 2017-01-18 (×2): 1 g via ORAL
  Filled 2017-01-16 (×4): qty 10

## 2017-01-16 MED ORDER — DEXTROSE 5 % IV SOLN
1.0000 g | INTRAVENOUS | Status: DC
  Administered 2017-01-16 – 2017-01-17 (×2): 1 g via INTRAVENOUS
  Filled 2017-01-16 (×3): qty 10

## 2017-01-16 MED ORDER — DUTASTERIDE 0.5 MG PO CAPS
0.5000 mg | ORAL_CAPSULE | ORAL | Status: DC
  Administered 2017-01-17 – 2017-01-18 (×2): 0.5 mg via ORAL
  Filled 2017-01-16 (×2): qty 1

## 2017-01-16 MED ORDER — TAMSULOSIN HCL 0.4 MG PO CAPS
0.4000 mg | ORAL_CAPSULE | Freq: Every day | ORAL | Status: DC
  Administered 2017-01-17 – 2017-01-18 (×2): 0.4 mg via ORAL
  Filled 2017-01-16 (×2): qty 1

## 2017-01-16 MED ORDER — ACETAMINOPHEN 650 MG RE SUPP: 650.0000 mg | Freq: Four times a day (QID) | RECTAL | Status: DC | PRN

## 2017-01-16 NOTE — H&P (Signed)
[facility] [place] NAME: Shawn Sweeney    MR#:  [id number]  DATE OF BIRTH:  1956-03-09  DATE OF ADMISSION:  01/16/2017  PRIMARY CARE PHYSICIAN: Adin Hector, MD   REQUESTING/REFERRING PHYSICIAN: Burlene Arnt, MD  CHIEF COMPLAINT:   Chief Complaint  Patient presents with  . Near Syncope    Pt. here via EMS    HISTORY OF PRESENT ILLNESS:  Shawn Sweeney  is a 61 y.o. male who presents with Abnormal appearance. Patient has a history of congenital blindness, deafness, cerebral palsy. His caretaker said that he started appearing unwell today. Patient is unable to contribute to his own history, and all information is obtained from family members at bedside. They brought him to the ED for evaluation. EMS that he had an episode of oxygen desaturation down to about 84% in route to the hospital. He had another episode similar to this here in the hospital.  Chest x-ray did not show any sort of pneumonia, although he was found to have a UTI. There is significant concern for possible aspiration event which may be too early to see on imaging. Family states that he has been having potentially some more difficulty with his swallowing recently. Hospitals were called for admission  PAST MEDICAL HISTORY:   Past Medical History:  Diagnosis Date  . Abdominal wall hernia   . 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 Anemia   . Blind   . BPH (benign prostatic hyperplasia)   . Calculus of gallbladder with acute cholecystitis and obstruction   . Cerebral palsy ([place])   . Congenital deafness   . Dermatophytosis of nail   . Elevated PSA   . GERD (gastroesophageal reflux disease) 01/16/2017  . Hypertension   . Lower extremity edema    echo with normal left and right heart fucntion, mild MR  . Mood disorder ([place])   . Nephrolithiasis   . Onychomycosis   . Phimosis   . Pneumonia 12/18/2014    PAST SURGICAL HISTORY:   Past Surgical History:  Procedure  Laterality Date  . CHOLECYSTECTOMY    . COLECTOMY    . COLON SURGERY    . COLONOSCOPY    . COLONOSCOPY WITH PROPOFOL N/A 10/08/2015   Procedure: COLONOSCOPY WITH PROPOFOL;  Surgeon: Hulen Luster, MD;  Location: [facility] ENDOSCOPY;  Service: Gastroenterology;  Laterality: N/A;  . DIAGNOSTIC LAPAROSCOPY    . ESOPHAGOGASTRODUODENOSCOPY    . ESOPHAGOGASTRODUODENOSCOPY (EGD) WITH PROPOFOL N/A 10/08/2015   Procedure: ESOPHAGOGASTRODUODENOSCOPY (EGD) WITH PROPOFOL;  Surgeon: Hulen Luster, MD;  Location: [facility] ENDOSCOPY;  Service: Gastroenterology;  Laterality: N/A;    SOCIAL HISTORY:   Social History  Substance Use Topics  . Smoking status: Never Smoker  . Smokeless tobacco: Never Used  . Alcohol use No    FAMILY HISTORY:   Family History  Problem Relation Age of Onset  . CAD Father   . Prostate cancer Father   . Colon cancer Father     DRUG ALLERGIES:   Allergies  Allergen Reactions  . Risperidone Hives and Other (See Comments)  . Levofloxacin Other (See Comments) and Rash    MEDICATIONS AT HOME:   Prior to Admission medications   Medication Sig Start Date End Date Taking? Authorizing Provider  ARIPiprazole (ABILIFY) 2 MG tablet Take 1 mg by mouth every morning.     [provider]  clonazePAM (KLONOPIN) 0.5 MG tablet Take 0.25 mg by mouth 3 (three) times daily.  06/15/12   [provider]  dutasteride (AVODART) 0.5 MG capsule Take 1 capsule by mouth every morning.     [provider]  Ferrous Sulfate (IRON) 142 (45 FE) MG TBCR Take 45 mg by mouth 2 (two) times a week.    [provider]  fexofenadine (ALLEGRA) 180 MG tablet Take 180 mg by mouth daily as needed for allergies or rhinitis.    [provider]  FINASTERIDE PO Take 5 mg by mouth daily.    [provider]  FLUoxetine (PROZAC) 20 MG capsule Take 20 mg by mouth every morning.     [provider]  fluticasone (FLONASE) 50 MCG/ACT nasal spray Place 2 sprays into both  nostrils at bedtime. 06/15/12   [provider]  furosemide (LASIX) 20 MG tablet Take 20 mg by mouth daily as needed for edema.     [provider]  guaiFENesin (ROBITUSSIN) 100 MG/5ML liquid Take 100 mg by mouth every 6 (six) hours as needed for cough.    [provider]  hydrocortisone (ANUSOL-HC) 25 MG suppository Place 25 mg rectally every 12 (twelve) hours as needed (rectal bleeding).     [provider]  Multiple Vitamin (MULTI-VITAMINS) TABS Take 1 tablet by mouth daily.    [provider]  omeprazole (PRILOSEC) 20 MG capsule Take 20 mg by mouth 2 (two) times daily.    [provider]  oxybutynin (DITROPAN-XL) 5 MG 24 hr tablet Take 5 mg by mouth at bedtime.    [provider]  Phenylephrine-DM-GG (MUCINEX FAST-MAX CONGEST COUGH) 2.5-5-100 MG/5ML LIQD Take 5 mLs by mouth every 6 (six) hours as needed (cough and congestion).    [provider]  potassium chloride (K-DUR) 10 MEQ tablet Take 10 mEq by mouth daily as needed (edema). Take with lasix    [provider]  sucralfate (CARAFATE) 1 GM/10ML suspension Take 1 g by mouth 2 (two) times daily before a meal.    [provider]  tamsulosin (FLOMAX) 0.4 MG CAPS capsule Take 1 capsule by mouth daily. 06/15/12   [provider]  traMADol (ULTRAM) 50 MG tablet Take by mouth every 6 (six) hours as needed for moderate pain.    [provider]    REVIEW OF SYSTEMS:  Review of Systems  Unable to perform ROS: Medical condition     VITAL SIGNS:   Vitals:   01/16/17 1504 01/16/17 1508 01/16/17 1509 01/16/17 1530  Pulse:  (!) 52    Resp:  15    Temp:  97.7 F (36.5 C)    TempSrc:  Axillary    SpO2: (!) 84% 100%  100%  Weight:   40.8 kg (90 lb)   Height:   5' (1.524 m)    Wt Readings from Last 3 Encounters:  01/16/17 40.8 kg (90 lb)  10/08/15 38.6 kg (85 lb)  12/22/14 38.6 kg (85 lb)    PHYSICAL EXAMINATION:  Physical Exam   Vitals reviewed. Constitutional:  very small stature  HENT:  Head: Normocephalic and atraumatic.  Mouth/Throat: Oropharynx is clear and moist.  Eyes: Pupils are equal, round, and reactive to light. Conjunctivae and EOM are normal. No scleral icterus.  Neck: No JVD present. No thyromegaly present.  Cardiovascular: Regular rhythm and intact distal pulses.  Exam reveals no gallop and no friction rub.   No murmur heard. Bradycardic  Respiratory: Effort normal and breath sounds normal. No respiratory distress. He has no wheezes. He has no rales.  GI:  Soft. Bowel sounds are normal. He exhibits no distension. There is no tenderness.  Musculoskeletal: He exhibits no edema.  Some chronic contracture  Lymphadenopathy:    He has no cervical adenopathy.  Neurological:  Unable to assess due to patient condition  Skin: Skin is warm and dry. No rash noted. No erythema.  Psychiatric: Thought content normal.  Unable to assess due to patient condition    LABORATORY PANEL:   CBC  Recent Labs Lab 01/16/17 1528  WBC 6.4  HGB 13.3  HCT 38.7*  PLT 115*   ------------------------------------------------------------------------------------------------------------------  Chemistries   Recent Labs Lab 01/16/17 1528  NA 138  K 3.7  CL 109  CO2 27  GLUCOSE 103*  BUN 20  CREATININE 1.00  CALCIUM 8.1*  AST 23  ALT 17  ALKPHOS 92  BILITOT 0.4   ------------------------------------------------------------------------------------------------------------------  Cardiac Enzymes  Recent Labs Lab 01/16/17 1528  TROPONINI <0.03   ------------------------------------------------------------------------------------------------------------------  RADIOLOGY:  Dg Chest Port 1 View  Result Date: 01/16/2017 CLINICAL DATA:  Decreased responsiveness, near syncopal episode, history hypertension, cerebral palsy EXAM: PORTABLE CHEST 1 VIEW COMPARISON:  Portable exam 1535 hours compared  02/09/2016 FINDINGS: Normal heart size, mediastinal contours, and pulmonary vascularity. Lungs clear. No pleural effusion or pneumothorax. Bones demineralized. IMPRESSION: No acute abnormalities. Electronically Signed   By: Lavonia Dana M.D.   On: 01/16/2017 16:02    EKG:   Orders placed or performed during the hospital encounter of 01/16/17  . EKG 12-Lead  . EKG 12-Lead    IMPRESSION AND PLAN:  Principal Problem:   UTI (urinary tract infection) - IV antibiotics given in the ED, we will switch this to meropenem on admission given the chart review. The patient at some point in the past has had at least partially resistant bacteria. There is also some possible concern for aspiration event, and meropenem will cover for this as well. And chest x-ray ordered Active Problems:   Cerebral palsy ([place]) - continue meds for this, some concern by family members at the patient may be developing some dysphagia putting her at risk for aspiration. We will get a swallow evaluation   HTN (hypertension) - continue home meds   BPH (benign prostatic hyperplasia) - home medications   GERD (gastroesophageal reflux disease) - home dose PPI  All the records are reviewed and case discussed with ED provider. Management plans discussed with the patient and/or family.  DVT PROPHYLAXIS: SubQ lovenox  GI PROPHYLAXIS: PPI  ADMISSION STATUS: Observation  CODE STATUS: Partial Code Code Status History    Date Active Date Inactive Code Status Order ID Comments User Context   12/15/2014  2:54 PM 12/18/2014  9:17 PM Partial Code [id number]  Arne Cleveland, MD Inpatient   12/14/2014  2:25 PM 12/15/2014  2:54 PM DNR [id number]  Demetrios Loll, MD Inpatient    Questions for Most Recent Historical Code Status (Order [id number])    Question Answer Comment   In the event of cardiac or respiratory ARREST: Initiate Code Blue, Call Rapid Response No    In the event of cardiac or respiratory ARREST: Perform CPR No    In the event of  cardiac or respiratory ARREST: Perform Intubation/Mechanical Ventilation No    In the event of cardiac or respiratory ARREST: Use NIPPV/BiPAp only if indicated Yes    In the event of cardiac or respiratory ARREST: Administer ACLS medications if indicated Yes    In the event of cardiac or respiratory ARREST: Perform Defibrillation or Cardioversion if indicated Yes  TOTAL TIME TAKING CARE OF THIS PATIENT: 40 minutes.   Jannifer Franklin, Cheskel Silverio FIELDING 01/16/2017, 6:15 PM  [facility]  Office  [phone]  CC: Primary care physician; Adin Hector, MD  Note:  This document was prepared using Dragon voice recognition software and may include unintentional dictation errors.

## 2017-01-16 NOTE — ED Provider Notes (Signed)
[facility] Emergency Department Provider Note  ____________________________________________   I have reviewed the triage vital signs and the nursing notes.   HISTORY  Chief Complaint Near Syncope (Pt. here via EMS)    HPI Shawn Sweeney is a 61 y.o. male who presents today with a chief complaint of "not looking good in the [place]". Patient was in a transport vehicle and apparently he "did not look good". His brother is here and feels that he is at his baseline. Patient himself cannot provide history. He is unfortunately suffering from deafness as well as blindness. He has MS. He is not normally on oxygen. There is a oxygen saturation place in our computer of 84% but that was actually reported by EMS. Here, he is 100% on room air.Level 5 chart caveat; no further history available due to patient status. There is no reported actual syncope, he just seemed a little less responsive than normal apparently.  Past Medical History:  Diagnosis Date  . Abdominal wall hernia   . 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 Anemia   . Blind   . BPH (benign prostatic hyperplasia)   . Calculus of gallbladder with acute cholecystitis and obstruction   . Cerebral palsy ([place])   . Congenital deafness   . Dermatophytosis of nail   . Elevated PSA   . Hypertension   . Lower extremity edema    echo with normal left and right heart fucntion, mild MR  . Mood disorder ([place])   . Nephrolithiasis   . Onychomycosis   . Phimosis   . Pneumonia 12/18/2014    Patient Active Problem List   Diagnosis Date Noted  . Pneumonia 12/18/2014  . Hypotension 12/15/2014  . Bradycardia 12/15/2014  . Cerebral palsy with spastic/ataxic diplegia 12/15/2014  . 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 Blindness 12/15/2014  . Altered mental status 12/14/2014  . Hydronephrosis, left 12/14/2014  . Nephrolithiasis 12/14/2014    Class: Chronic    Past Surgical History:  Procedure Laterality Date  . CHOLECYSTECTOMY     . COLECTOMY    . COLON SURGERY    . COLONOSCOPY    . COLONOSCOPY WITH PROPOFOL N/A 10/08/2015   Procedure: COLONOSCOPY WITH PROPOFOL;  Surgeon: Hulen Luster, MD;  Location: [facility] ENDOSCOPY;  Service: Gastroenterology;  Laterality: N/A;  . DIAGNOSTIC LAPAROSCOPY    . ESOPHAGOGASTRODUODENOSCOPY    . ESOPHAGOGASTRODUODENOSCOPY (EGD) WITH PROPOFOL N/A 10/08/2015   Procedure: ESOPHAGOGASTRODUODENOSCOPY (EGD) WITH PROPOFOL;  Surgeon: Hulen Luster, MD;  Location: [facility] ENDOSCOPY;  Service: Gastroenterology;  Laterality: N/A;    Prior to Admission medications   Medication Sig Start Date End Date Taking? Authorizing Provider  ARIPiprazole (ABILIFY) 2 MG tablet Take 1 mg by mouth every morning.     [provider]  clonazePAM (KLONOPIN) 0.5 MG tablet Take 0.25 mg by mouth 3 (three) times daily. 06/15/12   [provider]  dutasteride (AVODART) 0.5 MG capsule Take 1 capsule by mouth every morning.     [provider]  Ferrous Sulfate (IRON) 142 (45 FE) MG TBCR Take 45 mg by mouth 2 (two) times a week.    [provider]  fexofenadine (ALLEGRA) 180 MG tablet Take 180 mg by mouth daily as needed for allergies or rhinitis.    [provider]  FINASTERIDE PO Take 5 mg by mouth daily.    [provider]  FLUoxetine (PROZAC) 20 MG capsule Take 20 mg by mouth every morning.     [provider]  fluticasone (FLONASE) 50 MCG/ACT nasal spray Place 2 sprays into both nostrils at bedtime. 06/15/12   [provider]  furosemide (LASIX) 20 MG tablet Take 20 mg by mouth daily as needed for edema.     [provider]  guaiFENesin (ROBITUSSIN) 100 MG/5ML liquid Take 100 mg by mouth every 6 (six) hours as needed for cough.    [provider]  hydrocortisone (ANUSOL-HC) 25 MG suppository Place 25 mg rectally every 12 (twelve) hours as needed (rectal bleeding).     [provider]  Multiple Vitamin (MULTI-VITAMINS) TABS Take 1  tablet by mouth daily.    [provider]  omeprazole (PRILOSEC) 20 MG capsule Take 20 mg by mouth 2 (two) times daily.    [provider]  oxybutynin (DITROPAN-XL) 5 MG 24 hr tablet Take 5 mg by mouth at bedtime.    [provider]  Phenylephrine-DM-GG (MUCINEX FAST-MAX CONGEST COUGH) 2.5-5-100 MG/5ML LIQD Take 5 mLs by mouth every 6 (six) hours as needed (cough and congestion).    [provider]  potassium chloride (K-DUR) 10 MEQ tablet Take 10 mEq by mouth daily as needed (edema). Take with lasix    [provider]  sucralfate (CARAFATE) 1 GM/10ML suspension Take 1 g by mouth 2 (two) times daily before a meal.    [provider]  tamsulosin (FLOMAX) 0.4 MG CAPS capsule Take 1 capsule by mouth daily. 06/15/12   [provider]  traMADol (ULTRAM) 50 MG tablet Take by mouth every 6 (six) hours as needed for moderate pain.    [provider]    Allergies Risperidone and Levofloxacin  Family History  Problem Relation Age of Onset  . CAD Father   . Prostate cancer Father   . Colon cancer Father     Social History Social History  Substance Use Topics  . Smoking status: Never Smoker  . Smokeless tobacco: Not on file  . Alcohol use No    Review of Systems Constitutional: No fever/chills Eyes: No visual changes. ENT: No sore throat. No stiff neck no neck pain Cardiovascular: Denies chest pain. Respiratory: Denies shortness of breath. Gastrointestinal:   no vomiting.  No diarrhea.  No constipation. Genitourinary: Negative for dysuria. Musculoskeletal: Negative lower extremity swelling Skin: Negative for rash. Neurological: Negative for severe headaches, focal weakness or numbness.   ____________________________________________   PHYSICAL EXAM:  VITAL SIGNS: ED Triage Vitals  Enc Vitals Group     BP --      Pulse Rate 01/16/17 1508 (!) 52     Resp 01/16/17 1508 15     Temp 01/16/17 1508 97.7 F (36.5  C)     Temp Source 01/16/17 1508 Axillary     SpO2 01/16/17 1504 (!) 84 %     Weight 01/16/17 1509 90 lb (40.8 kg)     Height 01/16/17 1509 5' (1.524 m)     Head Circumference --      Peak Flow --      Pain Score --      Pain Loc --      Pain Edu? --      Excl. in [place]? --     Constitutional: Patient keeps his eyes closed at baseline, is awake, does not appear to be any distress Eyes: Conjunctivae are normal Head: Atraumatic HEENT: No congestion/rhinnorhea. Mucous membranes are moist.  Oropharynx non-erythematous Neck:   Nontender with no meningismus, no masses, no stridor Cardiovascular: Normal rate, regular rhythm. Grossly normal  heart sounds.  Good peripheral circulation. Respiratory: Normal respiratory effort.  No retractions. Lungs CTAB. Abdominal: Soft and nontender. No distention. No guarding no rebound Back:  There is no focal tenderness or step off.   Musculoskeletal: No lower extremity tenderness, no upper extremity tenderness. No joint effusions, no DVT signs strong distal pulses no edema Neurologic:  No obvious focality noted Skin:  Skin is warm, dry and intact. No rash noted.   ____________________________________________   LABS (all labs ordered are listed, but only abnormal results are displayed)  Labs Reviewed  COMPREHENSIVE METABOLIC PANEL  CBC WITH DIFFERENTIAL/PLATELET  BRAIN NATRIURETIC PEPTIDE  URINALYSIS, COMPLETE (UACMP) WITH MICROSCOPIC  CK  TROPONIN I   ____________________________________________  EKG  I personally interpreted any EKGs ordered by me or triage Sinus bradycardia rate 49 bpm no acute ST elevation or depression, normal axis sinus bradycardia, largely unremarkable EKG ____________________________________________  RADIOLOGY  I reviewed any imaging ordered by me or triage that were performed during my shift and, if possible, patient and/or family made aware of any abnormal  findings. ____________________________________________   PROCEDURES  Procedure(s) performed: None  Procedures  Critical Care performed: None  ____________________________________________   INITIAL IMPRESSION / ASSESSMENT AND PLAN / ED COURSE  Pertinent labs & imaging results that were available during my care of the patient were reviewed by me and considered in my medical decision making (see chart for details).  Patient here unable to give a history however seems to be at his baseline per his family. Blood pressures in the 120s, heart rate is bradycardic pressure is good, sats are 100% although there is a reported low oxygen saturation for EMS we'll watch her closely. We'll get a chest x-ray and check broad workup on this patient cannot give a history.    ____________________________________________   FINAL CLINICAL IMPRESSION(S) / ED DIAGNOSES  Final diagnoses:  Near syncope      This chart was dictated using voice recognition software.  Despite best efforts to proofread,  errors can occur which can change meaning.      Schuyler Amor, MD 01/16/17 1535

## 2017-01-16 NOTE — ED Notes (Signed)
Report from kim, rn.  

## 2017-01-16 NOTE — ED Triage Notes (Signed)
Pt to ED by EMS from [facility]. Staff told EMS that the pt had a near syncope episode while traveling in the [place]. Upon arrival EMS states the pt's HR was 38 but increase to 60 during transport. EMS also states the pt's O2 was 84% on arrival and placed on 2L. At this time the pt is 98% on RA.

## 2017-01-17 ENCOUNTER — Inpatient Hospital Stay: Payer: Medicare Other

## 2017-01-17 LAB — BASIC METABOLIC PANEL
Anion gap: 6 (ref 5–15)
BUN: 15 mg/dL (ref 6–20)
CALCIUM: 8.2 mg/dL — AB (ref 8.9–10.3)
CO2: 28 mmol/L (ref 22–32)
Chloride: 108 mmol/L (ref 101–111)
Creatinine, Ser: 0.75 mg/dL (ref 0.61–1.24)
GFR calc Af Amer: >60 mL/min (ref >60)
GLUCOSE: 83 mg/dL (ref 65–99)
POTASSIUM: 3.6 mmol/L (ref 3.5–5.1)
Sodium: 142 mmol/L (ref 135–145)

## 2017-01-17 LAB — CBC
HEMATOCRIT: 37.1 % — AB (ref 40.0–52.0)
Hemoglobin: 13 g/dL (ref 13.0–18.0)
MCH: 33.1 pg (ref 26.0–34.0)
MCHC: 35 g/dL (ref 32.0–36.0)
MCV: 94.4 fL (ref 80.0–100.0)
Platelets: 160 10*3/uL (ref 150–440)
RBC: 3.93 MIL/uL — ABNORMAL LOW (ref 4.40–5.90)
RDW: 12.4 % (ref 11.5–14.5)
WBC: 5.1 10*3/uL (ref 3.8–10.6)

## 2017-01-17 LAB — MRSA PCR SCREENING: MRSA by PCR: NEGATIVE

## 2017-01-17 MED ORDER — ENOXAPARIN SODIUM 30 MG/0.3ML ~~LOC~~ SOLN
30.0000 mg | SUBCUTANEOUS | Status: DC
  Administered 2017-01-17: 30 mg via SUBCUTANEOUS
  Filled 2017-01-17: qty 0.3

## 2017-01-17 MED ORDER — SODIUM CHLORIDE 0.9 % IV BOLUS (SEPSIS)
1000.0000 mL | Freq: Once | INTRAVENOUS | Status: AC
  Administered 2017-01-17: 1000 mL via INTRAVENOUS

## 2017-01-17 NOTE — Evaluation (Signed)
Clinical/Bedside Swallow Evaluation Patient Details  Name: Shawn Sweeney MRN: [id number] Date of Birth: 1956/03/07  Today's Date: 01/17/2017 Time: SLP Start Time (ACUTE ONLY): 0950 SLP Stop Time (ACUTE ONLY): 1030 SLP Time Calculation (min) (ACUTE ONLY): 40 min  Past Medical History:  Past Medical History:  Diagnosis Date  . Abdominal wall hernia   . 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 Anemia   . Blind   . BPH (benign prostatic hyperplasia)   . Calculus of gallbladder with acute cholecystitis and obstruction   . Cerebral palsy ([place])   . Congenital deafness   . Dermatophytosis of nail   . Elevated PSA   . GERD (gastroesophageal reflux disease) 01/16/2017  . Hypertension   . Lower extremity edema    echo with normal left and right heart fucntion, mild MR  . Mood disorder ([place])   . Nephrolithiasis   . Onychomycosis   . Phimosis   . Pneumonia 12/18/2014   Past Surgical History:  Past Surgical History:  Procedure Laterality Date  . CHOLECYSTECTOMY    . COLECTOMY    . COLON SURGERY    . COLONOSCOPY    . COLONOSCOPY WITH PROPOFOL N/A 10/08/2015   Procedure: COLONOSCOPY WITH PROPOFOL;  Surgeon: Hulen Luster, MD;  Location: [facility] ENDOSCOPY;  Service: Gastroenterology;  Laterality: N/A;  . DIAGNOSTIC LAPAROSCOPY    . ESOPHAGOGASTRODUODENOSCOPY    . ESOPHAGOGASTRODUODENOSCOPY (EGD) WITH PROPOFOL N/A 10/08/2015   Procedure: ESOPHAGOGASTRODUODENOSCOPY (EGD) WITH PROPOFOL;  Surgeon: Hulen Luster, MD;  Location: [facility] ENDOSCOPY;  Service: Gastroenterology;  Laterality: N/A;   HPI:  Shawn Sweeney  is a 61 y.o. male who presents with Abnormal appearance. Patient has a history of congenital blindness, deafness, cerebral palsy. His caretaker said that he started appearing unwell today. Patient is unable to contribute to his own history, and all information is obtained from family members at bedside. They brought him to the ED for evaluation. EMS that he had an episode of oxygen desaturation down to about  84% in route to the hospital. He had another episode similar to this here in the hospital.  Chest x-ray did not show any sort of pneumonia, although he was found to have a UTI. There is significant concern for possible aspiration event which may be too early to see on imaging. Family states that pt had more difficulty swallowing solid over the last year but that he has not had difficulty since he was switched to a puree diet. Pt's family denies that pt has shown any difficulty swallowing thin liquids.    Assessment / Plan / Recommendation Clinical Impression  Pt presents with no apparent dysphagia with puree and liquid consistencies and oral dysphagia with solid consistencies.  Did not assess solid consistency d/t pt is edentulous and from reports from family that pt has demonstrated difficulty with solids in the past.  Pt given trials of puree, thin, nectar and honey consistencies. No overt s/s of aspiration were observed with any tested consistencies. Unable to assess vocal quality d/t pt nonverbal. Laryngel elevation appeared grossly adequate and pt did not exhibit any coughing, throat clearing or soft signs such as watery eyes with any consistency. Oral phase also appeared [facility] for tested consitencies. Pt is blind and deaf but would accept food from spoon and liquids from straw when placed at his lips. No pocketing or holding observed. Pt is judged to be a moderate risk of aspiration d/t postural difficulties and cognitive abilities. Will follow pt closely to continue to  assess least restrictive safe diet. Disucssed with pt, family and MD. SLP Visit Diagnosis: Dysphagia, oral phase (R13.11)    Aspiration Risk  Moderate aspiration risk    Diet Recommendation Dysphagia 1 (Puree);Thin liquid   Liquid Administration via: Straw Medication Administration: Crushed with puree Supervision: Staff to assist with self feeding;Full supervision/cueing for compensatory strategies Compensations: Minimize  environmental distractions;Slow rate;Small sips/bites Postural Changes: Seated upright at 90 degrees;Other (Comment) (Use rolled towels and pillows to aid with positioning pt. )    Other  Recommendations Oral Care Recommendations: Oral care BID;Staff/trained caregiver to provide oral care   Follow up Recommendations Other (comment) (Return to group home)      Frequency and Duration min 3x week  1 week       Prognosis Prognosis for Safe Diet Advancement: Fair Barriers to Reach Goals: Other (Comment) (Pt blind, deaf and non verbal with h/o CP)      Swallow Study   General Date of Onset: 01/16/17 HPI: Shawn Sweeney  is a 61 y.o. male who presents with Abnormal appearance. Patient has a history of congenital blindness, deafness, cerebral palsy. His caretaker said that he started appearing unwell today. Patient is unable to contribute to his own history, and all information is obtained from family members at bedside. They brought him to the ED for evaluation. EMS that he had an episode of oxygen desaturation down to about 84% in route to the hospital. He had another episode similar to this here in the hospital.  Chest x-ray did not show any sort of pneumonia, although he was found to have a UTI. There is significant concern for possible aspiration event which may be too early to see on imaging. Family states that pt had more difficulty swallowing solid over the last year but that he has not had difficulty since he was switched to a puree diet. Pt's family denies that pt has shown any difficulty swallowing thin liquids.  Type of Study: Bedside Swallow Evaluation Previous Swallow Assessment: None reported Diet Prior to this Study: NPO;Other (Comment) (sips of liquid) Temperature Spikes Noted: No Respiratory Status: Room air History of Recent Intubation: No Behavior/Cognition: Doesn't follow directions;Other (Comment) (Pt blind, deaf and non verbal) Oral Cavity Assessment: Within Functional Limits  (grossly assessed appeared [facility]) Oral Care Completed by SLP: No Oral Cavity - Dentition: Edentulous Vision: Impaired for self-feeding Self-Feeding Abilities: Total assist (Family reports that pt was feeding himself at his home, but that they believe staff had been assisting pt more recently) Patient Positioning: Upright in bed;Other (comment) (Pt needs extra support to stay upright) Baseline Vocal Quality: Not observed Volitional Cough: Cognitively unable to elicit Volitional Swallow: Able to elicit    Oral/Motor/Sensory Function Overall Oral Motor/Sensory Function: Other (comment) (Unable to assess d/t pt unable to follow commands.)   Ice Chips Ice chips: Not tested   Thin Liquid Thin Liquid: Within functional limits Presentation: Straw Other Comments: 4 ounces of thin liquid, no overt s/s of aspiration    Nectar Thick Nectar Thick Liquid: Within functional limits Presentation: Straw Other Comments: 3 sips of nectar, no overt s/s of aspiration   Honey Thick Honey Thick Liquid: Within functional limits Presentation: Straw Other Comments: 4 sips of honey thick, no overt s/s of aspiration   Puree Puree: Within functional limits Presentation: Spoon Other Comments: 5 tsps of puree consistency, without overt s/s of aspiration   Solid   GO   Solid: Not tested    Functional Assessment Tool Used: Clinical judgment Functional Limitations:  Swallowing Swallow Current Status [phone]): At least 1 percent but less than 20 percent impaired, limited or restricted Swallow Goal Status [phone]): At least 1 percent but less than 20 percent impaired, limited or restricted   Devon,Kasidy Gianino 01/17/2017,10:42 AM

## 2017-01-17 NOTE — Progress Notes (Signed)
Pharmacy Note  61 y/o M on Lovenox 40 mg daily for DVT prophylaxis.   Filed Weights   01/16/17 1509  Weight: 90 lb (40.8 kg)   Estimated Creatinine Clearance: 55.3 mL/min (by C-G formula based on SCr of 0.75 mg/dL).  Will decrease Lovenox dosing to 30 mg daily with due to weight of 40 kg.   Ulice Dash, PharmD Clinical Pharmacist

## 2017-01-17 NOTE — Progress Notes (Addendum)
Pt's HR continues to be low and this morning it has been between 38-42. Dr. Verdell Carmine aware. I looked at morning medications and spoke to pharmacist about medications that could affect HR. Both abilify and fluoxetine have a low potential to affect heart rate, heart rhythm, or length of QT. As pt is here for possible aspiration pneumonia and has outstanding SLP eval ordered, Dr. Verdell Carmine said it would be okay to hold pt's medications for now pending SLP eval.   Update 1130: Dr. Verdell Carmine rounding on pt. Despite SLP clearing pt to eat and drink, Dr. Verdell Carmine stated that we can hold pt's abilify and fluoxetine for now based on low HR.

## 2017-01-17 NOTE — Progress Notes (Signed)
[place] at [place] NAME: Shawn Sweeney    MR#:  [id number]  DATE OF BIRTH:  02-Apr-1956  SUBJECTIVE:   Pt. Here due to syncope And noted to be hypoxic. Patient suspected to have aspiration pneumonia along with UTI. Patient's mother and caretaker is at bedside. Patient nonverbal at baseline.  REVIEW OF SYSTEMS:    Review of Systems  Unable to perform ROS: Mental acuity    Nutrition: Dysphagia 1 with thin liquids Tolerating Diet: Yes Tolerating PT: Bedbound at baseline.   DRUG ALLERGIES:   Allergies  Allergen Reactions  . Risperidone Hives and Other (See Comments)  . Levofloxacin Other (See Comments) and Rash    VITALS:  Blood pressure (!) 118/48, pulse (!) 45, temperature 98.1 F (36.7 C), temperature source Axillary, resp. rate 18, height 5' (1.524 m), weight 40.8 kg (90 lb), SpO2 96 %.  PHYSICAL EXAMINATION:   Physical Exam  GENERAL:  61 y.o.-year-old patient lying in bed in contracted position, non-verbal but in NAD.  EYES: No scleral icterus. Extraocular muscles intact.  Blind. HEENT: Head atraumatic, normocephalic. Oropharynx and nasopharynx clear.  NECK:  Supple, no jugular venous distention. No thyroid enlargement, no tenderness.  LUNGS: Normal breath sounds bilaterally, no wheezing, rales, rhonchi. No use of accessory muscles of respiration.  CARDIOVASCULAR: S1, S2 normal. No murmurs, rubs, or gallops.  ABDOMEN: Soft, nontender, nondistended. Bowel sounds present. No organomegaly or mass.  EXTREMITIES: No cyanosis, clubbing or edema b/l.    NEUROLOGIC: Contracted lying in bed but in NAD.  Hx of Cerebral Palsy and wheelchair bound.    PSYCHIATRIC: The patient is alert and oriented x 1.  SKIN: No obvious rash, lesion, or ulcer.    LABORATORY PANEL:   CBC  Recent Labs Lab 01/17/17 0430  WBC 5.1  HGB 13.0  HCT 37.1*  PLT 160    ------------------------------------------------------------------------------------------------------------------  Chemistries   Recent Labs Lab 01/16/17 1528 01/17/17 0430  NA 138 142  K 3.7 3.6  CL 109 108  CO2 27 28  GLUCOSE 103* 83  BUN 20 15  CREATININE 1.00 0.75  CALCIUM 8.1* 8.2*  AST 23  --   ALT 17  --   ALKPHOS 92  --   BILITOT 0.4  --    ------------------------------------------------------------------------------------------------------------------  Cardiac Enzymes  Recent Labs Lab 01/16/17 1528  TROPONINI <0.03   ------------------------------------------------------------------------------------------------------------------  RADIOLOGY:  Dg Abd 1 View  Result Date: 01/16/2017 CLINICAL DATA:  UTI. EXAM: ABDOMEN - 1 VIEW COMPARISON:  None. FINDINGS: Supine view the abdomen shows no gaseous bowel dilatation suggest obstruction. Surgical clips in the right upper quadrant compatible with prior cholecystectomy. Convex leftward lumbar scoliosis noted. IMPRESSION: Nonobstructive bowel gas pattern. Electronically Signed   By: Misty Stanley M.D.   On: 01/16/2017 19:04   Portable Chest 1 View  Result Date: 01/17/2017 CLINICAL DATA:  Oxygen desaturation EXAM: PORTABLE CHEST 1 VIEW COMPARISON:  01/16/2017 FINDINGS: Progression of left lower lobe atelectasis/infiltrate. Right lung remains clear. Negative for heart failure. IMPRESSION: Progression of mild left lower lobe atelectasis/infiltrate Electronically Signed   By: Franchot Gallo M.D.   On: 01/17/2017 07:11   Dg Chest Port 1 View  Result Date: 01/16/2017 CLINICAL DATA:  Decreased responsiveness, near syncopal episode, history hypertension, cerebral palsy EXAM: PORTABLE CHEST 1 VIEW COMPARISON:  Portable exam 1535 hours compared 02/09/2016 FINDINGS: Normal heart size, mediastinal contours, and pulmonary vascularity. Lungs clear. No pleural effusion or pneumothorax. Bones demineralized. IMPRESSION: No acute  abnormalities. Electronically  Signed   By: Lavonia Dana M.D.   On: 01/16/2017 16:02     ASSESSMENT AND PLAN:   61 year old male with past medical history of cerebral palsy who was bedbound and wheelchair-bound, legally blind, congenital deafness, BPH, GERD, hypertension who presented to the hospital due to hypoxia and also lethargy.   1. Weakness/lethargy-secondary to suspected pneumonia/UTI. -Continue IV antibiotics with ceftriaxone and will follow mental status.  2. Urinary tract infection-continue IV ceftriaxone, follow urine cultures.  3. Bradycardia-patient significantly bradycardic with heart rates in the 40s but hemodynamically stable and asymptomatic. -Continue to watch on telemetry, hold any rate controlling meds.  4. Depression-continue Abilify, fluoxetine.  5. BPH-continue finasteride, Avodart.  6. Anxiety-continue Klonopin.  Discussed plan of care with Mother at bedside.   All the records are reviewed and case discussed with Care Management/Social Worker. Management plans discussed with the patient, family and they are in agreement.  CODE STATUS: Partial code  DVT Prophylaxis: Lovenox  TOTAL TIME TAKING CARE OF THIS PATIENT: 30 minutes.   POSSIBLE D/C IN 1-2 DAYS, DEPENDING ON CLINICAL CONDITION.   Henreitta Leber M.D on 01/17/2017 at 1:48 PM  Between 7am to 6pm - Pager - [phone]  After 6pm go to [email or web address] - Proofreader  [facility]  Office  [phone]  CC: Primary care physician; Adin Hector, MD

## 2017-01-17 NOTE — Progress Notes (Signed)
Pt is up to chair and his HR has increased to 57. I called Dr. Verdell Carmine to update him and he asked that I administer the abilify and fluoxetine. Will administer

## 2017-01-17 NOTE — Progress Notes (Addendum)
Dr. Verdell Carmine would like for me to call the pt's group home to speak to his caregiver about pt's normal behavioral baseline, to determine which symptoms he was exhibiting when they decided to send the pt to the hospital, and to determine if pt's HR is bradycardic at baseline. [place] called and no one was available to talk on the phone. I will attempt to recall in a little while.   Update 1400: I attempted to call Suanne Marker (according to the pt's brother and POA [Dean], she is the pt's caregiver) to inquire about pt per Dr. Edward Jolly request. Suanne Marker did not pick up. Will re-attempt again in a little while.   Update 1530: [place] (an employee at pt's group home) came to visit pt. She stated that he appears close to baseline and seems to be acting normally. She also stated that when he was sent to the emergency department it was because he was not "acting like himself". She was not sure what his normal HR is.

## 2017-01-18 LAB — URINE CULTURE: Culture: >=100000 — AB

## 2017-01-18 MED ORDER — AMOXICILLIN-POT CLAVULANATE 400-57 MG/5ML PO SUSR: 5.0000 mL | Freq: Two times a day (BID) | ORAL | 0 refills | Status: DC

## 2017-01-18 MED ORDER — CEFUROXIME AXETIL 250 MG/5ML PO SUSR: 250.0000 mg | Freq: Two times a day (BID) | ORAL | 0 refills | Status: DC

## 2017-01-18 NOTE — Clinical Social Work Note (Signed)
Clinical Social Work Assessment  Patient Details  Name: Shawn Sweeney MRN: [id number] Date of Birth: Dec 14, 1955  Date of referral:  01/18/17               Reason for consult:  Facility Placement                Permission sought to share information with:  Facility Art therapist granted to share information::  Yes, Verbal Permission Granted  Name::        Agency::     Relationship::     Contact Information:     Housing/Transportation Living arrangements for the past 2 months:  Group Home Source of Information:  Medical Team, Facility, Parent, Other (Comment Required) (Brother/HCPOA) Patient Interpreter Needed:  None Criminal Activity/Legal Involvement Pertinent to Current Situation/Hospitalization:  No - Comment as needed Significant Relationships:  Warehouse manager, Siblings, Parents Lives with:  Facility Resident Do you feel safe going back to the place where you live?  Yes Need for family participation in patient care:  Yes (Comment) (Patient has HCPOA/Brother Scientist, physiological)  Care giving concerns:  Patient admitted from a group home   Social Worker assessment / plan:  CSW met with the patient, his mother, and his HCPOA/brother Shawn Sweeney at bedside to discuss discharge planning. The family agrees for the patient to return to [facility] when stable.   Gena Fray, RN for Shawn Sweeney has confirmed that the patient can discharge today via staff transport. The CSW has prepared the discharge packet and placed on chart. Shawn Sweeney has received discharge paperwork. CSW faxed the new prescription at the request of Shawn Sweeney due to the receiving pharmacy being closed. CSW will continue to follow pending additional discharge needs.  Employment status:  Retired Forensic scientist:  Medicare PT Recommendations:  Not assessed at this time Information / Referral to community resources:     Patient/Family's Response to care: The family thanked the CSW. The patient was  sleeping.  Patient/Family's Understanding of and Emotional Response to Diagnosis, Current Treatment, and Prognosis:  The family understands the discharge plan and is in agreement as is the facility.  Emotional Assessment Appearance:  Appears stated age Attitude/Demeanor/Rapport:  Lethargic Affect (typically observed):  Stable Orientation:  Oriented to Self, Oriented to Situation, Oriented to Place Alcohol / Substance use:  Never Used Psych involvement (Current and /or in the community):  No (Comment)  Discharge Needs  Concerns to be addressed:  Discharge Planning Concerns, Care Coordination Readmission within the last 30 days:  No Current discharge risk:  Chronically ill Barriers to Discharge:  No Barriers Identified   Zettie Pho, LCSW 01/18/2017, 1:22 PM

## 2017-01-18 NOTE — Clinical Social Work Note (Signed)
CSW received notification that the patient requires a pink MOST form due to partial code status. The CSW met with the patient's family to confirm and complete the form. The attending MD has signed the form, and the form is now in the packet for discharge.   Santiago Bumpers, MSW, Latanya Presser [phone]

## 2017-01-18 NOTE — Clinical Social Work Note (Signed)
CSW has attempted to contact RN Gena Fray for clearance to discharge the patient. CSW has left a HIPPA compliant voice message and is waiting for a return call.    Santiago Bumpers, MSW, Latanya Presser [phone]

## 2017-01-18 NOTE — Discharge Summary (Signed)
[place] [facility] [place] NAME: Shawn Sweeney    MR#:  [id number]  DATE OF BIRTH:  07-Jun-1956  DATE OF ADMISSION:  01/16/2017 ADMITTING PHYSICIAN: Lance Coon, MD  DATE OF DISCHARGE: 01/18/2017  PRIMARY CARE PHYSICIAN: Adin Hector, MD    ADMISSION DIAGNOSIS:  Kidney stones [N20.0] Elevated CK [R74.8] Near syncope [R55] Urinary tract infection without hematuria, site unspecified [N39.0]  DISCHARGE DIAGNOSIS:  Principal Problem:   UTI (urinary tract infection) Active Problems:   Cerebral palsy (HCC)   HTN (hypertension)   BPH (benign prostatic hyperplasia)   GERD (gastroesophageal reflux disease)   SECONDARY DIAGNOSIS:   Past Medical History:  Diagnosis Date  . Abdominal wall hernia   . 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 Anemia   . Blind   . BPH (benign prostatic hyperplasia)   . Calculus of gallbladder with acute cholecystitis and obstruction   . Cerebral palsy ([place])   . Congenital deafness   . Dermatophytosis of nail   . Elevated PSA   . GERD (gastroesophageal reflux disease) 01/16/2017  . Hypertension   . Lower extremity edema    echo with normal left and right heart fucntion, mild MR  . Mood disorder ([place])   . Nephrolithiasis   . Onychomycosis   . Phimosis   . Pneumonia 12/18/2014    HOSPITAL COURSE:   61 year old male with past medical history of cerebral palsy who was bedbound and wheelchair-bound, legally blind, congenital deafness, BPH, GERD, hypertension who presented to the hospital due to hypoxia and also lethargy.   1. Weakness/lethargy-secondary to UTI/Pneumonia. - pt. Was treated with IV Ceftriaxone while in the hospital and now being discharged on Oral Augmentin suspension.  - his weakness/mental status is back to baseline now.     2. Urinary tract infection- treated with IV Ceftriaxone while in the hospital and urine cultures + for Group B strep.  - pt. Is being discharged on a few days of oral  Augmentin  3. Bradycardia-pt. Was bradycardic with HR in the 40's but asymptomatic and hemodynamically stable.  - no acute issue presently.    4. Depression- pt. Will continue his Abilify, fluoxetine.  5. BPH- pt. Will continue finasteride, Avodart.  6. Anxiety- pt. Will continue Klonopin.  DISCHARGE CONDITIONS:   Stable.   CONSULTS OBTAINED:    DRUG ALLERGIES:   Allergies  Allergen Reactions  . Risperidone Hives and Other (See Comments)  . Levofloxacin Other (See Comments) and Rash    DISCHARGE MEDICATIONS:   Allergies as of 01/18/2017      Reactions   Risperidone Hives, Other (See Comments)   Levofloxacin Other (See Comments), Rash      Medication List    TAKE these medications   ABILIFY 2 MG tablet Generic drug:  ARIPiprazole Take 1 mg by mouth every morning.   amoxicillin-clavulanate 400-57 MG/5ML suspension Commonly known as:  AUGMENTIN Take 5 mLs by mouth 2 (two) times daily.   docusate sodium 100 MG capsule Commonly known as:  COLACE Take 100 mg by mouth 2 (two) times daily.   fexofenadine 180 MG tablet Commonly known as:  ALLEGRA Take 180 mg by mouth daily as needed for allergies or rhinitis.   FINASTERIDE PO Take 5 mg by mouth daily.   FLOMAX 0.4 MG Caps capsule Generic drug:  tamsulosin Take 1 capsule by mouth daily.   FLONASE 50 MCG/ACT nasal spray Generic drug:  fluticasone Place 2 sprays into both nostrils at bedtime.  FLUoxetine 20 MG capsule Commonly known as:  PROZAC Take 20 mg by mouth every morning.   furosemide 20 MG tablet Commonly known as:  LASIX Take 20 mg by mouth daily as needed for edema.   guaiFENesin 100 MG/5ML liquid Commonly known as:  ROBITUSSIN Take 100 mg by mouth every 6 (six) hours as needed for cough.   hydrocortisone 25 MG suppository Commonly known as:  ANUSOL-HC Place 25 mg rectally every 12 (twelve) hours as needed (rectal bleeding).   KLONOPIN 0.5 MG tablet Generic drug:  clonazePAM Take  0.25 mg by mouth 3 (three) times daily.   MUCINEX FAST-MAX CONGEST COUGH 2.5-5-100 MG/5ML Liqd Generic drug:  Phenylephrine-DM-GG Take 5 mLs by mouth every 6 (six) hours as needed (cough and congestion).   MULTI-VITAMINS Tabs Take 1 tablet by mouth daily.   NAMENDA XR 28 MG Cp24 24 hr capsule Generic drug:  memantine Take 28 mg by mouth daily.   omeprazole 20 MG capsule Commonly known as:  PRILOSEC Take 20 mg by mouth 2 (two) times daily.   polyethylene glycol packet Commonly known as:  MIRALAX / GLYCOLAX Take 17 g by mouth daily.   potassium chloride 10 MEQ tablet Commonly known as:  K-DUR Take 10 mEq by mouth daily as needed (edema). Take with lasix   rivastigmine 9.5 mg/24hr Commonly known as:  EXELON Place 9.5 mg onto the skin daily.   sucralfate 1 GM/10ML suspension Commonly known as:  CARAFATE Take 1 g by mouth 2 (two) times daily before a meal.   traMADol 50 MG tablet Commonly known as:  ULTRAM Take by mouth every 6 (six) hours as needed for moderate pain.         DISCHARGE INSTRUCTIONS:   DIET:  Regular diet. Dysphagia 1 with thin liquids.   DISCHARGE CONDITION:  Stable  ACTIVITY:  Activity as tolerated  OXYGEN:  Home Oxygen: No.   Oxygen Delivery: room air  DISCHARGE LOCATION:  group home   If you experience worsening of your admission symptoms, develop shortness of breath, life threatening emergency, suicidal or homicidal thoughts you must seek medical attention immediately by calling 911 or calling your MD immediately  if symptoms less severe.  You Must read complete instructions/literature along with all the possible adverse reactions/side effects for all the Medicines you take and that have been prescribed to you. Take any new Medicines after you have completely understood and accpet all the possible adverse reactions/side effects.   Please note  You were cared for by a hospitalist during your hospital stay. If you have any questions  about your discharge medications or the care you received while you were in the hospital after you are discharged, you can call the unit and asked to speak with the hospitalist on call if the hospitalist that took care of you is not available. Once you are discharged, your primary care physician will handle any further medical issues. Please note that NO REFILLS for any discharge medications will be authorized once you are discharged, as it is imperative that you return to your primary care physician (or establish a relationship with a primary care physician if you do not have one) for your aftercare needs so that they can reassess your need for medications and monitor your lab values.     Today   Mental status back to baseline.  No fever overnight.  Family at bedside.   VITAL SIGNS:  Blood pressure (!) 89/51, pulse (!) 58, temperature 99.2 F (37.3 C), temperature  source Oral, resp. rate 18, height 5' (1.524 m), weight 40.8 kg (90 lb), SpO2 98 %.  I/O:    Intake/Output Summary (Last 24 hours) at 01/18/17 1234 Last data filed at 01/18/17 1001  Gross per 24 hour  Intake             1720 ml  Output                0 ml  Net             1720 ml    PHYSICAL EXAMINATION:   GENERAL:  61 y.o.-year-old patient lying in bed in contracted position, non-verbal but in NAD.  EYES: No scleral icterus. Extraocular muscles intact.  Blind. HEENT: Head atraumatic, normocephalic. Oropharynx and nasopharynx clear.  NECK:  Supple, no jugular venous distention. No thyroid enlargement, no tenderness.  LUNGS: Normal breath sounds bilaterally, no wheezing, rales, rhonchi. No use of accessory muscles of respiration.  CARDIOVASCULAR: S1, S2 normal. No murmurs, rubs, or gallops.  ABDOMEN: Soft, nontender, nondistended. Bowel sounds present. No organomegaly or mass.  EXTREMITIES: No cyanosis, clubbing or edema b/l.    NEUROLOGIC: Contracted lying in bed but in NAD.  Hx of Cerebral Palsy and wheelchair bound.     PSYCHIATRIC: The patient is alert and oriented x 1.  SKIN: No obvious rash, lesion, or ulcer.   DATA REVIEW:   CBC  Recent Labs Lab 01/17/17 0430  WBC 5.1  HGB 13.0  HCT 37.1*  PLT 160    Chemistries   Recent Labs Lab 01/16/17 1528 01/17/17 0430  NA 138 142  K 3.7 3.6  CL 109 108  CO2 27 28  GLUCOSE 103* 83  BUN 20 15  CREATININE 1.00 0.75  CALCIUM 8.1* 8.2*  AST 23  --   ALT 17  --   ALKPHOS 92  --   BILITOT 0.4  --     Cardiac Enzymes  Recent Labs Lab 01/16/17 1528  TROPONINI <0.03    Microbiology Results  Results for orders placed or performed during the hospital encounter of 01/16/17  Urine culture     Status: Abnormal   Collection Time: 01/16/17  4:42 PM  Result Value Ref Range Status   Specimen Description URINE, RANDOM  Final   Special Requests NONE  Final   Culture (A)  Final    >=100,000 COLONIES/mL GROUP B STREP(S.AGALACTIAE)ISOLATED TESTING AGAINST S. AGALACTIAE NOT ROUTINELY PERFORMED DUE TO PREDICTABILITY OF AMP/PEN/VAN SUSCEPTIBILITY. Performed at [facility], [place] [address] [place], [place] [address]    Report Status 01/18/2017 FINAL  Final  MRSA PCR Screening     Status: None   Collection Time: 01/17/17  1:18 AM  Result Value Ref Range Status   MRSA by PCR NEGATIVE NEGATIVE Final    Comment:        The GeneXpert MRSA Assay (FDA approved for NASAL specimens only), is one component of a comprehensive MRSA colonization surveillance program. It is not intended to diagnose MRSA infection nor to guide or monitor treatment for MRSA infections.     RADIOLOGY:  Dg Abd 1 View  Result Date: 01/16/2017 CLINICAL DATA:  UTI. EXAM: ABDOMEN - 1 VIEW COMPARISON:  None. FINDINGS: Supine view the abdomen shows no gaseous bowel dilatation suggest obstruction. Surgical clips in the right upper quadrant compatible with prior cholecystectomy. Convex leftward lumbar scoliosis noted. IMPRESSION: Nonobstructive bowel gas pattern.  Electronically Signed   By: Misty Stanley M.D.   On: 01/16/2017 19:04  Portable Chest 1 View  Result Date: 01/17/2017 CLINICAL DATA:  Oxygen desaturation EXAM: PORTABLE CHEST 1 VIEW COMPARISON:  01/16/2017 FINDINGS: Progression of left lower lobe atelectasis/infiltrate. Right lung remains clear. Negative for heart failure. IMPRESSION: Progression of mild left lower lobe atelectasis/infiltrate Electronically Signed   By: Franchot Gallo M.D.   On: 01/17/2017 07:11   Dg Chest Port 1 View  Result Date: 01/16/2017 CLINICAL DATA:  Decreased responsiveness, near syncopal episode, history hypertension, cerebral palsy EXAM: PORTABLE CHEST 1 VIEW COMPARISON:  Portable exam 1535 hours compared 02/09/2016 FINDINGS: Normal heart size, mediastinal contours, and pulmonary vascularity. Lungs clear. No pleural effusion or pneumothorax. Bones demineralized. IMPRESSION: No acute abnormalities. Electronically Signed   By: Lavonia Dana M.D.   On: 01/16/2017 16:02      Management plans discussed with the patient, family and they are in agreement.  CODE STATUS:     Code Status Orders        Start     Ordered   01/16/17 2109  Limited resuscitation (code)  Continuous    Question Answer Comment  In the event of cardiac or respiratory ARREST: Initiate Code Blue, Call Rapid Response No   In the event of cardiac or respiratory ARREST: Perform CPR No   In the event of cardiac or respiratory ARREST: Perform Intubation/Mechanical Ventilation No   In the event of cardiac or respiratory ARREST: Use NIPPV/BiPAp only if indicated Yes   In the event of cardiac or respiratory ARREST: Administer ACLS medications if indicated Yes   In the event of cardiac or respiratory ARREST: Perform Defibrillation or Cardioversion if indicated Yes      01/16/17 2109      TOTAL TIME TAKING CARE OF THIS PATIENT: 40 minutes.    Henreitta Leber M.D on 01/18/2017 at 12:34 PM  Between 7am to 6pm - Pager - [phone]  After 6pm go  to [email or web address] - Proofreader  [facility]  Office  [phone]  CC: Primary care physician; Adin Hector, MD

## 2017-01-18 NOTE — Progress Notes (Signed)
Pt VS Stable. Family at bedside. Belongings packed up. PIV removed. Telemetry discontinued. Nurse at [name] stated someone will be coming at 2pm to pick up pt. No concerns voiced at this time.   Bethann Punches, RN

## 2017-01-18 NOTE — NC FL2 (Signed)
[place] LEVEL OF CARE SCREENING TOOL     IDENTIFICATION  Patient Name: Shawn Sweeney Birthdate: 11/11/1955 Sex: male Admission Date (Current Location): 01/16/2017  [place] and [place] Number:  Engineering geologist and Address:  [facility], [address] [place], [place] [address]      Provider Number: [id number]  Attending Physician Name and Address:  Henreitta Leber, MD  Relative Name and Phone Number:       Current Level of Care: Hospital Recommended Level of Care: [facility], Other (Comment) (Group Home) Prior Approval Number:    Date Approved/Denied:   PASRR Number:    Discharge Plan: Other (Comment) (Group home)    Current Diagnoses: Patient Active Problem List   Diagnosis Date Noted  . UTI (urinary tract infection) 01/16/2017  . HTN (hypertension) 01/16/2017  . BPH (benign prostatic hyperplasia) 01/16/2017  . GERD (gastroesophageal reflux disease) 01/16/2017  . Pneumonia 12/18/2014  . Hypotension 12/15/2014  . Bradycardia 12/15/2014  . Cerebral palsy ([place]) 12/15/2014  . 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 Blindness 12/15/2014  . Altered mental status 12/14/2014  . Hydronephrosis, left 12/14/2014  . Nephrolithiasis 12/14/2014    Class: Chronic    Orientation RESPIRATION BLADDER Height & Weight     Self, Place  Normal Continent Weight: 90 lb (40.8 kg) Height:  5' (152.4 cm)  BEHAVIORAL SYMPTOMS/MOOD NEUROLOGICAL BOWEL NUTRITION STATUS      Continent   Dysphagia 1 with thin liquids.   AMBULATORY STATUS COMMUNICATION OF NEEDS Skin   Independent Verbally Normal                       Personal Care Assistance Level of Assistance  Bathing, Feeding, Dressing Bathing Assistance: Limited assistance Feeding assistance: Independent Dressing Assistance: Limited assistance     Functional Limitations Info             SPECIAL CARE FACTORS FREQUENCY                       Contractures  Contractures Info: Present    Additional Factors Info  Code Status, Allergies Code Status Info: Partial Allergies Info: Risperidone, Levofloxacin           Current Medications (01/18/2017):  This is the current hospital active medication list Current Facility-Administered Medications  Medication Dose Route Frequency Provider Last Rate Last Dose  . acetaminophen (TYLENOL) tablet 650 mg  650 mg Oral Q6H PRN Lance Coon, MD       Or  . acetaminophen (TYLENOL) suppository 650 mg  650 mg Rectal Q6H PRN Lance Coon, MD      . ARIPiprazole (ABILIFY) tablet 1 mg  1 mg Oral Brigid Re, MD   1 mg at 01/18/17 0858  . cefTRIAXone (ROCEPHIN) 1 g in dextrose 5 % 50 mL IVPB  1 g Intravenous Q24H Hugelmeyer, Alexis, DO   Stopped at 01/17/17 2208  . clonazePAM (KLONOPIN) tablet 0.25 mg  0.25 mg Oral TID Lance Coon, MD   0.25 mg at 01/18/17 0857  . dutasteride (AVODART) capsule 0.5 mg  0.5 mg Oral Brigid Re, MD   0.5 mg at 01/18/17 0859  . enoxaparin (LOVENOX) injection 30 mg  30 mg Subcutaneous Q24H Napoleon Form, RPH   30 mg at 01/17/17 2138  . finasteride (PROSCAR) tablet 5 mg  5 mg Oral Daily Lance Coon, MD   5 mg at 01/18/17 0857  . FLUoxetine (PROZAC)  capsule 20 mg  20 mg Oral Brigid Re, MD   20 mg at 01/18/17 0858  . guaiFENesin (ROBITUSSIN) 100 MG/5ML solution 100 mg  100 mg Oral Q6H PRN Lance Coon, MD      . ondansetron [facility]) tablet 4 mg  4 mg Oral Q6H PRN Lance Coon, MD       Or  . ondansetron [facility]) injection 4 mg  4 mg Intravenous Q6H PRN Lance Coon, MD      . pantoprazole sodium (PROTONIX) 40 mg/20 mL oral suspension 20 mg  20 mg Oral BID Lance Coon, MD   20 mg at 01/18/17 0867  . sucralfate (CARAFATE) 1 GM/10ML suspension 1 g  1 g Oral BID Kenn File, MD   1 g at 01/18/17 0859  . tamsulosin (FLOMAX) capsule 0.4 mg  0.4 mg Oral Daily Lance Coon, MD   0.4 mg at 01/18/17 0858  . traMADol (ULTRAM) tablet 50 mg  50 mg Oral  Q6H PRN Lance Coon, MD         Discharge Medications: Medication List     TAKE these medications   ABILIFY 2 MG tablet Generic drug:  ARIPiprazole Take 1 mg by mouth every morning.   amoxicillin-clavulanate 400-57 MG/5ML suspension Commonly known as:  AUGMENTIN Take 5 mLs by mouth 2 (two) times daily.   docusate sodium 100 MG capsule Commonly known as:  COLACE Take 100 mg by mouth 2 (two) times daily.   fexofenadine 180 MG tablet Commonly known as:  ALLEGRA Take 180 mg by mouth daily as needed for allergies or rhinitis.   FINASTERIDE PO Take 5 mg by mouth daily.   FLOMAX 0.4 MG Caps capsule Generic drug:  tamsulosin Take 1 capsule by mouth daily.   FLONASE 50 MCG/ACT nasal spray Generic drug:  fluticasone Place 2 sprays into both nostrils at bedtime.   FLUoxetine 20 MG capsule Commonly known as:  PROZAC Take 20 mg by mouth every morning.   furosemide 20 MG tablet Commonly known as:  LASIX Take 20 mg by mouth daily as needed for edema.   guaiFENesin 100 MG/5ML liquid Commonly known as:  ROBITUSSIN Take 100 mg by mouth every 6 (six) hours as needed for cough.   hydrocortisone 25 MG suppository Commonly known as:  ANUSOL-HC Place 25 mg rectally every 12 (twelve) hours as needed (rectal bleeding).   KLONOPIN 0.5 MG tablet Generic drug:  clonazePAM Take 0.25 mg by mouth 3 (three) times daily.   MUCINEX FAST-MAX CONGEST COUGH 2.5-5-100 MG/5ML Liqd Generic drug:  Phenylephrine-DM-GG Take 5 mLs by mouth every 6 (six) hours as needed (cough and congestion).   MULTI-VITAMINS Tabs Take 1 tablet by mouth daily.   NAMENDA XR 28 MG Cp24 24 hr capsule Generic drug:  memantine Take 28 mg by mouth daily.   omeprazole 20 MG capsule Commonly known as:  PRILOSEC Take 20 mg by mouth 2 (two) times daily.   polyethylene glycol packet Commonly known as:  MIRALAX / GLYCOLAX Take 17 g by mouth daily.   potassium chloride 10 MEQ tablet Commonly  known as:  K-DUR Take 10 mEq by mouth daily as needed (edema). Take with lasix   rivastigmine 9.5 mg/24hr Commonly known as:  EXELON Place 9.5 mg onto the skin daily.   sucralfate 1 GM/10ML suspension Commonly known as:  CARAFATE Take 1 g by mouth 2 (two) times daily before a meal.   traMADol 50 MG tablet Commonly known as:  ULTRAM Take by mouth every  6 (six) hours as needed for moderate pain.     Relevant Imaging Results:  Relevant Lab Results:   Additional Information SS# [id number]  Zettie Pho, LCSW

## 2017-02-05 ENCOUNTER — Emergency Department
Admission: EM | Admit: 2017-02-05 | Discharge: 2017-02-05 | Disposition: A | Discharge status: Home / Self Care | Payer: Medicare Other | Attending: Emergency Medicine | Admitting: Emergency Medicine

## 2017-02-05 ENCOUNTER — Emergency Department: Payer: Medicare Other

## 2017-02-05 DIAGNOSIS — R55 Syncope and collapse: Secondary | ICD-10-CM | POA: Diagnosis present

## 2017-02-05 DIAGNOSIS — I10 Essential (primary) hypertension: Secondary | ICD-10-CM | POA: Diagnosis not present

## 2017-02-05 DIAGNOSIS — G809 Cerebral palsy, unspecified: Secondary | ICD-10-CM | POA: Diagnosis not present

## 2017-02-05 DIAGNOSIS — Z79899 Other long term (current) drug therapy: Secondary | ICD-10-CM | POA: Insufficient documentation

## 2017-02-05 LAB — CBC WITH DIFFERENTIAL/PLATELET
BASOS ABS: 0 10*3/uL (ref 0–0.1)
Basophils Relative: 1 %
Eosinophils Absolute: 0.4 10*3/uL (ref 0–0.7)
Eosinophils Relative: 7 %
HEMATOCRIT: 41.2 % (ref 40.0–52.0)
HEMOGLOBIN: 13.8 g/dL (ref 13.0–18.0)
Lymphocytes Relative: 22 %
Lymphs Abs: 1.2 10*3/uL (ref 1.0–3.6)
MCH: 31.6 pg (ref 26.0–34.0)
MCHC: 33.6 g/dL (ref 32.0–36.0)
MCV: 94.2 fL (ref 80.0–100.0)
Monocytes Absolute: 0.4 10*3/uL (ref 0.2–1.0)
Monocytes Relative: 8 %
NEUTROS ABS: 3.4 10*3/uL (ref 1.4–6.5)
Neutrophils Relative %: 62 %
PLATELETS: 174 10*3/uL (ref 150–440)
RBC: 4.37 MIL/uL — AB (ref 4.40–5.90)
RDW: 12.6 % (ref 11.5–14.5)
WBC: 5.4 10*3/uL (ref 3.8–10.6)

## 2017-02-05 LAB — COMPREHENSIVE METABOLIC PANEL
ALT: 11 U/L — ABNORMAL LOW (ref 17–63)
AST: 12 U/L — AB (ref 15–41)
Albumin: 3.5 g/dL (ref 3.5–5.0)
Alkaline Phosphatase: 71 U/L (ref 38–126)
Anion gap: 5 (ref 5–15)
BUN: 21 mg/dL — ABNORMAL HIGH (ref 6–20)
CHLORIDE: 104 mmol/L (ref 101–111)
CO2: 30 mmol/L (ref 22–32)
Calcium: 8.6 mg/dL — ABNORMAL LOW (ref 8.9–10.3)
Creatinine, Ser: 0.81 mg/dL (ref 0.61–1.24)
Glucose, Bld: 99 mg/dL (ref 65–99)
POTASSIUM: 3.9 mmol/L (ref 3.5–5.1)
SODIUM: 139 mmol/L (ref 135–145)
Total Bilirubin: 0.4 mg/dL (ref 0.3–1.2)
Total Protein: 6.8 g/dL (ref 6.5–8.1)

## 2017-02-05 LAB — URINALYSIS, COMPLETE (UACMP) WITH MICROSCOPIC
BACTERIA UA: NONE SEEN
BILIRUBIN URINE: NEGATIVE
Glucose, UA: NEGATIVE mg/dL
HGB URINE DIPSTICK: NEGATIVE
Ketones, ur: NEGATIVE mg/dL
LEUKOCYTES UA: NEGATIVE
NITRITE: NEGATIVE
Protein, ur: NEGATIVE mg/dL
SPECIFIC GRAVITY, URINE: 1.02 (ref 1.005–1.030)
Squamous Epithelial / LPF: NONE SEEN
pH: 5 (ref 5.0–8.0)

## 2017-02-05 LAB — CK: Total CK: 74 U/L (ref 49–397)

## 2017-02-05 LAB — TROPONIN I

## 2017-02-05 NOTE — Discharge Instructions (Signed)
As we discussed, your workup today was reassuring.  Though we do not know exactly what is causing your symptoms, it appears that you have no emergent medical condition at this time and that you are safe to go home and follow up as recommended in this paperwork.  There is no evidence of a urinary tract infection (UTI) at this time except for a very few white blood cells in the urine.  We sent the urine to culture.  If evidence of a bacterial UTI develops in the culture over time, you will be contacted so that antibiotics can be called in to the pharmacy.  Please return immediately to the Emergency Department if you develop any new or worsening symptoms that concern you.

## 2017-02-05 NOTE — ED Notes (Signed)
Paperwork with group home worker.

## 2017-02-05 NOTE — ED Provider Notes (Signed)
[facility] Emergency Department Provider Note  ____________________________________________   First MD Initiated Contact with Patient 02/05/17 1334     (approximate)  I have reviewed the triage vital signs and the nursing notes.   HISTORY  Chief Complaint Loss of Consciousness  Level 5 caveat:  history/ROS limited by chronic non-verbal status  HPI Shawn Sweeney is a 61 y.o. 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  he presents today by EMS for evaluation of an unresponsive state.  Reportedly his group home took him out on an excursion to the movie theater and after the movie he was not responsive to them.  They put him on the ground and gave him a sternal rub and he woke up.  He has family and caregivers present in the room at this time and they all state he is back to his baseline which is non-verbal but he does seem to be awake and occasionally makes some vocalizations.  His family was concerned becauseseveral weeks ago he "did not look good" and when he came to the emergency department he was found to be hypoxemic and ended up having both pneumonia and a urinary tract infection.  They were concerned that a similar issue may be going on this time although he does not have any history of fever, shortness of breath, vomiting, and he has not indicated any pain.   Past Medical History:  Diagnosis Date  . Abdominal wall hernia   . 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 Anemia   . Blind   . BPH (benign prostatic hyperplasia)   . Calculus of gallbladder with acute cholecystitis and obstruction   . Cerebral palsy ([place])   . Congenital deafness   . Dermatophytosis of nail   . Elevated PSA   . GERD (gastroesophageal reflux disease) 01/16/2017  . Hypertension   . Lower extremity edema    echo with normal left and right heart fucntion, mild MR  . Mood disorder ([place])   . Nephrolithiasis   .  Onychomycosis   . Phimosis   . Pneumonia 12/18/2014    Patient Active Problem List   Diagnosis Date Noted  . UTI (urinary tract infection) 01/16/2017  . HTN (hypertension) 01/16/2017  . BPH (benign prostatic hyperplasia) 01/16/2017  . GERD (gastroesophageal reflux disease) 01/16/2017  . Pneumonia 12/18/2014  . Hypotension 12/15/2014  . Bradycardia 12/15/2014  . Cerebral palsy ([place]) 12/15/2014  . 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 Blindness 12/15/2014  . Altered mental status 12/14/2014  . Hydronephrosis, left 12/14/2014  . Nephrolithiasis 12/14/2014    Class: Chronic    Past Surgical History:  Procedure Laterality Date  . CHOLECYSTECTOMY    . COLECTOMY    . COLON SURGERY    . COLONOSCOPY    . COLONOSCOPY WITH PROPOFOL N/A 10/08/2015   Procedure: COLONOSCOPY WITH PROPOFOL;  Surgeon: Hulen Luster, MD;  Location: [facility] ENDOSCOPY;  Service: Gastroenterology;  Laterality: N/A;  . DIAGNOSTIC LAPAROSCOPY    . ESOPHAGOGASTRODUODENOSCOPY    . ESOPHAGOGASTRODUODENOSCOPY (EGD) WITH PROPOFOL N/A 10/08/2015   Procedure: ESOPHAGOGASTRODUODENOSCOPY (EGD) WITH PROPOFOL;  Surgeon: Hulen Luster, MD;  Location: [facility] ENDOSCOPY;  Service: Gastroenterology;  Laterality: N/A;    Prior to Admission medications   Medication Sig Start Date End Date Taking? Authorizing Provider  ARIPiprazole (ABILIFY) 2 MG tablet Take 3 mg by mouth daily. Take 1 and a 1/2 tablets by mouth daily.   Yes [provider]  clonazePAM (  KLONOPIN) 0.5 MG tablet Take 0.5 mg by mouth 2 (two) times daily.  06/15/12  Yes [provider]  docusate sodium (COLACE) 100 MG capsule Take 100 mg by mouth 2 (two) times daily.   Yes [provider]  fexofenadine (ALLEGRA) 180 MG tablet Take 180 mg by mouth daily as needed for allergies or rhinitis.   Yes [provider]  FINASTERIDE PO Take 5 mg by mouth daily.   Yes [provider]  FLUoxetine (PROZAC) 20 MG capsule Take 20 mg by mouth every morning.     Yes [provider]  fluticasone (FLONASE) 50 MCG/ACT nasal spray Place 2 sprays into both nostrils at bedtime. 06/15/12  Yes [provider]  furosemide (LASIX) 20 MG tablet Take 20 mg by mouth daily as needed for edema.    Yes [provider]  hydrocortisone (ANUSOL-HC) 25 MG suppository Place 25 mg rectally every 12 (twelve) hours as needed (rectal bleeding).    Yes [provider]  memantine (NAMENDA XR) 28 MG CP24 24 hr capsule Take 28 mg by mouth daily.   Yes [provider]  Multiple Vitamin (MULTI-VITAMINS) TABS Take 1 tablet by mouth daily.   Yes [provider]  omeprazole (PRILOSEC) 20 MG capsule Take 20 mg by mouth 2 (two) times daily.   Yes [provider]  Phenylephrine-DM-GG (MUCINEX FAST-MAX CONGEST COUGH) 2.5-5-100 MG/5ML LIQD Take 5 mLs by mouth every 6 (six) hours as needed (cough and congestion).   Yes [provider]  polyethylene glycol (MIRALAX / GLYCOLAX) packet Take 17 g by mouth daily.   Yes [provider]  potassium chloride (K-DUR) 10 MEQ tablet Take 10 mEq by mouth daily as needed (edema). Take with lasix   Yes [provider]  rivastigmine (EXELON) 9.5 mg/24hr Place 9.5 mg onto the skin daily.   Yes [provider]  sucralfate (CARAFATE) 1 GM/10ML suspension Take 1 g by mouth 2 (two) times daily before a meal.   Yes [provider]  tamsulosin (FLOMAX) 0.4 MG CAPS capsule Take 1 capsule by mouth daily. 06/15/12  Yes [provider]  traMADol (ULTRAM) 50 MG tablet Take by mouth every 6 (six) hours as needed for moderate pain.   Yes [provider]  amoxicillin-clavulanate (AUGMENTIN) 400-57 MG/5ML suspension Take 5 mLs by mouth 2 (two) times daily. Patient not taking: Reported on 02/05/2017 01/18/17   Henreitta Leber, MD  guaiFENesin (ROBITUSSIN) 100 MG/5ML liquid Take 100 mg by mouth every 6 (six) hours as needed for cough.    [provider]    Allergies Risperidone and Levofloxacin  Family History  Problem Relation Age of Onset  . CAD Father   . Prostate cancer Father   . Colon cancer Father     Social History Social History  Substance Use Topics  . Smoking status: Never Smoker  . Smokeless tobacco: Never Used  . Alcohol use No    Review of Systems Level 5 caveat:  history/ROS limited by chronic non-verbal status ____________________________________________   PHYSICAL EXAM:  VITAL SIGNS: ED Triage Vitals  Enc Vitals Group     BP 02/05/17 1250 122/74     Pulse Rate 02/05/17 1250 (!) 50     Resp 02/05/17 1250 18     Temp 02/05/17 1250 98 F (36.7 C)     Temp Source 02/05/17 1250 Axillary     SpO2 --      Weight 02/05/17 1243 40.8 kg (90 lb)  Height 02/05/17 1243 1.524 m (5')     Head Circumference --      Peak Flow --      Pain Score --      Pain Loc --      Pain Edu? --      Excl. in [place]? --     Constitutional: Chronically ill, but not in acute distress Eyes: Congenital blindness HEENT: No congestion/rhinnorhea Head: Atraumatic.  Cardiovascular: Normal rate, regular rhythm. Good peripheral circulation. Grossly normal heart sounds. Respiratory: Normal respiratory effort.  No retractions. Lungs CTAB. Gastrointestinal: Soft and nontender. No distention.  Musculoskeletal: No lower extremity tenderness nor edema. No gross deformities of extremities. Neurologic:  No obvious gross focal neurologic deficits appreciated, but patient cannot participate in neuro exam Skin:  Skin is warm, dry and intact. No rash noted.   ____________________________________________   LABS (all labs ordered are listed, but only abnormal results are displayed)  Labs Reviewed  COMPREHENSIVE METABOLIC PANEL - Abnormal; Notable for the following:       Result Value   BUN 21 (*)    Calcium 8.6 (*)    AST 12 (*)    ALT 11 (*)    All other components within normal limits  CBC WITH  DIFFERENTIAL/PLATELET - Abnormal; Notable for the following:    RBC 4.37 (*)    All other components within normal limits  URINALYSIS, COMPLETE (UACMP) WITH MICROSCOPIC - Abnormal; Notable for the following:    Color, Urine YELLOW (*)    APPearance HAZY (*)    All other components within normal limits  URINE CULTURE  TROPONIN I  CK   ____________________________________________  EKG  ED ECG REPORT I, Quamere Mussell, the attending physician, personally viewed and interpreted this ECG.  Date: 02/05/2017 EKG Time: 12:50 Rate: 50 Rhythm: sinus bradycardia QRS Axis: borderline RAD Intervals: normal ST/T Wave abnormalities: normal Narrative Interpretation: unremarkable  ____________________________________________  RADIOLOGY   Dg Chest Portable 1 View  Result Date: 02/05/2017 CLINICAL DATA:  Unresponsiveness. EXAM: PORTABLE CHEST 1 VIEW COMPARISON:  Chest x-ray dated January 17, 2017. FINDINGS: The cardiomediastinal silhouette is normal in size. Normal pulmonary vascularity. No focal consolidation, pleural effusion, or pneumothorax. No acute osseous abnormality. Cholecystectomy. IMPRESSION: No active cardiopulmonary disease. Electronically Signed   By: Titus Dubin M.D.   On: 02/05/2017 14:20    ____________________________________________   PROCEDURES  Critical Care performed: No   Procedure(s) performed:   Procedures   ____________________________________________   INITIAL IMPRESSION / ASSESSMENT AND PLAN / ED COURSE  Pertinent labs & imaging results that were available during my care of the patient were reviewed by me and considered in my medical decision making (see chart for details).  the patient recently had an increase of his dose of Klonopin and Abilify which may account for his episode of syncope or increased somnolence.  It is difficult to assess given that he is minimally interactive at baseline but his family and caregivers report that he seems to be at  his baseline now.  Given his recent infections I will evaluate him broadly with a urinalysis, lab work, and chest x-rays,but the family is comfortable with the plan for him to return to his facility if the workup does not indicate any acute or emergent medical condition.   Clinical Course as of Feb 05 1609  Thu Feb 05, 2017  1606 patient still seems to be at his baseline per his caregivers.  He had a few white blood cells in his urine but  it was otherwise normal.  Given normal vital signs, no leukocytosis, and an unconvincing urinalysis, I advised that we should wait until the urine culture comes back before treating empirically and they are all comfortable with that plan.  There is no evidence of any acute or emergent medical condition at this time.  I encouraged them to continue the regular doses of his medications ncluding the newly increased Abilify and Klonopin until he can follow up with his psychiatrist.  I gave my usual and customary return precautions.  He is bradycardic but on the last emergency Department visit he had bradycardia at that time as well and there does not seem to be any acute change.  [CF]    Clinical Course User Index [CF] Hinda Kehr, MD    ____________________________________________  FINAL CLINICAL IMPRESSION(S) / ED DIAGNOSES  Final diagnoses:  Syncope, unspecified syncope type     MEDICATIONS GIVEN DURING THIS VISIT:  Medications - No data to display   NEW OUTPATIENT MEDICATIONS STARTED DURING THIS VISIT:  New Prescriptions   No medications on file    Modified Medications   No medications on file    Discontinued Medications   No medications on file     Note:  This document was prepared using Dragon voice recognition software and may include unintentional dictation errors.    Hinda Kehr, MD 02/05/17 [phone]

## 2017-02-05 NOTE — ED Notes (Signed)
Group home worker arrived. States he has a MOST form but it is not with his paperwork. Per worker they were pushing pt up ramp and noticed he was "out of it". They did a sternal rub and he did wake up. They laid him down and did the same and he awoke.

## 2017-02-05 NOTE — ED Triage Notes (Signed)
Pt was on an outing with his group home when they state he became unresponsive. On ems arrival pt was alert and his normal mental state. Pt was in a w/c at the time with no fall.

## 2017-02-07 LAB — URINE CULTURE: Special Requests: NORMAL

## 2017-03-13 IMAGING — CT CT ABD-PELV W/O CM
1 of 2 series · 14 of 32 positions shown, 18 images · non-contrast
Comparison: 03/20/2009 and plain films of 02/13/2014.

CLINICAL DATA: Altered mental status.  History of cerebral palsy.

EXAM:
CT ABDOMEN AND PELVIS WITHOUT CONTRAST
TECHNIQUE: Multidetector CT imaging of the abdomen and pelvis was performed
following the standard protocol without IV contrast.

[Series 2: routine abd pel without · axial · non-contrast · 0.60mm/px · z∈[-268,+96]mm · 14 of 84 slices shown, 18 images]
[im 7/84  soft-tissue]
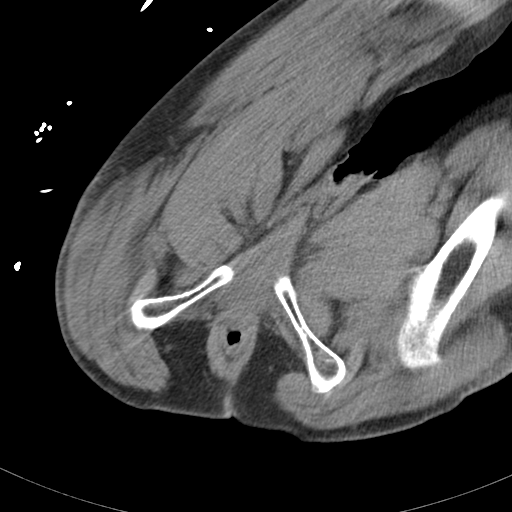
[im 7/84  bone]
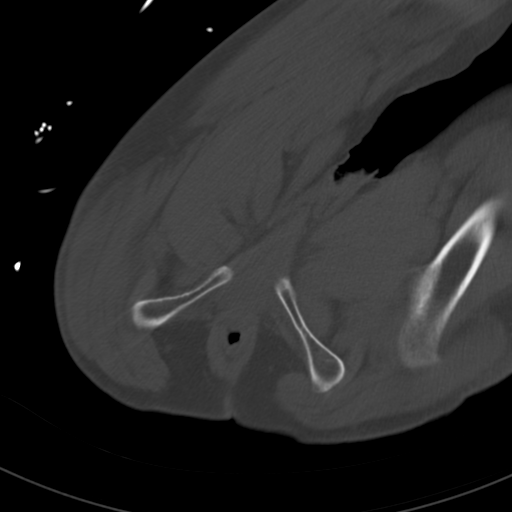
[im 13/84  soft-tissue]
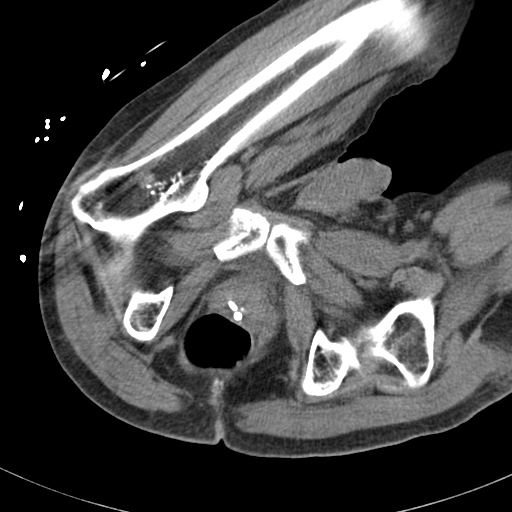
[im 20/84  soft-tissue]
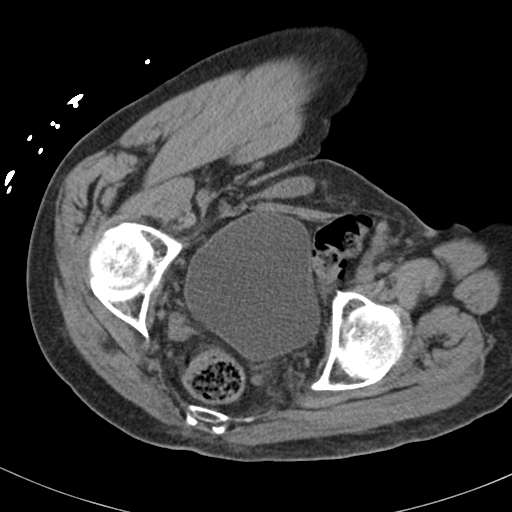
[im 26/84  soft-tissue]
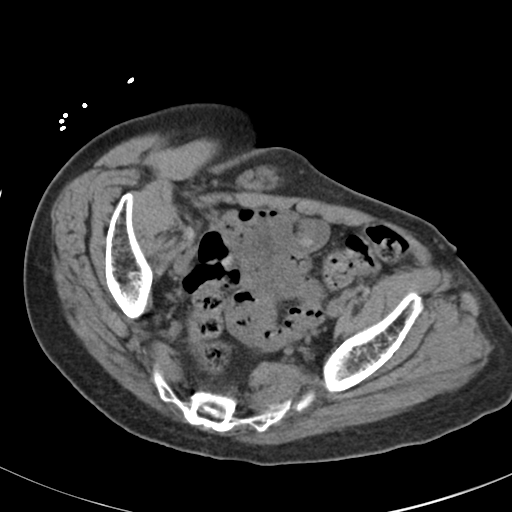
[im 32/84  soft-tissue]
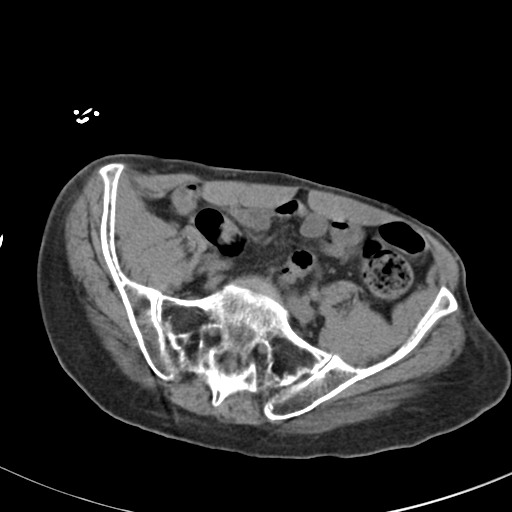
[im 39/84  soft-tissue]
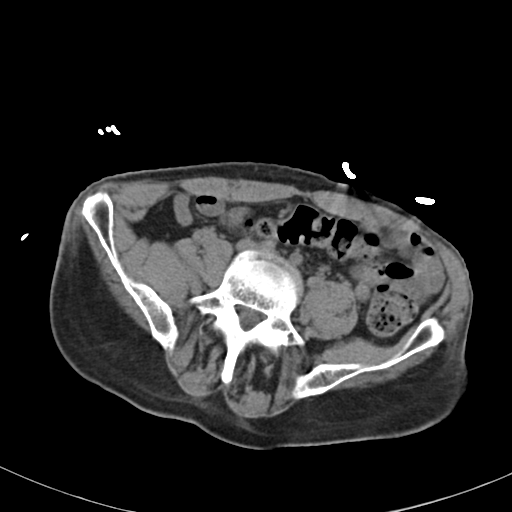
[im 45/84  soft-tissue]
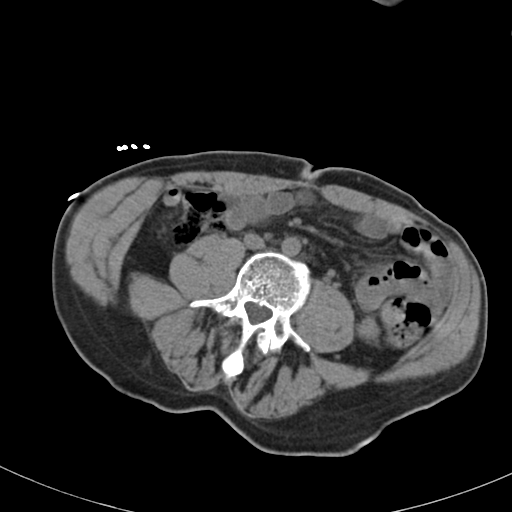
[im 52/84  soft-tissue]
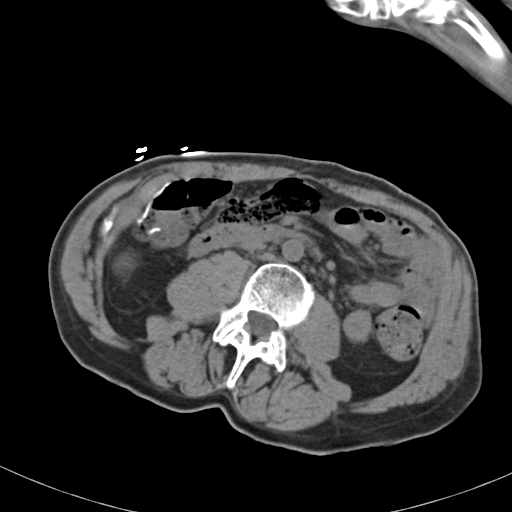
[im 58/84  soft-tissue]
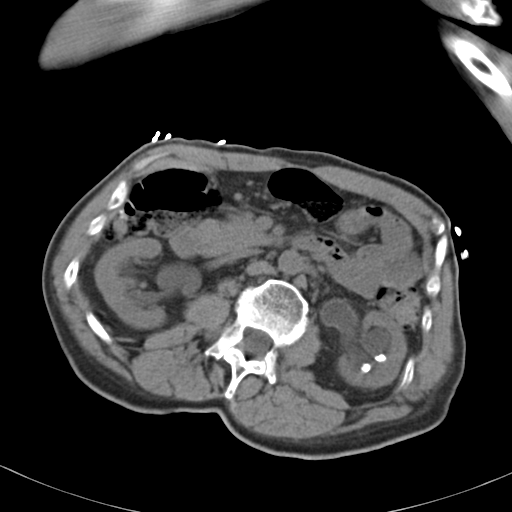
[im 58/84  bone]
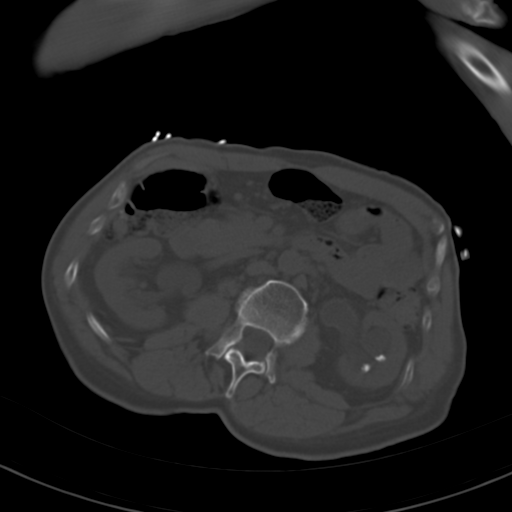
[im 64/84  soft-tissue]
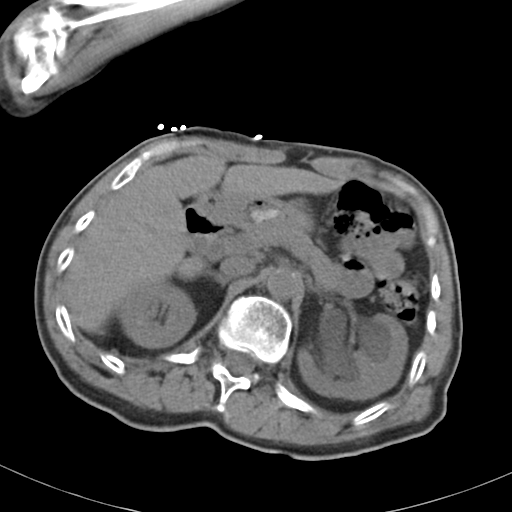
[im 71/84  soft-tissue]
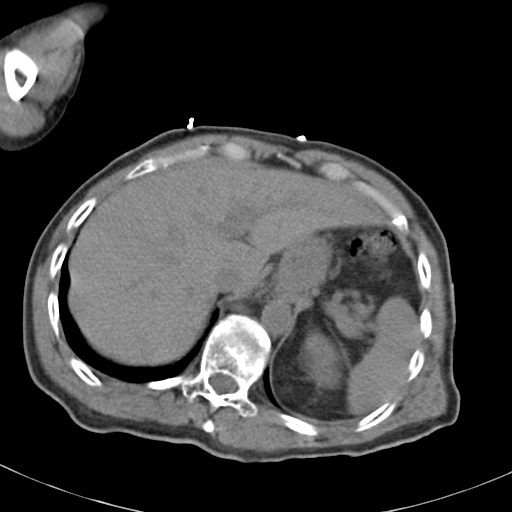
[im 71/84  lung]
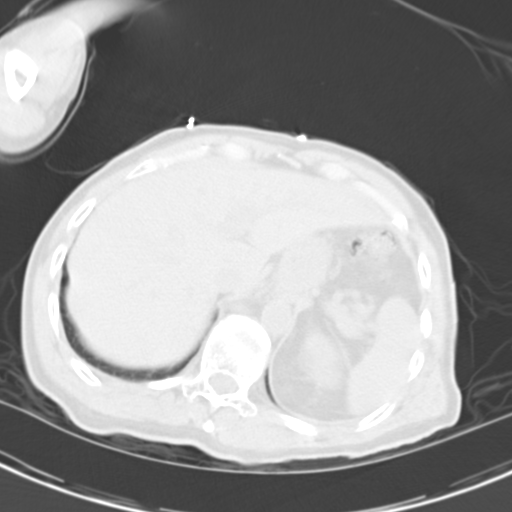
[im 74/84  lung]
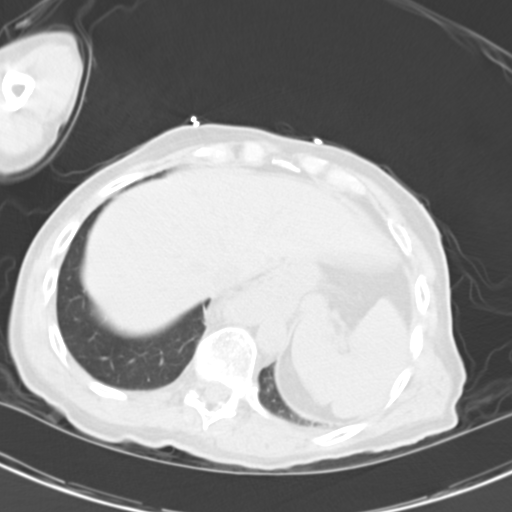
[im 77/84  soft-tissue]
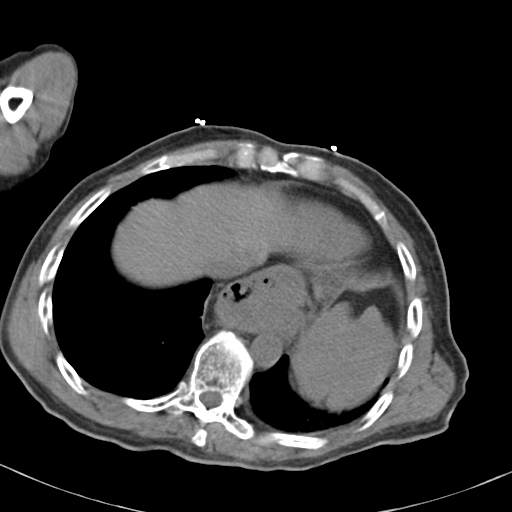
[im 77/84  lung]
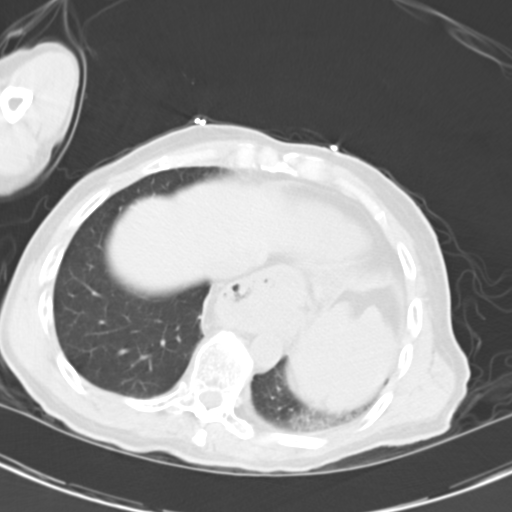
[im 80/84  lung]
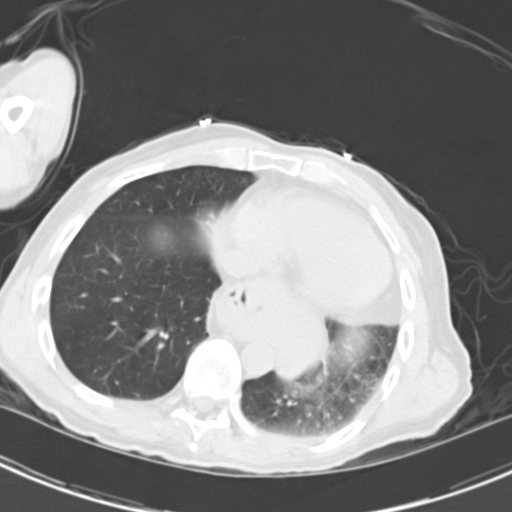

[14 of 32 positions shown; findings below may reference images not displayed]

FINDINGS: Lower chest: Left base volume loss. Mild left worse than right
gynecomastia. Mild cardiomegaly. A moderate hiatal hernia.

Hepatobiliary: Multifactorial degradation, including patient arm
position and overlying EKG lead artifact. Normal liver.
Cholecystectomy, without biliary ductal dilatation.

Pancreas: Normal, without mass or ductal dilatation.

Spleen: Normal

Adrenals/Urinary Tract: Normal adrenal glands. Right extrarenal
pelvis. An upper pole left renal lesion is most apparent on coronal
image 82. Measures 7 mm and approximately 56 HU. Not readily
apparent on the prior. Multiple left renal collecting system
calculi, on the order of 8 mm or less. Bilateral renal cortical
thinning is mild. Mild to moderate left-sided hydronephrosis
secondary to a 4 mm stone at the left ureteropelvic junction. No
distal ureteric stones. No bladder calculi.

Stomach/Bowel: Normal distal stomach. Colonic stool burden suggests
constipation. Surgical site sugars in the region of the ileocecal
junction.

Vascular/Lymphatic: Normal caliber of the aorta and branch vessels.
No abdominopelvic adenopathy.

Reproductive: Prostatic calcifications.

Other: No significant free fluid.

Musculoskeletal: Osteopenia. Moderate convex left lumbar spine
curvature.
IMPRESSION: 1. Mild to moderate left-sided hydroureteronephrosis secondary to a
4 mm left ureteropelvic junction stone.
2. Otherwise, multifactorial degradation as detailed above.
3. Left nephrolithiasis with bilateral renal cortical thinning.
4. 7 mm upper pole left renal lesion which is indeterminate. Given
mild hyperattenuation, a hemorrhagic cyst is favored. Although the
most definitive characterization would be with pre and post contrast
abdominal MRI, given patient's clinical status, ultrasound followup
at 1 year is a suggested strategy.
5. Moderate hiatal hernia.
6. Bilateral gynecomastia.
7.  Possible constipation.

## 2017-03-13 IMAGING — CT CT HEAD W/O CM
3 series · 17 of 30 positions shown, 19 images · non-contrast
Comparison: 12/08/2009

CLINICAL DATA: Altered mental status, and decreased alertness in
activity, not eating, history cerebral palsy, deafness, blindness

EXAM:
CT HEAD WITHOUT CONTRAST
TECHNIQUE: Contiguous axial images were obtained from the base of the skull
through the vertex without intravenous contrast.

[Series 2: soft tissue · axial · 0.40mm/px · z∈[+331,+436]mm · 8 of 27 slices shown]
[im 3/27  brain]
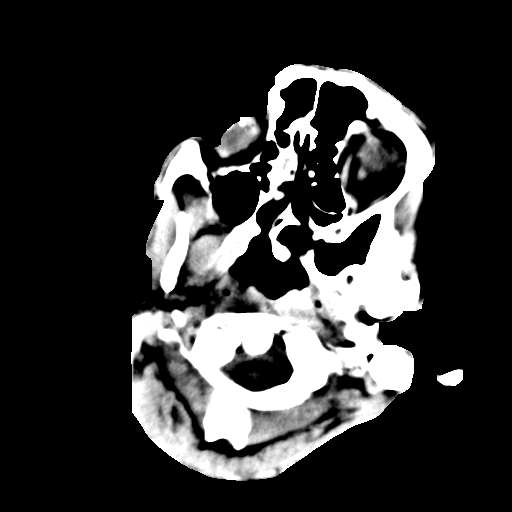
[im 6/27  brain]
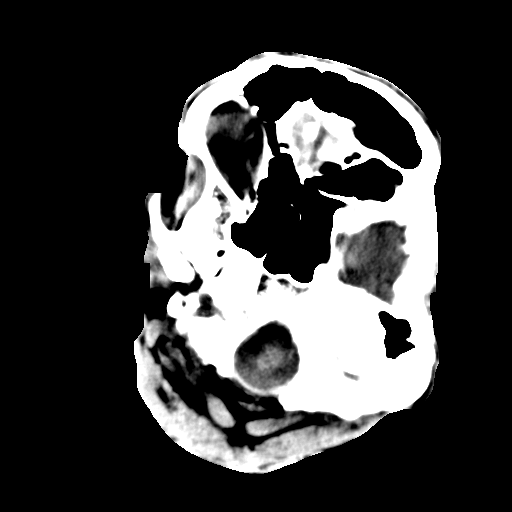
[im 9/27  brain]
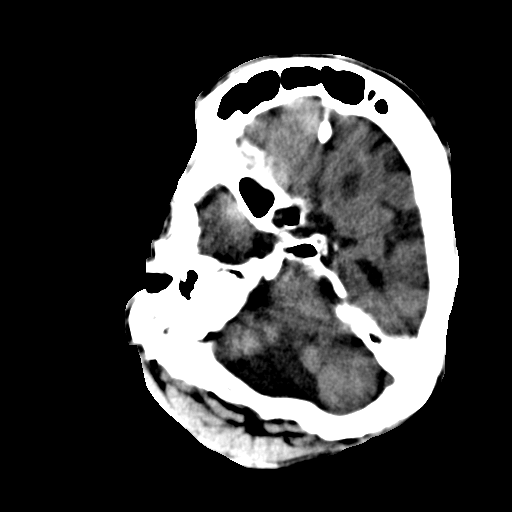
[im 12/27  brain]
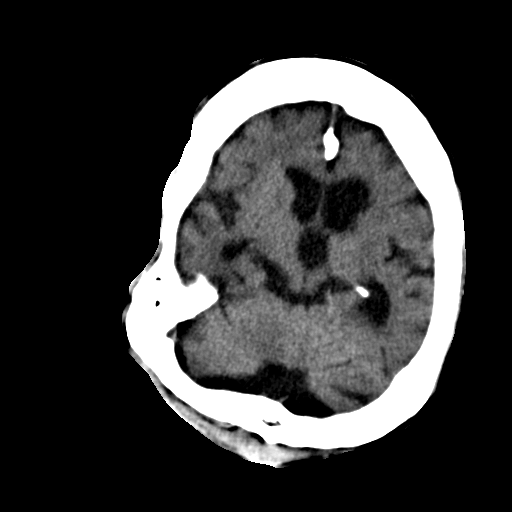
[im 15/27  brain]
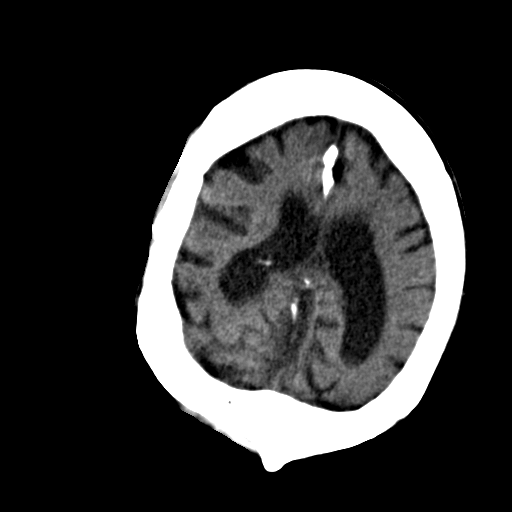
[im 18/27  brain]
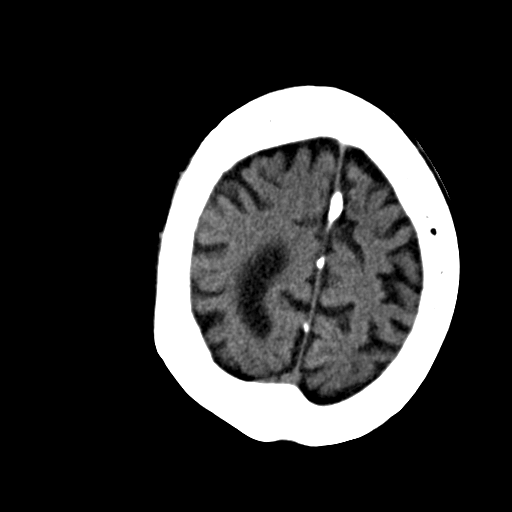
[im 21/27  brain]
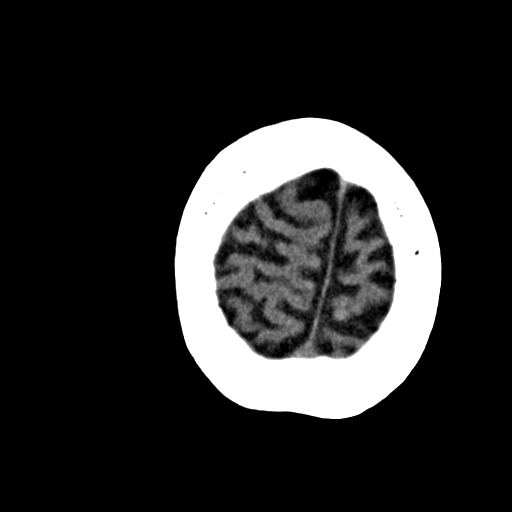
[im 24/27  brain]
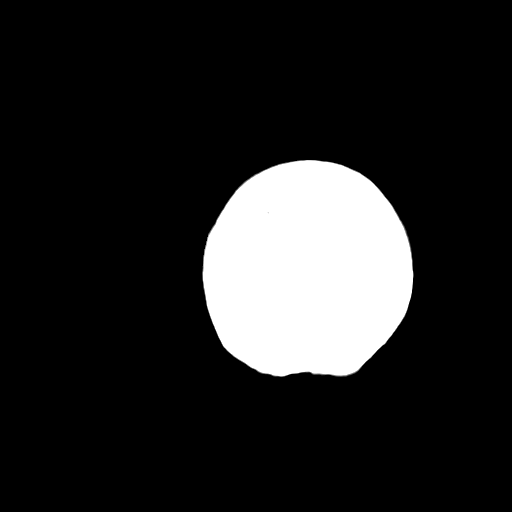

[Series 4: soft tissue recon · axial · 0.42mm/px · z∈[+369,+474]mm · 8 of 30 slices shown, 10 images]
[im 4/30  brain]
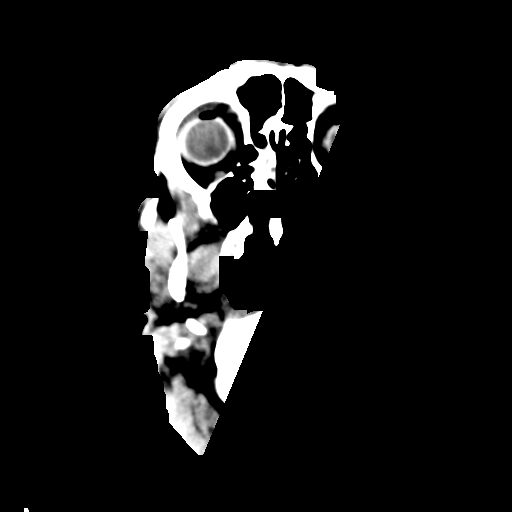
[im 4/30  bone]
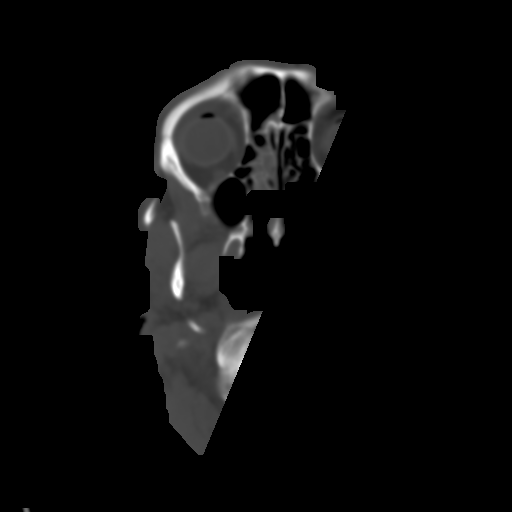
[im 7/30  brain]
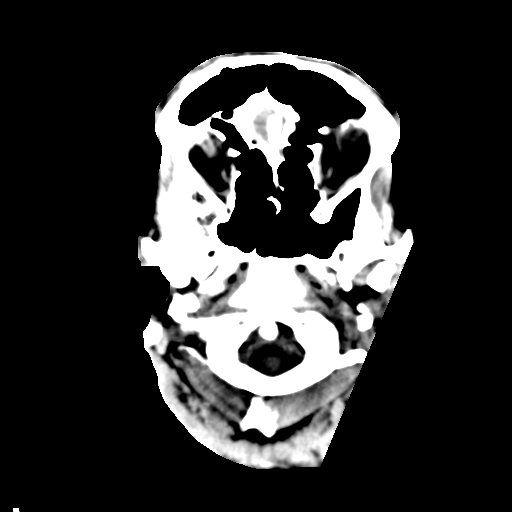
[im 10/30  brain]
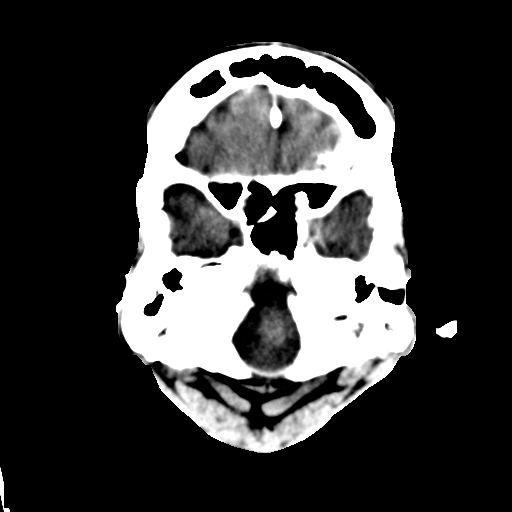
[im 13/30  brain]
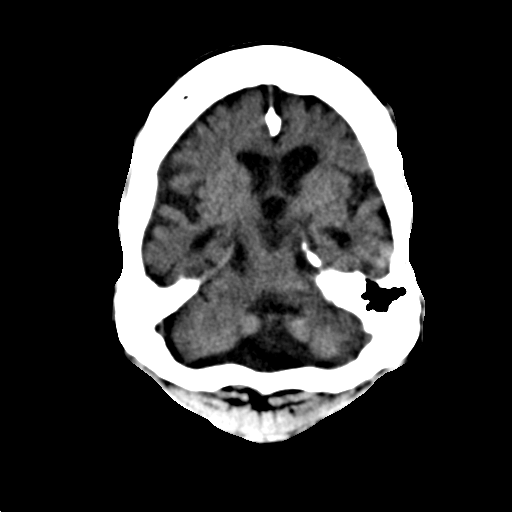
[im 17/30  brain]
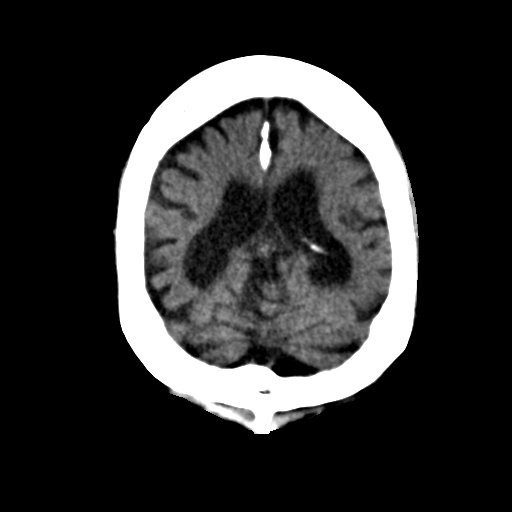
[im 17/30  bone]
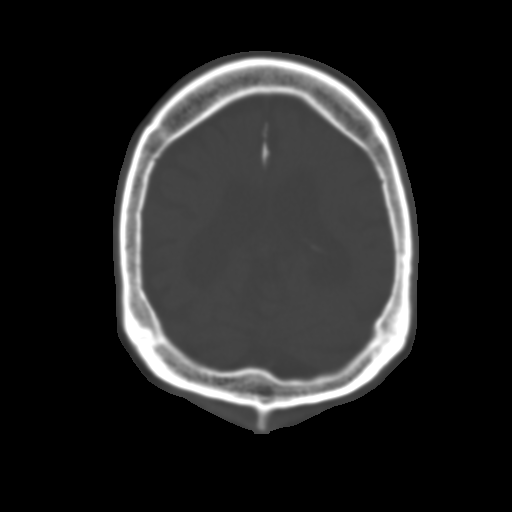
[im 20/30  brain]
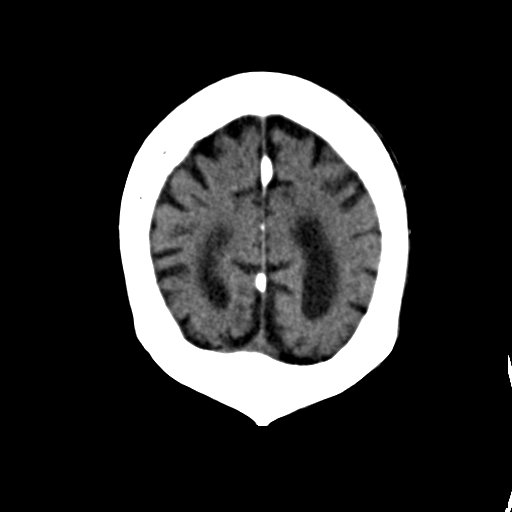
[im 23/30  brain]
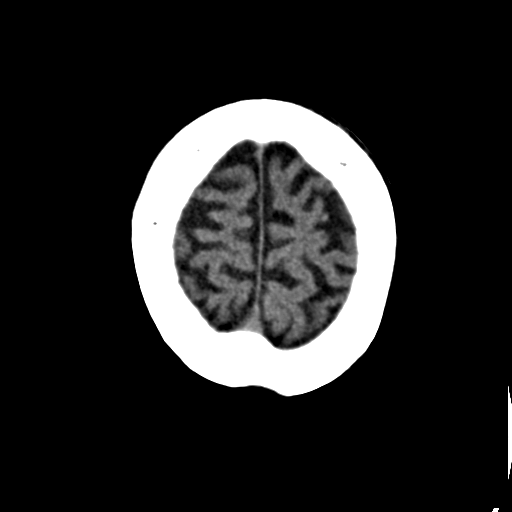
[im 26/30  brain]
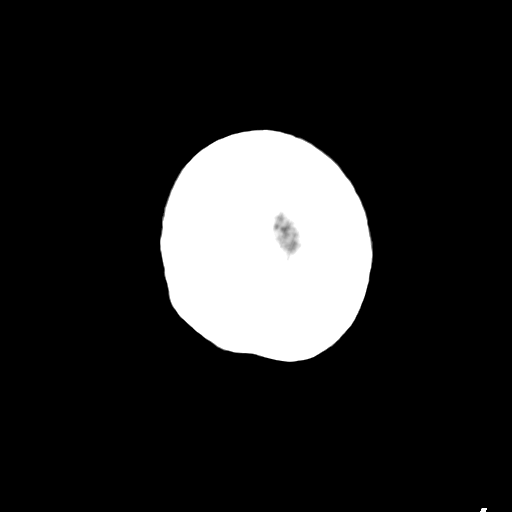

[Series 5: bone recon · axial · 0.30mm/px · 1 of 30 slices shown]
[im 4/30  bone]
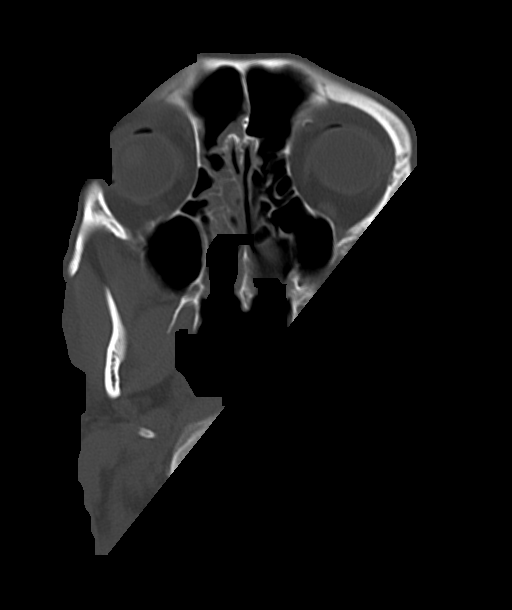

[17 of 30 positions shown; findings below may reference images not displayed]

FINDINGS: Generalized atrophy.

Ex vacuo dilatation of the ventricular system, stable, consistent
with degree of atrophy.

No midline shift or mass effect.

Stable appearance of brain parenchyma.

No evidence of mass lesion, intracranial hemorrhage or acute
infarction.

Scattered dural calcifications in falx.

Partial opacification of RIGHT ethmoid air cells.

No acute osseous or sinus abnormalities otherwise seen.
IMPRESSION: Chronic atrophy and ventriculomegaly, stable.

No acute abnormalities.

## 2017-03-13 IMAGING — CR DG CHEST 1V PORT
1 series · 1 of 1 positions shown · non-contrast
Comparison: December 08, 2009

CLINICAL DATA: Altered mental status/confusion

EXAM:
PORTABLE CHEST - 1 VIEW

[chest ap]
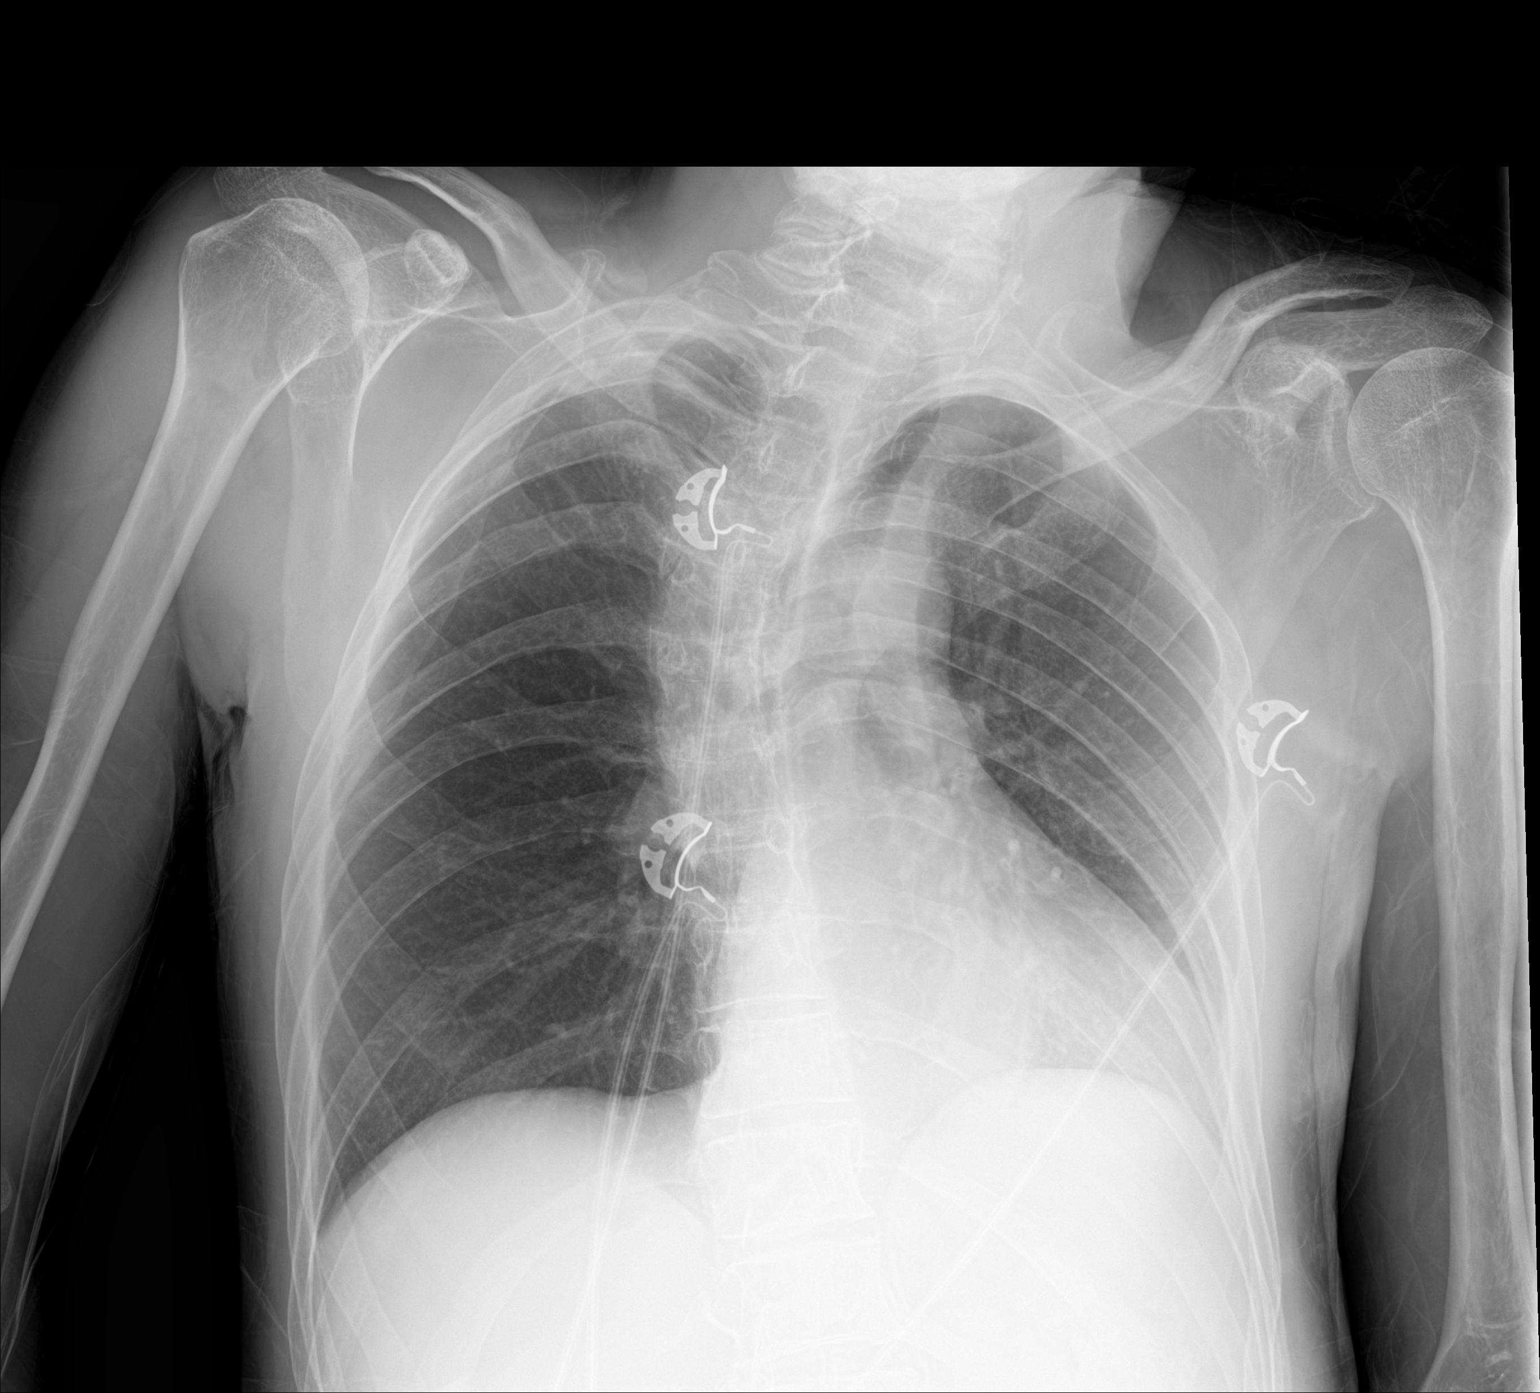

[1 of 1 positions shown; findings below may reference images not displayed]

FINDINGS: There is focal airspace consolidation in the left base. Lungs
elsewhere clear. Heart size and pulmonary vascularity are normal. No
adenopathy. There is upper thoracic levoscoliosis.
IMPRESSION: Left base airspace consolidation consistent with pneumonia. Lungs
otherwise clear. Followup PA and lateral chest radiographs
recommended in 3-4 weeks following trial of antibiotic therapy to
ensure resolution and exclude underlying malignancy.

## 2017-07-10 ENCOUNTER — Other Ambulatory Visit: Payer: Self-pay

## 2017-08-06 ENCOUNTER — Telehealth: Payer: Self-pay | Admitting: Urology

## 2017-08-06 DIAGNOSIS — Z87442 Personal history of urinary calculi: Secondary | ICD-10-CM

## 2017-08-06 NOTE — Telephone Encounter (Signed)
Received a call from the patient's group home and they stated that you were treating him at [facility] for stones and that they think he may have one now. They asked if he could go ahead and get a KUB prior to his app with you on the 13th? I told them I didn't think that would be a problem, can you put an order in for one please and thank you?  Sharyn Lull

## 2017-08-07 NOTE — Telephone Encounter (Signed)
KUB order was entered

## 2017-08-12 ENCOUNTER — Ambulatory Visit
Admission: RE | Admit: 2017-08-12 | Discharge: 2017-08-12 | Disposition: A | Discharge status: Home / Self Care | Payer: Medicare Other | Source: Ambulatory Visit | Attending: Urology | Admitting: Urology

## 2017-08-12 ENCOUNTER — Ambulatory Visit (INDEPENDENT_AMBULATORY_CARE_PROVIDER_SITE_OTHER): Payer: Medicare Other | Admitting: Urology

## 2017-08-12 ENCOUNTER — Encounter: Payer: Self-pay | Admitting: Urology

## 2017-08-12 VITALS — Ht 59.0 in | Wt <= 1120 oz

## 2017-08-12 DIAGNOSIS — N200 Calculus of kidney: Secondary | ICD-10-CM

## 2017-08-12 DIAGNOSIS — D649 Anemia, unspecified: Secondary | ICD-10-CM | POA: Insufficient documentation

## 2017-08-12 DIAGNOSIS — Z87442 Personal history of urinary calculi: Secondary | ICD-10-CM

## 2017-08-12 DIAGNOSIS — F39 Unspecified mood [affective] disorder: Secondary | ICD-10-CM | POA: Insufficient documentation

## 2017-08-13 ENCOUNTER — Emergency Department: Payer: Medicare Other

## 2017-08-13 ENCOUNTER — Emergency Department
Admission: EM | Admit: 2017-08-13 | Discharge: 2017-08-13 | Payer: Medicare Other | Attending: Emergency Medicine | Admitting: Emergency Medicine

## 2017-08-13 ENCOUNTER — Encounter: Payer: Self-pay | Admitting: Urology

## 2017-08-13 DIAGNOSIS — I10 Essential (primary) hypertension: Secondary | ICD-10-CM | POA: Diagnosis not present

## 2017-08-13 DIAGNOSIS — G809 Cerebral palsy, unspecified: Secondary | ICD-10-CM | POA: Insufficient documentation

## 2017-08-13 DIAGNOSIS — H905 Unspecified sensorineural hearing loss: Secondary | ICD-10-CM | POA: Diagnosis not present

## 2017-08-13 DIAGNOSIS — J069 Acute upper respiratory infection, unspecified: Secondary | ICD-10-CM | POA: Insufficient documentation

## 2017-08-13 DIAGNOSIS — R0981 Nasal congestion: Secondary | ICD-10-CM | POA: Diagnosis present

## 2017-08-13 DIAGNOSIS — Z79899 Other long term (current) drug therapy: Secondary | ICD-10-CM | POA: Diagnosis not present

## 2017-08-13 DIAGNOSIS — R0602 Shortness of breath: Secondary | ICD-10-CM | POA: Diagnosis not present

## 2017-08-13 LAB — CBC WITH DIFFERENTIAL/PLATELET
BASOS PCT: 0 %
Basophils Absolute: 0 10*3/uL (ref 0–0.1)
EOS ABS: 0 10*3/uL (ref 0–0.7)
Eosinophils Relative: 1 %
HCT: 40.4 % (ref 40.0–52.0)
Hemoglobin: 13.6 g/dL (ref 13.0–18.0)
Lymphocytes Relative: 6 %
Lymphs Abs: 0.5 10*3/uL — ABNORMAL LOW (ref 1.0–3.6)
MCH: 31.8 pg (ref 26.0–34.0)
MCHC: 33.6 g/dL (ref 32.0–36.0)
MCV: 94.5 fL (ref 80.0–100.0)
MONO ABS: 0.7 10*3/uL (ref 0.2–1.0)
MONOS PCT: 8 %
Neutro Abs: 7.4 10*3/uL — ABNORMAL HIGH (ref 1.4–6.5)
Neutrophils Relative %: 85 %
Platelets: 144 10*3/uL — ABNORMAL LOW (ref 150–440)
RBC: 4.27 MIL/uL — ABNORMAL LOW (ref 4.40–5.90)
RDW: 13.3 % (ref 11.5–14.5)
WBC: 8.7 10*3/uL (ref 3.8–10.6)

## 2017-08-13 LAB — BASIC METABOLIC PANEL
Anion gap: 9 (ref 5–15)
BUN: 26 mg/dL — ABNORMAL HIGH (ref 6–20)
CALCIUM: 8.8 mg/dL — AB (ref 8.9–10.3)
CO2: 30 mmol/L (ref 22–32)
CREATININE: 0.85 mg/dL (ref 0.61–1.24)
Chloride: 106 mmol/L (ref 101–111)
GFR calc non Af Amer: >60 mL/min (ref >60)
Glucose, Bld: 95 mg/dL (ref 65–99)
Potassium: 3.6 mmol/L (ref 3.5–5.1)
SODIUM: 145 mmol/L (ref 135–145)

## 2017-08-13 MED ORDER — AZITHROMYCIN 500 MG PO TABS
500.0000 mg | ORAL_TABLET | Freq: Once | ORAL | Status: AC
  Administered 2017-08-13: 500 mg via ORAL
  Filled 2017-08-13: qty 1

## 2017-08-13 MED ORDER — AZITHROMYCIN 250 MG PO TABS: ORAL_TABLET | ORAL | 0 refills | Status: AC

## 2017-08-13 MED ORDER — FLUTICASONE PROPIONATE 50 MCG/ACT NA SUSP: 2.0000 | Freq: Every day | NASAL | 0 refills | Status: DC

## 2017-08-13 NOTE — ED Provider Notes (Addendum)
[facility] Emergency Department Provider Note  ___________________________________________   First MD Initiated Contact with Patient 08/13/17 1629     (approximate)  I have reviewed the triage vital signs and the nursing notes.   HISTORY  Chief Complaint Nasal Congestion and Shortness of Breath   HPI Shawn Sweeney is a 62 y.o. 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  The patient has been on Mucinex.  Found to be hypoxic by EMS prior to arrival with a documented oxygen saturation in the 70s.  Placed on nasal cannula at that time.  EMS reported that his saturation resolved to 96% on 2 L nasal cannula.  Nonverbal and unable to give further history at this time.   Past Medical History:  Diagnosis Date  . Abdominal wall hernia   . 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)   . Anemia   . Aortic atherosclerosis ([place]) 04/11/2016  . Blind   . BPH (benign prostatic hyperplasia)   . Calculus of gallbladder with acute cholecystitis and obstruction   . Cerebral palsy ([place])   . Congenital deafness   . Dermatophytosis of nail   . Elevated PSA   . GERD (gastroesophageal reflux disease) 01/16/2017  . Hypertension   . Lower extremity edema    echo with normal left and right heart fucntion, mild MR  . Mood disorder ([place])   . Nephrolithiasis   . Onychomycosis   . Phimosis   . Pneumonia 12/18/2014    Patient Active Problem List   Diagnosis Date Noted  . Mood disorder ([place]) 08/12/2017  . Anemia, unspecified 08/12/2017  . UTI (urinary tract infection) 01/16/2017  . HTN (hypertension) 01/16/2017  . BPH (benign prostatic hyperplasia) 01/16/2017  . GERD (gastroesophageal reflux disease) 01/16/2017  . Aortic atherosclerosis ([place]) 04/11/2016  . Choking 02/13/2016  . Bilateral inguinal hernia without obstruction or gangrene 01/14/2016  . Abnormal loss of weight  12/06/2015  . Pharyngoesophageal dysphagia 08/21/2015  . History of colon cancer 08/21/2015  . Pneumonia 12/18/2014  . Hypotension 12/15/2014  . Bradycardia 12/15/2014  . Cerebral palsy ([place]) 12/15/2014  . 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 Blindness 12/15/2014  . Blindness of both eyes 12/15/2014  . Altered mental status 12/14/2014  . Hydronephrosis, left 12/14/2014  . Nephrolithiasis 12/14/2014    Class: Chronic  . Onychomycosis 09/22/2014  . Localized edema 09/22/2014  . Adenocarcinoma of cecum ([place]) 09/22/2014  . Abdominal wall hernia 07/06/2014  . Elevated prostate specific antigen (PSA) 06/15/2012    Past Surgical History:  Procedure Laterality Date  . CHOLECYSTECTOMY    . COLECTOMY    . COLON SURGERY    . COLONOSCOPY    . COLONOSCOPY WITH PROPOFOL N/A 10/08/2015   Procedure: COLONOSCOPY WITH PROPOFOL;  Surgeon: Hulen Luster, MD;  Location: [facility] ENDOSCOPY;  Service: Gastroenterology;  Laterality: N/A;  . DIAGNOSTIC LAPAROSCOPY    . ESOPHAGOGASTRODUODENOSCOPY    . ESOPHAGOGASTRODUODENOSCOPY (EGD) WITH PROPOFOL N/A 10/08/2015   Procedure: ESOPHAGOGASTRODUODENOSCOPY (EGD) WITH PROPOFOL;  Surgeon: Hulen Luster, MD;  Location: [facility] ENDOSCOPY;  Service: Gastroenterology;  Laterality: N/A;    Prior to Admission medications   Medication Sig Start Date End Date Taking? Authorizing Provider  ARIPiprazole (ABILIFY) 2 MG tablet Take 3 mg by mouth daily. Take 1 and a 1/2 tablets by mouth daily.    [provider]  clonazePAM (KLONOPIN) 0.5 MG tablet Take 0.5 mg by  mouth 2 (two) times daily.  06/15/12   [provider]  docusate sodium (COLACE) 100 MG capsule Take 100 mg by mouth 2 (two) times daily.    [provider]  fexofenadine (ALLEGRA) 180 MG tablet Take 180 mg by mouth daily as needed for allergies or rhinitis.    [provider]  FINASTERIDE PO Take 5 mg by mouth daily.    [provider]  FLUoxetine (PROZAC) 20 MG capsule Take 20 mg by  mouth every morning.     [provider]  fluticasone (FLONASE) 50 MCG/ACT nasal spray Place 2 sprays into both nostrils at bedtime. 06/15/12   [provider]  furosemide (LASIX) 20 MG tablet Take 20 mg by mouth daily as needed for edema.     [provider]  guaiFENesin (ROBITUSSIN) 100 MG/5ML liquid Take 100 mg by mouth every 6 (six) hours as needed for cough.    [provider]  hydrocortisone (ANUSOL-HC) 25 MG suppository Place 25 mg rectally every 12 (twelve) hours as needed (rectal bleeding).     [provider]  memantine (NAMENDA XR) 28 MG CP24 24 hr capsule Take 28 mg by mouth daily.    [provider]  Multiple Vitamin (MULTI-VITAMINS) TABS Take 1 tablet by mouth daily.    [provider]  omeprazole (PRILOSEC) 20 MG capsule Take 20 mg by mouth 2 (two) times daily.    [provider]  Phenylephrine-DM-GG (MUCINEX FAST-MAX CONGEST COUGH) 2.5-5-100 MG/5ML LIQD Take 5 mLs by mouth every 6 (six) hours as needed (cough and congestion).    [provider]  polyethylene glycol (MIRALAX / GLYCOLAX) packet Take 17 g by mouth daily.    [provider]  potassium chloride (K-DUR) 10 MEQ tablet Take 10 mEq by mouth daily as needed (edema). Take with lasix    [provider]  rivastigmine (EXELON) 9.5 mg/24hr Place 9.5 mg onto the skin daily.    [provider]  sucralfate (CARAFATE) 1 GM/10ML suspension Take 1 g by mouth 2 (two) times daily before a meal.    [provider]  tamsulosin (FLOMAX) 0.4 MG CAPS capsule Take 1 capsule by mouth daily. 06/15/12   [provider]  traMADol (ULTRAM) 50 MG tablet Take by mouth every 6 (six) hours as needed for moderate pain.    [provider]    Allergies Risperidone and Levofloxacin  Family History  Problem Relation Age of Onset  . CAD Father   . Prostate cancer Father   . Colon cancer Father     Social  History Social History   Tobacco Use  . Smoking status: Never Smoker  . Smokeless tobacco: Never Used  Substance Use Topics  . Alcohol use: No  . Drug use: No    Review of Systems  Level 5 caveat secondary to patient noncommunicative.   ____________________________________________   PHYSICAL EXAM:  VITAL SIGNS: ED Triage Vitals  Enc Vitals Group     BP 08/13/17 1611 120/87     Pulse Rate 08/13/17 1611 77     Resp --      Temp 08/13/17 1611 98.2 F (36.8 C)     Temp Source 08/13/17 1611 Oral     SpO2 08/13/17 1611 95 %     Weight 08/13/17 1625 69 lb (31.3 kg)     Height --      Head Circumference --      Peak Flow --      Pain  Score --      Pain Loc --      Pain Edu? --      Excl. in [place]? --     Constitutional: Alert and oriented. Well appearing and in no acute distress. Eyes: Conjunctivae are normal.  Head: Atraumatic. Nose: No congestion/rhinnorhea. Mouth/Throat: Mucous membranes are moist.  Neck: No stridor.   Cardiovascular: Normal rate, regular rhythm. Grossly normal heart sounds.   Respiratory: Normal respiratory effort.  No retractions. Lungs CTAB. Gastrointestinal: Soft and nontender. No distention. Musculoskeletal: No lower extremity tenderness nor edema.  No joint effusions. Neurologic:  No gross focal neurologic deficits are appreciated. Skin:  Skin is warm, dry and intact. No rash noted.   ____________________________________________   LABS (all labs ordered are listed, but only abnormal results are displayed)  Labs Reviewed  CBC WITH DIFFERENTIAL/PLATELET - Abnormal; Notable for the following components:      Result Value   RBC 4.27 (*)    Platelets 144 (*)    Neutro Abs 7.4 (*)    Lymphs Abs 0.5 (*)    All other components within normal limits  BASIC METABOLIC PANEL - Abnormal; Notable for the following components:   BUN 26 (*)    Calcium 8.8 (*)    All other components within normal limits  BLOOD GAS, VENOUS - Abnormal; Notable for  the following components:   pCO2, Ven 64 (*)    Bicarbonate 32.2 (*)    Acid-Base Excess 3.9 (*)    All other components within normal limits   ____________________________________________  EKG   ____________________________________________  RADIOLOGY  No acute finding on the chest x-ray. ____________________________________________   PROCEDURES  Procedure(s) performed:   Procedures  Critical Care performed:   ____________________________________________   INITIAL IMPRESSION / ASSESSMENT AND PLAN / ED COURSE  Pertinent labs & imaging results that were available during my care of the patient were reviewed by me and considered in my medical decision making (see chart for details).  Differential includes, but is not limited to, viral syndrome, bronchitis including COPD exacerbation, pneumonia, reactive airway disease including asthma, CHF including exacerbation with or without pulmonary/interstitial edema, pneumothorax, ACS, thoracic trauma, and pulmonary embolism. As part of my medical decision making, I reviewed the following data within the electronic MEDICAL RECORD NUMBER Notes from prior ED visits  ----------------------------------------- 5:28 PM on 08/13/2017 -----------------------------------------  Family is now at the bedside as well as caregiver.  The caregiver says that she has had the patient on Mucinex since this past Monday.  Says that she took a pulse ox today and found to be in the 70s but this was on a very cold finger.  Family as well as the caregiver reports that the patient has been spitting up thick phlegm.  They deny runny nose or fever.    ----------------------------------------- 6:51 PM on 08/13/2017 -----------------------------------------  Patient at this time on room air is 97-100%.  Elevated PCO2 on the VBG but patient at baseline mental status.  No wheezing.  Unclear significance.  Patient will be discharged with Z-Pak.  Because the limitation  of the history and likely high risk for infection we will cover for bacterial pathogens.  We will also write a prescription for Flonase.  Family understands the diagnosis as well as treatment plan willing to comply.  Patient also will be following up tomorrow with his primary care doctor.  ____________________________________________   FINAL CLINICAL IMPRESSION(S) / ED DIAGNOSES  Upper respiratory infection.    NEW MEDICATIONS STARTED DURING THIS  VISIT:  New Prescriptions   No medications on file     Note:  This document was prepared using Dragon voice recognition software and may include unintentional dictation errors.     Orbie Pyo, MD 08/13/17 [id number]  ED ECG REPORT I, Doran Stabler, the attending physician, personally viewed and interpreted this ECG.   Date: 08/22/2017  EKG Time: 1250  Rate: 50  Rhythm: normal sinus rhythm  Axis: Normal  Intervals:none  ST&T Change: No ST segment elevation or depression.  No abnormal T wave inversion.     Orbie Pyo, MD 08/22/17 [phone]

## 2017-08-13 NOTE — ED Triage Notes (Signed)
Pt presents today via [facility] from [facility]. Pt is blind and deaf, and caregiver states pt has been [facility] with decreased O2. [facility] states O2 sat was 90-90 on arrival and placed pt on 2 L [place] where Pt O2 currently at 96%. [facility] states they were told pt has been on Mucinex with not relief from the congestion.

## 2017-08-13 NOTE — Progress Notes (Signed)
08/12/2017 7:51 AM   [name] [birth year] [id number]  Referring provider: Adin Hector, MD [facility] [place], [place] [address]  Chief Complaint  Patient presents with  . Nephrolithiasis    follow up     HPI: 62 year old male with cerebral palsy and recurrent nephrolithiasis.  I last saw him at [facility] and March 2018.  A prior CT in 2017 and showed a small left renal calculus.  He has had a recent weight loss from 82 down to 69 pounds and they wanted to make sure this is not due to a stone.  He has an appointment with Dr. Caryl Comes in the near future for evaluation of his weight loss. No fevers or apparent pain.  KUB performed today was reviewed and there is a an approximately 6 mm calcification overlying the left renal outline which could potentially represent a nonobstructing stone.  Significant stool present in the colon.   PMH: Past Medical History:  Diagnosis Date  . Abdominal wall hernia   . 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 Anemia   . Aortic atherosclerosis ([place]) 04/11/2016  . Blind   . BPH (benign prostatic hyperplasia)   . Calculus of gallbladder with acute cholecystitis and obstruction   . Cerebral palsy ([place])   . Congenital deafness   . Dermatophytosis of nail   . Elevated PSA   . GERD (gastroesophageal reflux disease) 01/16/2017  . Hypertension   . Lower extremity edema    echo with normal left and right heart fucntion, mild MR  . Mood disorder ([place])   . Nephrolithiasis   . Onychomycosis   . Phimosis   . Pneumonia 12/18/2014    Surgical History: Past Surgical History:  Procedure Laterality Date  . CHOLECYSTECTOMY    . COLECTOMY    . COLON SURGERY    . COLONOSCOPY    . COLONOSCOPY WITH PROPOFOL N/A 10/08/2015   Procedure: COLONOSCOPY WITH PROPOFOL;  Surgeon: Hulen Luster, MD;  Location: [facility] ENDOSCOPY;  Service: Gastroenterology;  Laterality: N/A;  . DIAGNOSTIC LAPAROSCOPY    . ESOPHAGOGASTRODUODENOSCOPY    .  ESOPHAGOGASTRODUODENOSCOPY (EGD) WITH PROPOFOL N/A 10/08/2015   Procedure: ESOPHAGOGASTRODUODENOSCOPY (EGD) WITH PROPOFOL;  Surgeon: Hulen Luster, MD;  Location: [facility] ENDOSCOPY;  Service: Gastroenterology;  Laterality: N/A;    Home Medications:  Allergies as of 08/12/2017      Reactions   Risperidone Hives, Other (See Comments)   Levofloxacin Other (See Comments), Rash      Medication List        Accurate as of 08/12/17 11:59 PM. Always use your most recent med list.          ABILIFY 2 MG tablet Generic drug:  ARIPiprazole Take 3 mg by mouth daily. Take 1 and a 1/2 tablets by mouth daily.   docusate sodium 100 MG capsule Commonly known as:  COLACE Take 100 mg by mouth 2 (two) times daily.   fexofenadine 180 MG tablet Commonly known as:  ALLEGRA Take 180 mg by mouth daily as needed for allergies or rhinitis.   FINASTERIDE PO Take 5 mg by mouth daily.   FLOMAX 0.4 MG Caps capsule Generic drug:  tamsulosin Take 1 capsule by mouth daily.   FLONASE 50 MCG/ACT nasal spray Generic drug:  fluticasone Place 2 sprays into both nostrils at bedtime.   FLUoxetine 20 MG capsule Commonly known as:  PROZAC Take 20 mg by mouth every morning.   furosemide 20 MG tablet Commonly  known as:  LASIX Take 20 mg by mouth daily as needed for edema.   guaiFENesin 100 MG/5ML liquid Commonly known as:  ROBITUSSIN Take 100 mg by mouth every 6 (six) hours as needed for cough.   hydrocortisone 25 MG suppository Commonly known as:  ANUSOL-HC Place 25 mg rectally every 12 (twelve) hours as needed (rectal bleeding).   KLONOPIN 0.5 MG tablet Generic drug:  clonazePAM Take 0.5 mg by mouth 2 (two) times daily.   MUCINEX FAST-MAX CONGEST COUGH 2.5-5-100 MG/5ML Liqd Generic drug:  Phenylephrine-DM-GG Take 5 mLs by mouth every 6 (six) hours as needed (cough and congestion).   MULTI-VITAMINS Tabs Take 1 tablet by mouth daily.   NAMENDA XR 28 MG Cp24 24 hr capsule Generic drug:  memantine Take  28 mg by mouth daily.   omeprazole 20 MG capsule Commonly known as:  PRILOSEC Take 20 mg by mouth 2 (two) times daily.   polyethylene glycol packet Commonly known as:  MIRALAX / GLYCOLAX Take 17 g by mouth daily.   potassium chloride 10 MEQ tablet Commonly known as:  K-DUR Take 10 mEq by mouth daily as needed (edema). Take with lasix   rivastigmine 9.5 mg/24hr Commonly known as:  EXELON Place 9.5 mg onto the skin daily.   sucralfate 1 GM/10ML suspension Commonly known as:  CARAFATE Take 1 g by mouth 2 (two) times daily before a meal.   traMADol 50 MG tablet Commonly known as:  ULTRAM Take by mouth every 6 (six) hours as needed for moderate pain.       Allergies:  Allergies  Allergen Reactions  . Risperidone Hives and Other (See Comments)  . Levofloxacin Other (See Comments) and Rash    Family History: Family History  Problem Relation Age of Onset  . CAD Father   . Prostate cancer Father   . Colon cancer Father     Social History:  reports that  has never smoked. he has never used smokeless tobacco. He reports that he does not drink alcohol or use drugs.  ROS: UROLOGY Frequent Urination?: No Hard to postpone urination?: No Burning/pain with urination?: No Get up at night to urinate?: No Leakage of urine?: No Urine stream starts and stops?: No Trouble starting stream?: No Do you have to strain to urinate?: No Blood in urine?: No Urinary tract infection?: No Sexually transmitted disease?: No Injury to kidneys or bladder?: No Painful intercourse?: No Weak stream?: No Erection problems?: No Penile pain?: No  Gastrointestinal Nausea?: No Vomiting?: No Diarrhea?: No Constipation?: No  Constitutional Fever: No Night sweats?: No Weight loss?: Yes Fatigue?: No  Skin Skin rash/lesions?: No Itching?: No  Eyes Blurred vision?: No Double vision?: No  Ears/Nose/Throat Sore throat?: Yes Sinus problems?: Yes  Hematologic/Lymphatic Swollen  glands?: No Easy bruising?: No  Cardiovascular Leg swelling?: No Chest pain?: No  Respiratory Cough?: Yes Shortness of breath?: No  Endocrine Excessive thirst?: No  Musculoskeletal Back pain?: No Joint pain?: No  Neurological Headaches?: No Dizziness?: No  Psychologic Depression?: No Anxiety?: Yes  Physical Exam: Ht 4\' 11"  (1.499 m)   Wt 69 lb (31.3 kg)   BMI 13.94 kg/m   Constitutional:  Alert, No acute distress. HEENT:  AT, moist mucus membranes.  Trachea midline, no masses. Cardiovascular: No clubbing, cyanosis, or edema. Respiratory: Normal respiratory effort, no increased work of breathing. GI: Abdomen is soft, nontender, nondistended, no abdominal masses GU: No CVA tenderness. Skin: No rashes, bruises or suspicious lesions. Lymph: No cervical or inguinal adenopathy.  Laboratory Data: Lab Results  Component Value Date   WBC 5.4 02/05/2017   HGB 13.8 02/05/2017   HCT 41.2 02/05/2017   MCV 94.2 02/05/2017   PLT 174 02/05/2017    Lab Results  Component Value Date   CREATININE 0.81 02/05/2017   Urinalysis Lab Results  Component Value Date   APPEARANCEUR HAZY (A) 02/05/2017   LEUKOCYTESUR NEGATIVE 02/05/2017   PROTEINUR NEGATIVE 02/05/2017   GLUCOSEU NEGATIVE 02/05/2017   RBCU 0-5 02/05/2017   BILIRUBINUR NEGATIVE 02/05/2017   NITRITE NEGATIVE 02/05/2017    Lab Results  Component Value Date   BACTERIA NONE SEEN 02/05/2017    Pertinent Imaging: KUB was personally reviewed  Results for orders placed during the hospital encounter of 08/12/17  Abdomen 1 view (KUB)   Narrative CLINICAL DATA:  Kidney stones.  Abdominal pain and weakness  EXAM: ABDOMEN - 1 VIEW  COMPARISON:  None.  FINDINGS: Levoscoliosis of the lumbar spine. Surgical clips in the RIGHT lower quadrant. Moderate volume stool throughout the colon. Large volume stool in the rectum. No dilated large or small bowel.  Calcification in the LEFT mid abdomen suggests renal  calculus measuring 6 mm  IMPRESSION: 1. Potential LEFT nephrolithiasis. 2. No ureterolithiasis. 3. Moderate volume stool in the rectosigmoid colon.   Electronically Signed   By: Suzy Bouchard M.D.   On: 08/12/2017 15:27     Assessment & Plan:   62 year old male with possible left renal calculus.  If this is a stone it would be in a nonobstructing position and could be monitored.  It would not be causing his weight loss.  Will order a stone protocol CT however will check with Dr. Caryl Comes to he would recommend a CT as part of a weight loss evaluation.   Abbie Sons, [place] [address] [place], [place] [address] [phone]

## 2017-08-13 NOTE — ED Notes (Signed)
Labs sent at this time.

## 2017-08-13 NOTE — ED Notes (Addendum)
This RN attempted to place IVx2. Family at bedside. RN will obtain assistance for IV access now.

## 2017-08-13 NOTE — ED Notes (Signed)
Pt legal guardian at bedside.

## 2017-08-17 LAB — BLOOD GAS, VENOUS
Acid-Base Excess: 3.9 mmol/L — ABNORMAL HIGH (ref 0.0–2.0)
Bicarbonate: 32.2 mmol/L — ABNORMAL HIGH (ref 20.0–28.0)
PCO2 VEN: 64 mmHg — AB (ref 44.0–60.0)
PH VEN: 7.31 (ref 7.250–7.430)
Patient temperature: 37

## 2017-08-19 ENCOUNTER — Other Ambulatory Visit: Payer: Self-pay

## 2017-08-19 MED ORDER — FINASTERIDE 5 MG PO TABS: 5.0000 mg | ORAL_TABLET | Freq: Every day | ORAL | 6 refills | Status: DC

## 2017-12-17 ENCOUNTER — Encounter: Payer: Medicare Other | Attending: Nurse Practitioner | Admitting: Nurse Practitioner

## 2017-12-17 DIAGNOSIS — R64 Cachexia: Secondary | ICD-10-CM | POA: Diagnosis not present

## 2017-12-17 DIAGNOSIS — I252 Old myocardial infarction: Secondary | ICD-10-CM | POA: Insufficient documentation

## 2017-12-17 DIAGNOSIS — H913 Deaf nonspeaking, not elsewhere classified: Secondary | ICD-10-CM | POA: Insufficient documentation

## 2017-12-17 DIAGNOSIS — I739 Peripheral vascular disease, unspecified: Secondary | ICD-10-CM | POA: Insufficient documentation

## 2017-12-17 DIAGNOSIS — Z881 Allergy status to other antibiotic agents status: Secondary | ICD-10-CM | POA: Insufficient documentation

## 2017-12-17 DIAGNOSIS — Z888 Allergy status to other drugs, medicaments and biological substances status: Secondary | ICD-10-CM | POA: Diagnosis not present

## 2017-12-17 DIAGNOSIS — L89222 Pressure ulcer of left hip, stage 2: Secondary | ICD-10-CM | POA: Insufficient documentation

## 2017-12-17 DIAGNOSIS — I10 Essential (primary) hypertension: Secondary | ICD-10-CM | POA: Insufficient documentation

## 2017-12-17 DIAGNOSIS — X58XXXA Exposure to other specified factors, initial encounter: Secondary | ICD-10-CM | POA: Insufficient documentation

## 2017-12-17 DIAGNOSIS — L89213 Pressure ulcer of right hip, stage 3: Secondary | ICD-10-CM | POA: Insufficient documentation

## 2017-12-17 DIAGNOSIS — L89893 Pressure ulcer of other site, stage 3: Secondary | ICD-10-CM | POA: Diagnosis not present

## 2017-12-17 DIAGNOSIS — R634 Abnormal weight loss: Secondary | ICD-10-CM | POA: Insufficient documentation

## 2017-12-17 DIAGNOSIS — S91102A Unspecified open wound of left great toe without damage to nail, initial encounter: Secondary | ICD-10-CM | POA: Diagnosis not present

## 2017-12-17 DIAGNOSIS — H547 Unspecified visual loss: Secondary | ICD-10-CM | POA: Insufficient documentation

## 2017-12-17 DIAGNOSIS — G808 Other cerebral palsy: Secondary | ICD-10-CM | POA: Diagnosis not present

## 2017-12-19 NOTE — Progress Notes (Signed)
SEMIR, BRILL ([id number]) Visit Report for 12/17/2017 Abuse/Suicide Risk Screen Details Patient Name: Shawn Sweeney, Shawn Sweeney. Date of Service: 12/17/2017 12:30 PM Medical Record Number: [id number] Patient Account Number: [id number] Date of Birth/Sex: 13-Nov-1955 (62 y.o. M) Treating RN: Montey Hora Primary Care Nery Kalisz: Ramonita Lab Other Clinician: Referring Hisashi Amadon: Grayland Ormond Treating Coleman Kalas/Extender: Cathie Olden in Treatment: 0 Abuse/Suicide Risk Screen Items Answer ABUSE/SUICIDE RISK SCREEN: Has anyone close to you tried to hurt or harm you recentlyo No Do you feel uncomfortable with anyone in your familyo No Has anyone forced you do things that you didnot want to doo No Do you have any thoughts of harming yourselfo No Patient displays signs or symptoms of abuse and/or neglect. No Electronic Signature(s) Signed: 12/17/2017 4:12:59 PM By: Montey Hora Entered By: Montey Hora on 12/17/2017 13:03:56 Shawn Sweeney ([id number]) -------------------------------------------------------------------------------- Activities of Daily Living Details Patient Name: Shawn Sweeney. Date of Service: 12/17/2017 12:30 PM Medical Record Number: [id number] Patient Account Number: [id number] Date of Birth/Sex: 12/09/1955 (62 y.o. M) Treating RN: Montey Hora Primary Care Rayansh Herbst: Ramonita Lab Other Clinician: Referring Oddie Kuhlmann: Grayland Ormond Treating Marissah Vandemark/Extender: Cathie Olden in Treatment: 0 Activities of Daily Living Items Answer Activities of Daily Living (Please select one for each item) Drive Automobile Not Able Take Medications Need Assistance Use Telephone Not Able Care for Appearance Not Able Use Toilet Not Able Bath / Shower Need Assistance Dress Self Not Able Feed Self Need Assistance Walk Not Able Get In / Out Bed Not Able Housework Not Able Prepare Meals Not Able Handle Money Not Able Shop for Self Not Able Electronic Signature(s) Signed:  12/17/2017 4:12:59 PM By: Montey Hora Entered By: Montey Hora on 12/17/2017 13:04:41 Shawn Sweeney ([id number]) -------------------------------------------------------------------------------- Education Assessment Details Patient Name: Shawn Sweeney. Date of Service: 12/17/2017 12:30 PM Medical Record Number: [id number] Patient Account Number: [id number] Date of Birth/Sex: Apr 29, 1956 (62 y.o. M) Treating RN: Montey Hora Primary Care Velta Rockholt: Ramonita Lab Other Clinician: Referring Loxley Cibrian: Grayland Ormond Treating Al Gagen/Extender: Cathie Olden in Treatment: 0 Primary Learner Assessed: Caregiver Reason Patient is not Primary Learner: mental status Learning Preferences/Education Level/Primary Language Learning Preference: Explanation, Demonstration, Printed Material Highest Education Level: High School Preferred Language: English Cognitive Barrier Assessment/Beliefs Language Barrier: No Translator Needed: No Memory Deficit: No Emotional Barrier: No Cultural/Religious Beliefs Affecting Medical Care: No Physical Barrier Assessment Impaired Vision: No Impaired Hearing: No Decreased Hand dexterity: No Knowledge/Comprehension Assessment Knowledge Level: Medium Comprehension Level: Medium Ability to understand written Medium instructions: Ability to understand verbal Medium instructions: Motivation Assessment Anxiety Level: Calm Cooperation: Cooperative Education Importance: Acknowledges Need Interest in Health Problems: Asks Questions Perception: Coherent Willingness to Engage in Self- Medium Management Activities: Readiness to Engage in Self- Medium Management Activities: Electronic Signature(s) Signed: 12/17/2017 4:12:59 PM By: Montey Hora Entered By: Montey Hora on 12/17/2017 13:05:17 Shawn Sweeney ([id number]) -------------------------------------------------------------------------------- Fall Risk Assessment Details Patient Name:  Shawn Sweeney. Date of Service: 12/17/2017 12:30 PM Medical Record Number: [id number] Patient Account Number: [id number] Date of Birth/Sex: 1955/07/08 (62 y.o. M) Treating RN: Montey Hora Primary Care Jaegar Croft: Ramonita Lab Other Clinician: Referring Catlynn Grondahl: Grayland Ormond Treating Saint Hank/Extender: Cathie Olden in Treatment: 0 Fall Risk Assessment Items Have you had 2 or more falls in the last 12 monthso 0 No Have you had any fall that resulted in injury in the last 12 monthso 0 No FALL RISK ASSESSMENT: History of falling - immediate or within 3 months 0 No Secondary diagnosis 0 No Ambulatory aid None/bed rest/wheelchair/nurse 0  Yes Crutches/cane/walker 0 No Furniture 0 No IV Access/Saline Lock 0 No Gait/Training Normal/bed rest/immobile 0 No Weak 0 No Impaired 20 Yes Mental Status Oriented to own ability 0 Yes Electronic Signature(s) Signed: 12/17/2017 4:12:59 PM By: Montey Hora Entered By: Montey Hora on 12/17/2017 13:05:30 Shawn Sweeney ([id number]) -------------------------------------------------------------------------------- Foot Assessment Details Patient Name: Shawn Sweeney. Date of Service: 12/17/2017 12:30 PM Medical Record Number: [id number] Patient Account Number: [id number] Date of Birth/Sex: 01/05/1956 (63 y.o. M) Treating RN: Montey Hora Primary Care Tatjana Turcott: Ramonita Lab Other Clinician: Referring Livia Tarr: Grayland Ormond Treating Sakura Denis/Extender: Cathie Olden in Treatment: 0 Foot Assessment Items Site Locations + = Sensation present, - = Sensation absent, C = Callus, U = Ulcer R = Redness, W = Warmth, M = Maceration, PU = Pre-ulcerative lesion F = Fissure, S = Swelling, D = Dryness Assessment Right: Left: Other Deformity: No No Prior Foot Ulcer: No No Prior Amputation: No No Charcot Joint: No No Ambulatory Status: Gait: Electronic Signature(s) Signed: 12/17/2017 4:12:59 PM By: Montey Hora Entered By: Montey Hora on 12/17/2017 13:12:47 Shawn Sweeney ([id number]) -------------------------------------------------------------------------------- Nutrition Risk Assessment Details Patient Name: Shawn Sweeney. Date of Service: 12/17/2017 12:30 PM Medical Record Number: [id number] Patient Account Number: [id number] Date of Birth/Sex: 10-21-1955 (62 y.o. M) Treating RN: Montey Hora Primary Care Beacher Every: Ramonita Lab Other Clinician: Referring Carold Eisner: Grayland Ormond Treating Dealie Koelzer/Extender: Cathie Olden in Treatment: 0 Height (in): Weight (lbs): Body Mass Index (BMI): Nutrition Risk Assessment Items NUTRITION RISK SCREEN: I have an illness or condition that made me change the kind and/or amount of 0 No food I eat I eat fewer than two meals per day 0 No I eat few fruits and vegetables, or milk products 0 No I have three or more drinks of beer, liquor or wine almost every day 0 No I have tooth or mouth problems that make it hard for me to eat 2 Yes I don't always have enough money to buy the food I need 0 No I eat alone most of the time 0 No I take three or more different prescribed or over-the-counter drugs a day 1 Yes Without wanting to, I have lost or gained 10 pounds in the last six months 2 Yes I am not always physically able to shop, cook and/or feed myself 2 Yes Nutrition Protocols Good Risk Protocol Moderate Risk Protocol Provide education on High Risk Proctocol 0 Electronic Signature(s) Signed: 12/17/2017 4:12:59 PM By: Montey Hora Entered By: Montey Hora on 12/17/2017 13:05:54

## 2017-12-19 NOTE — Progress Notes (Addendum)
Shawn Sweeney, Shawn Sweeney ([id number]) Visit Report for 12/17/2017 Allergy List Details Patient Name: Shawn Sweeney, Shawn Sweeney. Date of Service: 12/17/2017 12:30 PM Medical Record Number: [id number] Patient Account Number: [id number] Date of Birth/Sex: [birth year] (62 y.o. M) Treating RN: Montey Hora Primary Care Gamaliel Charney: Ramonita Lab Other Clinician: Referring Yasiel Goyne: Grayland Ormond Treating Misaki Sozio/Extender: Lawanda Cousins Weeks in Treatment: 0 Allergies Active Allergies Levaquin Risperdal Allergy Notes Electronic Signature(s) Signed: 12/17/2017 4:12:59 PM By: Montey Hora Entered By: Montey Hora on 12/17/2017 12:55:06 Shawn Sweeney ([id number]) -------------------------------------------------------------------------------- Arrival Information Details Patient Name: Shawn Sweeney Date of Service: 12/17/2017 12:30 PM Medical Record Number: [id number] Patient Account Number: [id number] Date of Birth/Sex: Feb 27, 1956 (62 y.o. M) Treating RN: Montey Hora Primary Care Johnnie Goynes: Ramonita Lab Other Clinician: Referring Dean Goldner: Grayland Ormond Treating Sanford Lindblad/Extender: Cathie Olden in Treatment: 0 Visit Information Patient Arrived: Wheel Chair Arrival Time: 12:48 Accompanied By: staff Transfer Assistance: Manual Patient Identification Verified: Yes Secondary Verification Process Completed: Yes Electronic Signature(s) Signed: 12/17/2017 4:12:59 PM By: Montey Hora Entered By: Montey Hora on 12/17/2017 12:54:10 Shawn Sweeney ([id number]) -------------------------------------------------------------------------------- Clinic Level of Care Assessment Details Patient Name: Shawn Sweeney. Date of Service: 12/17/2017 12:30 PM Medical Record Number: [id number] Patient Account Number: [id number] Date of Birth/Sex: 03/30/1956 (62 y.o. M) Treating RN: Ahmed Prima Primary Care Kalif Kattner: Ramonita Lab Other Clinician: Referring Zaydin Billey: Grayland Ormond Treating  Elantra Caprara/Extender: Cathie Olden in Treatment: 0 Clinic Level of Care Assessment Items TOOL 1 Quantity Score X - Use when EandM and Procedure is performed on INITIAL visit 1 0 ASSESSMENTS - Nursing Assessment / Reassessment X - General Physical Exam (combine w/ comprehensive assessment (listed just below) when 1 20 performed on new pt. evals) X- 1 25 Comprehensive Assessment (HX, ROS, Risk Assessments, Wounds Hx, etc.) ASSESSMENTS - Wound and Skin Assessment / Reassessment []  - Dermatologic / Skin Assessment (not related to wound area) 0 ASSESSMENTS - Ostomy and/or Continence Assessment and Care []  - Incontinence Assessment and Management 0 []  - 0 Ostomy Care Assessment and Management (repouching, etc.) PROCESS - Coordination of Care []  - Simple Patient / Family Education for ongoing care 0 X- 1 20 Complex (extensive) Patient / Family Education for ongoing care X- 1 10 Staff obtains Programmer, systems, Records, Test Results / Process Orders X- 1 10 Staff telephones HHA, Nursing Homes / Clarify orders / etc []  - 0 Routine Transfer to another Facility (non-emergent condition) []  - 0 Routine Hospital Admission (non-emergent condition) X- 1 15 New Admissions / Biomedical engineer / Ordering NPWT, Apligraf, etc. []  - 0 Emergency Hospital Admission (emergent condition) PROCESS - Special Needs []  - Pediatric / Minor Patient Management 0 []  - 0 Isolation Patient Management []  - 0 Hearing / Language / Visual special needs []  - 0 Assessment of Community assistance (transportation, D/C planning, etc.) []  - 0 Additional assistance / Altered mentation []  - 0 Support Surface(s) Assessment (bed, cushion, seat, etc.) Shawn Sweeney, Shawn Sweeney. ([id number]) INTERVENTIONS - Miscellaneous []  - External ear exam 0 []  - 0 Patient Transfer (multiple staff / Civil Service fast streamer / Similar devices) []  - 0 Simple Staple / Suture removal (25 or less) []  - 0 Complex Staple / Suture removal (26 or more) []  -  0 Hypo/Hyperglycemic Management (do not check if billed separately) X- 1 15 Ankle / Brachial Index (ABI) - do not check if billed separately Has the patient been seen at the hospital within the last three years: Yes Total Score: 115 Level Of Care: New/Established - Level 3 Electronic Signature(s) Signed: 12/17/2017  4:29:22 PM By: Alric Quan Entered By: Alric Quan on 12/17/2017 16:19:36 Shawn Sweeney ([id number]) -------------------------------------------------------------------------------- Encounter Discharge Information Details Patient Name: Shawn Sweeney. Date of Service: 12/17/2017 12:30 PM Medical Record Number: [id number] Patient Account Number: [id number] Date of Birth/Sex: 01/13/1956 (62 y.o. M) Treating RN: Roger Shelter Primary Care Junko Ohagan: Ramonita Lab Other Clinician: Referring Mahkayla Preece: Grayland Ormond Treating Sherese Heyward/Extender: Cathie Olden in Treatment: 0 Encounter Discharge Information Items Discharge Condition: Stable Ambulatory Status: Wheelchair Discharge Destination: [place] Telephoned: No Orders Sent: Yes Transportation: Other Schedule Follow-up Appointment: Yes Clinical Summary of Care: Electronic Signature(s) Signed: 12/17/2017 4:15:57 PM By: Roger Shelter Entered By: Roger Shelter on 12/17/2017 13:40:14 Shawn Sweeney ([id number]) -------------------------------------------------------------------------------- Lower Extremity Assessment Details Patient Name: Shawn Sweeney. Date of Service: 12/17/2017 12:30 PM Medical Record Number: [id number] Patient Account Number: [id number] Date of Birth/Sex: 1955-11-09 (62 y.o. M) Treating RN: Montey Hora Primary Care Candler Ginsberg: Ramonita Lab Other Clinician: Referring Jacqulene Huntley: Grayland Ormond Treating Andilyn Bettcher/Extender: Lawanda Cousins Weeks in Treatment: 0 Electronic Signature(s) Signed: 12/17/2017 4:12:59 PM By: Montey Hora Entered By: Montey Hora on  12/17/2017 13:12:40 Shawn Sweeney ([id number]) -------------------------------------------------------------------------------- Multi Wound Chart Details Patient Name: Shawn Sweeney. Date of Service: 12/17/2017 12:30 PM Medical Record Number: [id number] Patient Account Number: [id number] Date of Birth/Sex: [birth year] (63 y.o. M) Treating RN: Ahmed Prima Primary Care Anna Beaird: Ramonita Lab Other Clinician: Referring Sanjith Siwek: Grayland Ormond Treating Orazio Weller/Extender: Cathie Olden in Treatment: 0 Vital Signs Height(in): Pulse(bpm): 69 Weight(lbs): Blood Pressure(mmHg): 110/63 Body Mass Index(BMI): Temperature(F): 97.8 Respiratory Rate 16 (breaths/min): Photos: [3:No Photos] Wound Location: Right Shoulder Right Trochanter Left Trochanter Wounding Event: Pressure Injury Pressure Injury Pressure Injury Primary Etiology: Pressure Ulcer Pressure Ulcer Pressure Ulcer Comorbid History: Anemia, Hypertension Anemia, Hypertension Anemia, Hypertension Date Acquired: 09/29/2017 09/29/2017 12/17/2017 Weeks of Treatment: 0 0 0 Wound Status: Open Open Open Measurements L x W x D 0.5x0.7x0.1 0.6x1.4x0.1 1x1x0.1 (cm) Area (cm) : 0.275 0.66 0.785 Volume (cm) : 0.027 0.066 0.079 Classification: Category/Stage II Category/Stage III Category/Stage I Exudate Amount: Medium Medium None Present Exudate Type: Serous Serous N/A Exudate Color: amber amber N/A Wound Margin: Flat and Intact Flat and Intact Flat and Intact Granulation Amount: Large (67-100%) Medium (34-66%) None Present (0%) Granulation Quality: Pink Pink N/A Necrotic Amount: Small (1-33%) Medium (34-66%) None Present (0%) Exposed Structures: Fascia: No Fat Layer (Subcutaneous Fascia: No Fat Layer (Subcutaneous Tissue) Exposed: Yes Fat Layer (Subcutaneous Tissue) Exposed: No Fascia: No Tissue) Exposed: No Tendon: No Tendon: No Tendon: No Muscle: No Muscle: No Muscle: No Joint: No Joint: No Joint: No Bone:  No Bone: No Bone: No Epithelialization: Small (1-33%) None None Debridement: Debridement - Excisional Debridement - Excisional N/A 13:20 13:20 N/A Shawn Sweeney, Shawn Sweeney ([id number]) Pre-procedure Verification/Time Out Taken: Pain Control: Lidocaine 4% Topical Solution Lidocaine 4% Topical Solution N/A Tissue Debrided: Subcutaneous, Slough Subcutaneous, Slough N/A Level: Skin/Subcutaneous Tissue Skin/Subcutaneous Tissue N/A Debridement Area (sq cm): 0.35 0.84 N/A Instrument: Curette Curette N/A Bleeding: Minimum Minimum N/A Hemostasis Achieved: Pressure Pressure N/A Procedural Pain: 0 0 N/A Post Procedural Pain: 0 0 N/A Debridement Treatment Procedure was tolerated well Procedure was tolerated well N/A Response: Post Debridement 0.5x0.7x0.2 0.6x1.4x0.1 N/A Measurements L x W x D (cm) Post Debridement Volume: 0.055 0.066 N/A (cm) Post Debridement Stage: Category/Stage II Category/Stage III N/A Periwound Skin Texture: Excoriation: No Excoriation: No No Abnormalities Noted Induration: No Induration: No Callus: No Callus: No Crepitus: No Crepitus: No Rash: No Rash: No Scarring: No Scarring: No Periwound Skin Moisture: Maceration:  No Maceration: No No Abnormalities Noted Dry/Scaly: No Dry/Scaly: No Periwound Skin Color: Atrophie Blanche: No Atrophie Blanche: No Erythema: Yes Cyanosis: No Cyanosis: No Ecchymosis: No Ecchymosis: No Erythema: No Erythema: No Hemosiderin Staining: No Hemosiderin Staining: No Mottled: No Mottled: No Pallor: No Pallor: No Rubor: No Rubor: No Erythema Location: N/A N/A Circumferential Temperature: No Abnormality No Abnormality No Abnormality Tenderness on Palpation: No No No Wound Preparation: Ulcer Cleansing: Ulcer Cleansing: Ulcer Cleansing: Rinsed/Irrigated with Saline Rinsed/Irrigated with Saline Rinsed/Irrigated with Saline Topical Anesthetic Applied: Topical Anesthetic Applied: Topical Anesthetic Applied: Other: lidocaine  4% Other: lidocaine 4% None Procedures Performed: Debridement N/A N/A Wound Number: 4 N/A N/A Photos: No Photos N/A N/A Wound Location: Left Toe Great N/A N/A Wounding Event: Gradually Appeared N/A N/A Primary Etiology: To be determined N/A N/A Comorbid History: Anemia, Hypertension N/A N/A Date Acquired: 12/17/2017 N/A N/A Weeks of Treatment: 0 N/A N/A Wound Status: Open N/A N/A Measurements L x W x D 1.5x1x0.1 N/A N/A (cm) Area (cm) : 1.178 N/A N/A Shawn Sweeney, Shawn L. ([id number]) Volume (cm) : 0.118 N/A N/A Classification: Full Thickness Without N/A N/A Exposed Support Structures Exudate Amount: Medium N/A N/A Exudate Type: Serosanguineous N/A N/A Exudate Color: red, brown N/A N/A Wound Margin: Flat and Intact N/A N/A Granulation Amount: Small (1-33%) N/A N/A Granulation Quality: Red N/A N/A Necrotic Amount: Large (67-100%) N/A N/A Exposed Structures: Fascia: No N/A N/A Fat Layer (Subcutaneous Tissue) Exposed: No Tendon: No Muscle: No Joint: No Bone: No Epithelialization: None N/A N/A Debridement: N/A N/A N/A Pain Control: N/A N/A N/A Tissue Debrided: N/A N/A N/A Level: N/A N/A N/A Debridement Area (sq cm): N/A N/A N/A Instrument: N/A N/A N/A Bleeding: N/A N/A N/A Hemostasis Achieved: N/A N/A N/A Procedural Pain: N/A N/A N/A Post Procedural Pain: N/A N/A N/A Debridement Treatment N/A N/A N/A Response: Post Debridement N/A N/A N/A Measurements L x W x D (cm) Post Debridement Volume: N/A N/A N/A (cm) Post Debridement Stage: N/A N/A N/A Periwound Skin Texture: No Abnormalities Noted N/A N/A Periwound Skin Moisture: No Abnormalities Noted N/A N/A Periwound Skin Color: No Abnormalities Noted N/A N/A Erythema Location: N/A N/A N/A Temperature: No Abnormality N/A N/A Tenderness on Palpation: No N/A N/A Wound Preparation: Ulcer Cleansing: N/A N/A Rinsed/Irrigated with Saline Topical Anesthetic Applied: None Procedures Performed: N/A N/A N/A Treatment  Notes Wound #1 (Right Shoulder) 1. Cleansed with: Clean wound with Normal Saline 2. Anesthetic Topical Lidocaine 4% cream to wound bed prior to debridement 4. Dressing Applied: SAN, LOHMEYER ([id number]) Medihoney Gel 5. Secondary Dressing Applied Bordered Foam Dressing Wound #2 (Right Trochanter) 1. Cleansed with: Clean wound with Normal Saline 2. Anesthetic Topical Lidocaine 4% cream to wound bed prior to debridement 4. Dressing Applied: Medihoney Gel 5. Secondary Dressing Applied Bordered Foam Dressing Wound #3 (Left Trochanter) 1. Cleansed with: Clean wound with Normal Saline 5. Secondary Dressing Applied Bordered Foam Dressing Wound #4 (Left Toe Great) 1. Cleansed with: Clean wound with Normal Saline 2. Anesthetic Topical Lidocaine 4% cream to wound bed prior to debridement 4. Dressing Applied: Medihoney Gel 5. Secondary Dressing Applied Bordered Foam Dressing Electronic Signature(s) Signed: 12/17/2017 5:16:45 PM By: Lawanda Cousins Previous Signature: 12/17/2017 4:29:22 PM Version By: Alric Quan Entered By: Lawanda Cousins on 12/17/2017 17:16:45 Shawn Sweeney ([id number]) -------------------------------------------------------------------------------- [place] Details Patient Name: Shawn Sweeney Date of Service: 12/17/2017 12:30 PM Medical Record Number: [id number] Patient Account Number: [id number] Date of Birth/Sex: March 31, 1956 (62 y.o. M) Treating RN: Ahmed Prima Primary Care Shaleigh Laubscher: Caryl Comes  BERT Other Clinician: Referring Lenora Gomes: Grayland Ormond Treating Kymberly Blomberg/Extender: Cathie Olden in Treatment: 0 Active Inactive ` Abuse / Safety / Falls / Self Care Management Nursing Diagnoses: Potential for falls Goals: Patient will remain injury free related to falls Date Initiated: 12/17/2017 Target Resolution Date: 04/03/2018 Goal Status: Active Interventions: Assess Activities of Daily Living upon admission and as  needed Assess fall risk on admission and as needed Assess: immobility, friction, shearing, incontinence upon admission and as needed Assess impairment of mobility on admission and as needed per policy Assess personal safety and home safety (as indicated) on admission and as needed Notes: ` Nutrition Nursing Diagnoses: Imbalanced nutrition Potential for alteratiion in Nutrition/Potential for imbalanced nutrition Goals: Patient/caregiver agrees to and verbalizes understanding of need to use nutritional supplements and/or vitamins as prescribed Date Initiated: 12/17/2017 Target Resolution Date: 04/03/2018 Goal Status: Active Interventions: Assess patient nutrition upon admission and as needed per policy Notes: ` Orientation to the Wound Care Program Nursing Diagnoses: Knowledge deficit related to the wound healing center program MELDRICK, BUTTERY ([id number]) Goals: Patient/caregiver will verbalize understanding of the [place] Date Initiated: 12/17/2017 Target Resolution Date: 01/02/2018 Goal Status: Active Interventions: Provide education on orientation to the wound center Notes: ` Pain, Acute or Chronic Nursing Diagnoses: Pain, acute or chronic: actual or potential Potential alteration in comfort, pain Goals: Patient/caregiver will verbalize adequate pain control between visits Date Initiated: 12/17/2017 Target Resolution Date: 04/03/2018 Goal Status: Active Interventions: Complete pain assessment as per visit requirements Encourage patient to take pain medications as prescribed Implement pain control techniques (non-pharmaceutical) Provide education on pain management Notes: ` Wound/Skin Impairment Nursing Diagnoses: Impaired tissue integrity Knowledge deficit related to ulceration/compromised skin integrity Goals: Ulcer/skin breakdown will have a volume reduction of 80% by week 12 Date Initiated: 12/17/2017 Target Resolution Date: 03/27/2018 Goal  Status: Active Interventions: Assess patient/caregiver ability to perform ulcer/skin care regimen upon admission and as needed Assess ulceration(s) every visit Notes: Electronic Signature(s) Signed: 12/17/2017 4:29:22 PM By: Alric Quan Entered By: Alric Quan on 12/17/2017 13:19:46 Shawn Sweeney ([id number]) Cletus Gash, Aviva Signs ([id number]) -------------------------------------------------------------------------------- Pain Assessment Details Patient Name: Shawn Sweeney. Date of Service: 12/17/2017 12:30 PM Medical Record Number: [id number] Patient Account Number: [id number] Date of Birth/Sex: 09/09/1955 (62 y.o. M) Treating RN: Montey Hora Primary Care Aariya Ferrick: Ramonita Lab Other Clinician: Referring Sherah Lund: Grayland Ormond Treating Timberlyn Pickford/Extender: Cathie Olden in Treatment: 0 Active Problems Location of Pain Severity and Description of Pain Patient Has Paino Patient Unable to Respond Site Locations Pain Management and Medication Current Pain Management: Electronic Signature(s) Signed: 12/17/2017 4:12:59 PM By: Montey Hora Entered By: Montey Hora on 12/17/2017 12:54:17 Shawn Sweeney ([id number]) -------------------------------------------------------------------------------- Patient/Caregiver Education Details Patient Name: Shawn Sweeney. Date of Service: 12/17/2017 12:30 PM Medical Record Number: [id number] Patient Account Number: [id number] Date of Birth/Gender: January 09, 1956 (62 y.o. M) Treating RN: Roger Shelter Primary Care Physician: Ramonita Lab Other Clinician: Referring Physician: Grayland Ormond Treating Physician/Extender: Cathie Olden in Treatment: 0 Education Assessment Education Provided To: Patient Education Topics Provided Wound/Skin Impairment: Handouts: Caring for Your Ulcer Methods: Explain/Verbal Responses: State content correctly Electronic Signature(s) Signed: 12/17/2017 4:15:57 PM By: Roger Shelter Entered By: Roger Shelter on 12/17/2017 13:40:28 Shawn Sweeney ([id number]) -------------------------------------------------------------------------------- Wound Assessment Details Patient Name: Shawn Sweeney. Date of Service: 12/17/2017 12:30 PM Medical Record Number: [id number] Patient Account Number: [id number] Date of Birth/Sex: Jul 18, 1955 (62 y.o. M) Treating RN: Montey Hora Primary Care Riven Mabile: Ramonita Lab Other Clinician: Referring Jaala Bohle: Grayland Ormond Treating  Phinley Schall/Extender: Lawanda Cousins Weeks in Treatment: 0 Wound Status Wound Number: 1 Primary Etiology: Pressure Ulcer Wound Location: Right Shoulder Wound Status: Open Wounding Event: Pressure Injury Comorbid History: Anemia, Hypertension Date Acquired: 09/29/2017 Weeks Of Treatment: 0 Clustered Wound: No Photos Photo Uploaded By: Montey Hora on 12/17/2017 13:41:27 Wound Measurements Length: (cm) 0.5 Width: (cm) 0.7 Depth: (cm) 0.1 Area: (cm) 0.275 Volume: (cm) 0.027 % Reduction in Area: % Reduction in Volume: Epithelialization: Small (1-33%) Tunneling: No Undermining: No Wound Description Classification: Category/Stage II Wound Margin: Flat and Intact Exudate Amount: Medium Exudate Type: Serous Exudate Color: amber Foul Odor After Cleansing: No Slough/Fibrino Yes Wound Bed Granulation Amount: Large (67-100%) Exposed Structure Granulation Quality: Pink Fascia Exposed: No Necrotic Amount: Small (1-33%) Fat Layer (Subcutaneous Tissue) Exposed: No Necrotic Quality: Adherent Slough Tendon Exposed: No Muscle Exposed: No Joint Exposed: No Bone Exposed: No Periwound Skin Texture Shawn Sweeney, Shawn L. ([id number]) Texture Color No Abnormalities Noted: No No Abnormalities Noted: No Callus: No Atrophie Blanche: No Crepitus: No Cyanosis: No Excoriation: No Ecchymosis: No Induration: No Erythema: No Rash: No Hemosiderin Staining: No Scarring: No Mottled: No Pallor:  No Moisture Rubor: No No Abnormalities Noted: No Dry / Scaly: No Temperature / Pain Maceration: No Temperature: No Abnormality Wound Preparation Ulcer Cleansing: Rinsed/Irrigated with Saline Topical Anesthetic Applied: Other: lidocaine 4%, Treatment Notes Wound #1 (Right Shoulder) 1. Cleansed with: Clean wound with Normal Saline 2. Anesthetic Topical Lidocaine 4% cream to wound bed prior to debridement 4. Dressing Applied: Medihoney Gel 5. Secondary Dressing Applied Bordered Foam Dressing Electronic Signature(s) Signed: 12/17/2017 4:12:59 PM By: Montey Hora Entered By: Montey Hora on 12/17/2017 13:11:31 Shawn Sweeney ([id number]) -------------------------------------------------------------------------------- Wound Assessment Details Patient Name: Shawn Sweeney. Date of Service: 12/17/2017 12:30 PM Medical Record Number: [id number] Patient Account Number: [id number] Date of Birth/Sex: October 27, 1955 (62 y.o. M) Treating RN: Montey Hora Primary Care Draiden Mirsky: Ramonita Lab Other Clinician: Referring Maliek Schellhorn: Grayland Ormond Treating Corin Tilly/Extender: Cathie Olden in Treatment: 0 Wound Status Wound Number: 2 Primary Etiology: Pressure Ulcer Wound Location: Right Trochanter Wound Status: Open Wounding Event: Pressure Injury Comorbid History: Anemia, Hypertension Date Acquired: 09/29/2017 Weeks Of Treatment: 0 Clustered Wound: No Photos Photo Uploaded By: Montey Hora on 12/17/2017 13:41:28 Wound Measurements Length: (cm) 0.6 Width: (cm) 1.4 Depth: (cm) 0.1 Area: (cm) 0.66 Volume: (cm) 0.066 % Reduction in Area: % Reduction in Volume: Epithelialization: None Tunneling: No Undermining: No Wound Description Classification: Category/Stage III Wound Margin: Flat and Intact Exudate Amount: Medium Exudate Type: Serous Exudate Color: amber Foul Odor After Cleansing: No Slough/Fibrino Yes Wound Bed Granulation Amount: Medium (34-66%) Exposed  Structure Granulation Quality: Pink Fascia Exposed: No Necrotic Amount: Medium (34-66%) Fat Layer (Subcutaneous Tissue) Exposed: Yes Necrotic Quality: Adherent Slough Tendon Exposed: No Muscle Exposed: No Joint Exposed: No Bone Exposed: No Periwound Skin Texture Shawn Sweeney, Shawn L. ([id number]) Texture Color No Abnormalities Noted: No No Abnormalities Noted: No Callus: No Atrophie Blanche: No Crepitus: No Cyanosis: No Excoriation: No Ecchymosis: No Induration: No Erythema: No Rash: No Hemosiderin Staining: No Scarring: No Mottled: No Pallor: No Moisture Rubor: No No Abnormalities Noted: No Dry / Scaly: No Temperature / Pain Maceration: No Temperature: No Abnormality Wound Preparation Ulcer Cleansing: Rinsed/Irrigated with Saline Topical Anesthetic Applied: Other: lidocaine 4%, Treatment Notes Wound #2 (Right Trochanter) 1. Cleansed with: Clean wound with Normal Saline 2. Anesthetic Topical Lidocaine 4% cream to wound bed prior to debridement 4. Dressing Applied: Medihoney Gel 5. Secondary Dressing Applied Bordered Foam Dressing Electronic Signature(s) Signed: 12/17/2017 4:12:59 PM By:  Dorthy, Di Kindle Entered By: Montey Hora on 12/17/2017 13:12:29 Shawn Sweeney ([id number]) -------------------------------------------------------------------------------- Wound Assessment Details Patient Name: Shawn Sweeney, Shawn Sweeney. Date of Service: 12/17/2017 12:30 PM Medical Record Number: [id number] Patient Account Number: [id number] Date of Birth/Sex: [birth year] (62 y.o. M) Treating RN: Ahmed Prima Primary Care Domenique Quest: Ramonita Lab Other Clinician: Referring Zackerie Sara: Grayland Ormond Treating Jocelyn Lowery/Extender: Cathie Olden in Treatment: 0 Wound Status Wound Number: 3 Primary Etiology: Pressure Ulcer Wound Location: Left Trochanter Wound Status: Open Wounding Event: Pressure Injury Comorbid History: Anemia, Hypertension Date Acquired: 12/17/2017 Weeks Of  Treatment: 0 Clustered Wound: No Wound Measurements Length: (cm) 1 % Reduc Width: (cm) 1 % Reduc Depth: (cm) 0.1 Epithel Area: (cm) 0.785 Tunnel Volume: (cm) 0.079 Underm tion in Area: tion in Volume: ialization: None ing: No ining: No Wound Description Classification: Category/Stage I Foul O Wound Margin: Flat and Intact Slough Exudate Amount: None Present dor After Cleansing: No /Fibrino No Wound Bed Granulation Amount: None Present (0%) Exposed Structure Necrotic Amount: None Present (0%) Fascia Exposed: No Fat Layer (Subcutaneous Tissue) Exposed: No Tendon Exposed: No Muscle Exposed: No Joint Exposed: No Bone Exposed: No Periwound Skin Texture Texture Color No Abnormalities Noted: No No Abnormalities Noted: No Erythema: Yes Moisture Erythema Location: Circumferential No Abnormalities Noted: No Temperature / Pain Temperature: No Abnormality Wound Preparation Ulcer Cleansing: Rinsed/Irrigated with Saline Topical Anesthetic Applied: None Treatment Notes Wound #3 (Left Trochanter) 1. Cleansed with: Shawn Sweeney ([id number]) Clean wound with Normal Saline 5. Secondary Dressing Applied Bordered Foam Dressing Electronic Signature(s) Signed: 12/17/2017 4:29:22 PM By: Alric Quan Entered By: Alric Quan on 12/17/2017 13:28:07 Shawn Sweeney ([id number]) -------------------------------------------------------------------------------- Wound Assessment Details Patient Name: Shawn Sweeney. Date of Service: 12/17/2017 12:30 PM Medical Record Number: [id number] Patient Account Number: [id number] Date of Birth/Sex: 21-Apr-1956 (62 y.o. M) Treating RN: Ahmed Prima Primary Care Deundra Bard: Ramonita Lab Other Clinician: Referring Wei Newbrough: Grayland Ormond Treating Tijana Walder/Extender: Cathie Olden in Treatment: 0 Wound Status Wound Number: 4 Primary Etiology: To be determined Wound Location: Left Toe Great Wound Status: Open Wounding Event:  Gradually Appeared Comorbid History: Anemia, Hypertension Date Acquired: 12/17/2017 Weeks Of Treatment: 0 Clustered Wound: No Wound Measurements Length: (cm) 1.5 % Reduc Width: (cm) 1 % Reduc Depth: (cm) 0.1 Epithel Area: (cm) 1.178 Tunnel Volume: (cm) 0.118 Underm tion in Area: tion in Volume: ialization: None ing: No ining: No Wound Description Full Thickness Without Exposed Support Foul O Classification: Structures Slough Wound Margin: Flat and Intact Exudate Medium Amount: Exudate Type: Serosanguineous Exudate Color: red, brown dor After Cleansing: No /Fibrino Yes Wound Bed Granulation Amount: Small (1-33%) Exposed Structure Granulation Quality: Red Fascia Exposed: No Necrotic Amount: Large (67-100%) Fat Layer (Subcutaneous Tissue) Exposed: No Necrotic Quality: Adherent Slough Tendon Exposed: No Muscle Exposed: No Joint Exposed: No Bone Exposed: No Periwound Skin Texture Texture Color No Abnormalities Noted: No No Abnormalities Noted: No Moisture Temperature / Pain No Abnormalities Noted: No Temperature: No Abnormality Wound Preparation Ulcer Cleansing: Rinsed/Irrigated with Saline Topical Anesthetic Applied: None Treatment Notes Wound #4 (Left Toe Great) Shawn Sweeney, Shawn L. ([id number]) 1. Cleansed with: Clean wound with Normal Saline 2. Anesthetic Topical Lidocaine 4% cream to wound bed prior to debridement 4. Dressing Applied: Medihoney Gel 5. Secondary Dressing Applied Bordered Foam Dressing Electronic Signature(s) Signed: 12/17/2017 4:29:22 PM By: Alric Quan Entered By: Alric Quan on 12/17/2017 13:29:29 Shawn Sweeney ([id number]) -------------------------------------------------------------------------------- [place] Details Patient Name: Shawn Sweeney. Date of Service: 12/17/2017 12:30 PM Medical Record Number: [id number] Patient Account Number: [id number] Date  of Birth/Sex: 1956-04-15 (62 y.o. M) Treating RN: Montey Hora Primary Care Guadalupe Kerekes: Ramonita Lab Other Clinician: Referring Sirenia Whitis: Grayland Ormond Treating Allexa Acoff/Extender: Cathie Olden in Treatment: 0 Vital Signs Time Taken: 12:54 Temperature (F): 97.8 Pulse (bpm): 79 Respiratory Rate (breaths/min): 16 Blood Pressure (mmHg): 110/63 Reference Range: 80 - 120 mg / dl Electronic Signature(s) Signed: 12/17/2017 4:12:59 PM By: Montey Hora Entered By: Montey Hora on 12/17/2017 12:54:43

## 2017-12-24 ENCOUNTER — Encounter: Payer: Medicare Other | Admitting: Nurse Practitioner

## 2017-12-24 DIAGNOSIS — L89213 Pressure ulcer of right hip, stage 3: Secondary | ICD-10-CM | POA: Diagnosis not present

## 2017-12-24 NOTE — Progress Notes (Signed)
Shawn Sweeney, Shawn Sweeney ([id number]) Visit Report for 12/17/2017 Chief Complaint Document Details Patient Name: Shawn Sweeney, Shawn Sweeney. Date of Service: 12/17/2017 12:30 PM Medical Record Number: [id number] Patient Account Number: [id number] Date of Birth/Sex: 05/30/1956 (62 y.o. M) Treating RN: Ahmed Prima Primary Care Provider: Ramonita Lab Other Clinician: Referring Provider: Ramonita Lab Treating Provider/Extender: Cathie Olden in Treatment: 0 Information Obtained from: Patient Chief Complaint multiple wounds Electronic Signature(s) Signed: 12/17/2017 5:17:38 PM By: Lawanda Cousins Entered By: Lawanda Cousins on 12/17/2017 17:17:38 Shawn Sweeney ([id number]) -------------------------------------------------------------------------------- Debridement Details Patient Name: Shawn Sweeney. Date of Service: 12/17/2017 12:30 PM Medical Record Number: [id number] Patient Account Number: [id number] Date of Birth/Sex: 05/05/1956 (62 y.o. M) Treating RN: Ahmed Prima Primary Care Provider: Ramonita Lab Other Clinician: Referring Provider: Ramonita Lab Treating Provider/Extender: Cathie Olden in Treatment: 0 Debridement Performed for Wound #1 Right Shoulder Assessment: Performed By: Physician Lawanda Cousins, NP Debridement Type: Debridement Pre-procedure Verification/Time Yes - 13:20 Out Taken: Start Time: 13:20 Pain Control: Lidocaine 4% Topical Solution Total Area Debrided (L x W): 0.5 (cm) x 0.7 (cm) = 0.35 (cm) Tissue and other material Viable, Non-Viable, Slough, Subcutaneous, Fibrin/Exudate, Slough debrided: Level: Skin/Subcutaneous Tissue Debridement Description: Excisional Instrument: Curette Bleeding: Minimum Hemostasis Achieved: Pressure End Time: 13:22 Procedural Pain: 0 Post Procedural Pain: 0 Response to Treatment: Procedure was tolerated well Level of Consciousness: Responds to Painful Stimuli Post Procedure Vitals: Temperature: 97.8 Pulse: 79 Respiratory  Rate: 16 Blood Pressure: Systolic Blood Pressure: 951 Diastolic Blood Pressure: 63 Post Debridement Measurements of Total Wound Length: (cm) 0.5 Stage: Category/Stage II Width: (cm) 0.7 Depth: (cm) 0.2 Volume: (cm) 0.055 Character of Wound/Ulcer Post Requires Further Debridement Debridement: Post Procedure Diagnosis Same as Pre-procedure Electronic Signature(s) Signed: 12/17/2017 4:29:22 PM By: Alric Quan Signed: 12/17/2017 5:32:34 PM By: Lawanda Cousins Entered By: Alric Quan on 12/17/2017 13:21:26 Shawn Sweeney ([name], Shawn Sweeney Signs ([id number]) -------------------------------------------------------------------------------- Debridement Details Patient Name: Shawn Sweeney. Date of Service: 12/17/2017 12:30 PM Medical Record Number: [id number] Patient Account Number: [id number] Date of Birth/Sex: July 16, 1955 (62 y.o. M) Treating RN: Ahmed Prima Primary Care Provider: Ramonita Lab Other Clinician: Referring Provider: Ramonita Lab Treating Provider/Extender: Cathie Olden in Treatment: 0 Debridement Performed for Wound #2 Right Trochanter Assessment: Performed By: Physician Lawanda Cousins, NP Debridement Type: Debridement Pre-procedure Verification/Time Yes - 13:20 Out Taken: Start Time: 13:20 Pain Control: Lidocaine 4% Topical Solution Total Area Debrided (L x W): 0.6 (cm) x 1.4 (cm) = 0.84 (cm) Tissue and other material Viable, Non-Viable, Slough, Subcutaneous, Fibrin/Exudate, Slough debrided: Level: Skin/Subcutaneous Tissue Debridement Description: Excisional Instrument: Curette Bleeding: Minimum Hemostasis Achieved: Pressure End Time: 13:22 Procedural Pain: 0 Post Procedural Pain: 0 Response to Treatment: Procedure was tolerated well Level of Consciousness: Responds to Painful Stimuli Post Procedure Vitals: Temperature: 97.8 Pulse: 79 Respiratory Rate: 16 Blood Pressure: Systolic Blood Pressure: 542 Diastolic Blood Pressure:  63 Post Debridement Measurements of Total Wound Length: (cm) 0.6 Stage: Category/Stage III Width: (cm) 1.4 Depth: (cm) 0.1 Volume: (cm) 0.066 Character of Wound/Ulcer Post Requires Further Debridement Debridement: Post Procedure Diagnosis Same as Pre-procedure Electronic Signature(s) Signed: 12/17/2017 5:17:28 PM By: Lawanda Cousins Signed: 12/23/2017 4:02:05 PM By: Alric Quan Entered By: Lawanda Cousins on 12/17/2017 17:17:28 Shawn Sweeney ([name], Shawn Sweeney Signs ([id number]) -------------------------------------------------------------------------------- HPI Details Patient Name: Shawn Sweeney. Date of Service: 12/17/2017 12:30 PM Medical Record Number: [id number] Patient Account Number: [id number] Date of Birth/Sex: October 14, 1955 (62 y.o. M) Treating RN: Ahmed Prima Primary Care Provider: Ramonita Lab Other Clinician: Referring Provider: Caryl Comes  BERT Treating Provider/Extender: Lawanda Cousins Weeks in Treatment: 0 History of Present Illness HPI Description: 12/17/17- He presents for initial evaluation for multiple areas of pressure injury. He resides is a group home and is accompanied by staff that are covering for his regular caregivers. 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 He appears like adult ftt; it is reported that he has had a significant weight loss. I will reach out to the group home staff on Monday for more details Shawn Sweeney [phone]). Electronic Signature(s) Signed: 12/17/2017 5:21:04 PM By: Lawanda Cousins Entered By: Lawanda Cousins on 12/17/2017 17:21:04 Shawn Sweeney ([id number]) -------------------------------------------------------------------------------- Physical Exam Details Patient Name: Shawn Sweeney, Shawn Sweeney. Date of Service: 12/17/2017 12:30 PM Medical Record Number: [id number] Patient Account Number: [id number] Date of Birth/Sex: 07/17/1955 (62 y.o. M) Treating RN: Ahmed Prima Primary Care Provider: Ramonita Lab Other Clinician: Referring Provider: Ramonita Lab Treating Provider/Extender: Lawanda Cousins Weeks in Treatment: 0 Respiratory respirations are even and unlabored. clear throughout. Cardiovascular s1 s2 regular rate and rhythm. Musculoskeletal bilateral upper and lower extremity contractures. Psychiatric non-verbal, deaf, blind. calm. Electronic Signature(s) Signed: 12/17/2017 5:22:21 PM By: Lawanda Cousins Entered By: Lawanda Cousins on 12/17/2017 17:22:21 Shawn Sweeney ([id number]) -------------------------------------------------------------------------------- Physician Orders Details Patient Name: Shawn Sweeney Date of Service: 12/17/2017 12:30 PM Medical Record Number: [id number] Patient Account Number: [id number] Date of Birth/Sex: 02-Mar-1956 (61 y.o. M) Treating RN: Ahmed Prima Primary Care Provider: Ramonita Lab Other Clinician: Referring Provider: Ramonita Lab Treating Provider/Extender: Cathie Olden in Treatment: 0 Verbal / Phone Orders: Yes Clinician: Carolyne Fiscal, Debi Read Back and Verified: Yes Diagnosis Coding Wound Cleansing Wound #1 Right Shoulder o Clean wound with Normal Saline. o Cleanse wound with mild soap and water o May Shower, gently pat wound dry prior to applying new dressing. Wound #2 Right Trochanter o Clean wound with Normal Saline. o Cleanse wound with mild soap and water o May Shower, gently pat wound dry prior to applying new dressing. Wound #3 Left Trochanter o Clean wound with Normal Saline. o Cleanse wound with mild soap and water o May Shower, gently pat wound dry prior to applying new dressing. Wound #4 Left Toe Great o Clean wound with Normal Saline. o Cleanse wound with mild soap and water o May Shower, gently pat wound dry prior to applying new dressing. Anesthetic (add to Medication List) Wound #1 Right Shoulder o Topical Lidocaine 4% cream applied to wound bed prior to debridement (In Clinic  Only). Wound #2 Right Trochanter o Topical Lidocaine 4% cream applied to wound bed prior to debridement (In Clinic Only). Primary Wound Dressing Wound #1 Right Shoulder o Medihoney gel Wound #2 Right Trochanter o Medihoney gel Wound #4 Left Toe Great o Medihoney gel Secondary Dressing Wound #1 Right Shoulder o Boardered Foam Dressing Wound #2 Right Trochanter o Boardered Foam Dressing Shawn Sweeney, Shawn Sweeney. ([id number]) Wound #3 Left Trochanter o Boardered Foam Dressing Wound #4 Left Toe Great o Boardered Foam Dressing Dressing Change Frequency Wound #1 Right Shoulder o Change Dressing Monday, Wednesday, Friday Wound #2 Right Trochanter o Change Dressing Monday, Wednesday, Friday Wound #3 Left Trochanter o Change Dressing Monday, Wednesday, Friday Wound #4 Left Toe Great o Change Dressing Monday, Wednesday, Friday Follow-up Appointments Wound #1 Right Shoulder o Return Appointment in 1 week. Wound #2 Right Trochanter o Return Appointment in 1 week. Wound #3 Left Trochanter o Return Appointment in 1 week. Wound #4 Left Toe Great o Return Appointment in 1 week. Off-Loading Wound #1  Right Shoulder o Mattress o Turn and reposition every 2 hours o Other: - hospital bed Wound #2 Right Trochanter o Mattress o Turn and reposition every 2 hours o Other: - hospital bed Wound #3 Left Trochanter o Mattress o Turn and reposition every 2 hours o Other: - hospital bed Wound #4 Left Toe Great o Mattress o Turn and reposition every 2 hours o Other: - hospital bed Additional Orders / Instructions Wound #1 Right Shoulder Cannell, Adryen L. ([id number]) o Vitamin A; Vitamin C, Zinc o Increase protein intake. Wound #2 Right Trochanter o Vitamin A; Vitamin C, Zinc o Increase protein intake. Wound #3 Left Trochanter o Vitamin A; Vitamin C, Zinc o Increase protein intake. Wound #4 Left Toe Great o Vitamin A;  Vitamin C, Zinc o Increase protein intake. Patient Medications Allergies: Levaquin, Risperdal Notifications Medication Indication Start End lidocaine DOSE 1 - topical 4 % cream - 1 cream topical Electronic Signature(s) Signed: 12/17/2017 4:29:22 PM By: Alric Quan Signed: 12/17/2017 5:32:34 PM By: Lawanda Cousins Entered By: Alric Quan on 12/17/2017 15:11:35 Shawn Sweeney ([id number]) -------------------------------------------------------------------------------- Prescription 12/17/2017 Patient Name: Shawn Sweeney. Provider: Lawanda Cousins NP Date of Birth: 05/28/1956 NPI#: [id number] Sex: Jerilynn Mages DEA#: [id number] Phone #: [phone] License #: Patient Address: [facility] [place], [place] [address] [address] [place], [place] [address] [phone] Allergies Levaquin Risperdal Medication Medication: Route: Strength: Form: lidocaine 4 % topical cream topical 4% cream Class: TOPICAL LOCAL ANESTHETICS Dose: Frequency / Time: Indication: 1 1 cream topical Number of Refills: Number of Units: 0 Generic Substitution: Start Date: End Date: One Time Use: Substitution Permitted No Note to Pharmacy: Signature(s): Date(s): Electronic Signature(s) Signed: 12/17/2017 4:29:22 PM By: Alric Quan Signed: 12/17/2017 5:32:34 PM By: Lawanda Cousins Entered By: Alric Quan on 12/17/2017 15:11:36 Shawn Sweeney ([name], Shawn Sweeney Signs ([id number]) --------------------------------------------------------------------------------  Problem List Details Patient Name: OCTAVIUS, SHIN. Date of Service: 12/17/2017 12:30 PM Medical Record Number: [id number] Patient Account Number: [id number] Date of Birth/Sex: [birth year] (62 y.o. M) Treating RN: Ahmed Prima Primary Care Provider: Ramonita Lab Other Clinician: Referring Provider: Ramonita Lab Treating Provider/Extender:  Cathie Olden in Treatment: 0 Active Problems ICD-10 Evaluated Encounter Code Description Active Date Today Diagnosis L89.213 Pressure ulcer of right hip, stage 3 12/17/2017 No Yes L89.93 Pressure ulcer of unspecified site, stage 3 12/17/2017 No Yes L89.221 Pressure ulcer of left hip, stage 1 12/17/2017 No Yes S91.102S Unspecified open wound of left great toe without damage to 12/17/2017 No Yes nail, sequela R64 Cachexia 12/17/2017 No Yes Inactive Problems Resolved Problems Electronic Signature(s) Signed: 12/17/2017 5:16:28 PM By: Lawanda Cousins Previous Signature: 12/17/2017 3:00:17 PM Version By: Lawanda Cousins Entered By: Lawanda Cousins on 12/17/2017 17:16:28 Shawn Sweeney ([id number]) -------------------------------------------------------------------------------- Progress Note Details Patient Name: Shawn Sweeney. Date of Service: 12/17/2017 12:30 PM Medical Record Number: [id number] Patient Account Number: [id number] Date of Birth/Sex: [birth year] (62 y.o. M) Treating RN: Ahmed Prima Primary Care Provider: Ramonita Lab Other Clinician: Referring Provider: Ramonita Lab Treating Provider/Extender: Cathie Olden in Treatment: 0 Subjective Chief Complaint Information obtained from Patient multiple wounds History of Present Illness (HPI) 12/17/17- He presents for initial evaluation for multiple areas of pressure injury. He resides is a group home and is accompanied by staff that are covering for his regular caregivers. 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 He appears like adult ftt; it is reported that he has had  a significant weight loss. I will reach out to the group home staff on Monday for more details Shawn Sweeney [id number]). Wound History Patient presents with 2 open wounds that have been present for approximately 3 weeks. Patient has been treating wounds in the following manner: gauze. Laboratory tests have not been performed in  the last month. Patient reportedly has not tested positive for an antibiotic resistant organism. Patient reportedly has not tested positive for osteomyelitis. Patient reportedly has not had testing performed to evaluate circulation in the legs. Patient History Information obtained from Caregiver. Allergies Levaquin, Risperdal Social History Never smoker, Marital Status - Single, Alcohol Use - Never, Drug Use - No History, Caffeine Use - Never. Medical History Eyes Denies history of Cataracts, Glaucoma, Optic Neuritis Ear/Nose/Mouth/Throat Denies history of Chronic sinus problems/congestion, Middle ear problems Hematologic/Lymphatic Patient has history of Anemia Denies history of Hemophilia, Human Immunodeficiency Virus, Lymphedema, Sickle Cell Disease Respiratory Denies history of Aspiration, Asthma, Chronic Obstructive Pulmonary Disease (COPD), Pneumothorax, Sleep Apnea, Tuberculosis Cardiovascular Patient has history of Hypertension Denies history of Angina, Arrhythmia, Congestive Heart Failure, Coronary Artery Disease, Deep Vein Thrombosis, Hypotension, Myocardial Infarction, Peripheral Arterial Disease, Peripheral Venous Disease, Phlebitis, Vasculitis Gastrointestinal Denies history of Cirrhosis , Colitis, Crohn s, Hepatitis A, Hepatitis B, Hepatitis C Endocrine Denies history of Type I Diabetes, Type II Diabetes Shawn Sweeney, Shawn Sweeney ([id number]) Genitourinary Denies history of End Stage Renal Disease Immunological Denies history of Lupus Erythematosus, Raynaud s, Scleroderma Integumentary (Skin) Denies history of History of Burn, History of pressure wounds Musculoskeletal Denies history of Gout, Rheumatoid Arthritis, Osteoarthritis, Osteomyelitis Neurologic Denies history of Dementia, Neuropathy, Quadriplegia, Paraplegia, Seizure Disorder Oncologic Denies history of Received Chemotherapy, Received Radiation Medical And Surgical History  Notes Eyes blind Ear/Nose/Mouth/Throat deaf, unable to speak Gastrointestinal manutrition 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 Eyes Denies complaints or symptoms of Dry Eyes, Vision Changes, Glasses / Contacts. Ear/Nose/Mouth/Throat Denies complaints or symptoms of Difficult clearing ears, Sinusitis. Hematologic/Lymphatic Denies complaints or symptoms of Bleeding / Clotting Disorders, Human Immunodeficiency Virus. Respiratory Denies complaints or symptoms of Chronic or frequent coughs, Shortness of Breath. Cardiovascular Complains or has symptoms of LE edema. Denies complaints or symptoms of Chest pain. Gastrointestinal Denies complaints or symptoms of Frequent diarrhea, Nausea, Vomiting. Endocrine Denies complaints or symptoms of Hepatitis, Thyroid disease, Polydypsia (Excessive Thirst). Genitourinary Complains or has symptoms of Incontinence/dribbling. Denies complaints or symptoms of Kidney failure/ Dialysis. Immunological Denies complaints or symptoms of Hives, Itching. Integumentary (Skin) Complains or has symptoms of Wounds, Breakdown. Denies complaints or symptoms of Bleeding or bruising tendency, Swelling. Musculoskeletal Denies complaints or symptoms of Muscle Pain, Muscle Weakness. Neurologic Denies complaints or symptoms of Numbness/parasthesias, Focal/Weakness. Shawn Sweeney, Shawn Sweeney ([id number]) General Notes: unknown family history Objective Constitutional Vitals Time Taken: 12:54 PM, Temperature: 97.8 F, Pulse: 79 bpm, Respiratory Rate: 16 breaths/min, Blood Pressure: 110/63 mmHg. Respiratory respirations are even and unlabored. clear throughout. Cardiovascular s1 s2 regular rate and rhythm. Musculoskeletal bilateral upper and  lower extremity contractures. Psychiatric non-verbal, deaf, blind. calm. Integumentary (Hair, Skin) Wound #1 status is Open. Original cause of wound was Pressure Injury. The wound is located on the Right Shoulder. The wound measures 0.5cm length x 0.7cm width x 0.1cm depth; 0.275cm^2 area and 0.027cm^3 volume. There is no tunneling or undermining noted. There is a medium amount of serous drainage noted. The wound margin is flat and intact. There is  large (67-100%) pink granulation within the wound bed. There is a small (1-33%) amount of necrotic tissue within the wound bed including Adherent Slough. The periwound skin appearance did not exhibit: Callus, Crepitus, Excoriation, Induration, Rash, Scarring, Dry/Scaly, Maceration, Atrophie Blanche, Cyanosis, Ecchymosis, Hemosiderin Staining, Mottled, Pallor, Rubor, Erythema. Periwound temperature was noted as No Abnormality. Wound #2 status is Open. Original cause of wound was Pressure Injury. The wound is located on the Right Trochanter. The wound measures 0.6cm length x 1.4cm width x 0.1cm depth; 0.66cm^2 area and 0.066cm^3 volume. There is Fat Layer (Subcutaneous Tissue) Exposed exposed. There is no tunneling or undermining noted. There is a medium amount of serous drainage noted. The wound margin is flat and intact. There is medium (34-66%) pink granulation within the wound bed. There is a medium (34-66%) amount of necrotic tissue within the wound bed including Adherent Slough. The periwound skin appearance did not exhibit: Callus, Crepitus, Excoriation, Induration, Rash, Scarring, Dry/Scaly, Maceration, Atrophie Blanche, Cyanosis, Ecchymosis, Hemosiderin Staining, Mottled, Pallor, Rubor, Erythema. Periwound temperature was noted as No Abnormality. Wound #3 status is Open. Original cause of wound was Pressure Injury. The wound is located on the Left Trochanter. The wound measures 1cm length x 1cm width x 0.1cm depth; 0.785cm^2 area and 0.079cm^3  volume. There is no tunneling or undermining noted. There is a none present amount of drainage noted. The wound margin is flat and intact. There is no granulation within the wound bed. There is no necrotic tissue within the wound bed. The periwound skin appearance exhibited: Erythema. The surrounding wound skin color is noted with erythema which is circumferential. Periwound temperature was noted as No Abnormality. Wound #4 status is Open. Original cause of wound was Gradually Appeared. The wound is located on the Left Toe Great. The wound measures 1.5cm length x 1cm width x 0.1cm depth; 1.178cm^2 area and 0.118cm^3 volume. There is no tunneling or undermining noted. There is a medium amount of serosanguineous drainage noted. The wound margin is flat and intact. There is small (1-33%) red granulation within the wound bed. There is a large (67-100%) amount of necrotic tissue Shawn Sweeney, Shawn L. ([id number]) within the wound bed including Adherent Slough. Periwound temperature was noted as No Abnormality. Assessment Active Problems ICD-10 Pressure ulcer of right hip, stage 3 Pressure ulcer of unspecified site, stage 3 Pressure ulcer of left hip, stage 1 Unspecified open wound of left great toe without damage to nail, sequela Cachexia Procedures Wound #1 Pre-procedure diagnosis of Wound #1 is a Pressure Ulcer located on the Right Shoulder . There was a Excisional Skin/Subcutaneous Tissue Debridement with a total area of 0.35 sq cm performed by Lawanda Cousins, NP. With the following instrument(s): Curette to remove Viable and Non-Viable tissue/material. Material removed includes Subcutaneous Tissue, Slough, and Fibrin/Exudate after achieving pain control using Lidocaine 4% Topical Solution. No specimens were taken. A time out was conducted at 13:20, prior to the start of the procedure. A Minimum amount of bleeding was controlled with Pressure. The procedure was tolerated well with a pain level of 0  throughout and a pain level of 0 following the procedure. Patient s Level of Consciousness post procedure was recorded as Responds to Painful Stimuli. Post Debridement Measurements: 0.5cm length x 0.7cm width x 0.2cm depth; 0.055cm^3 volume. Post debridement Stage noted as Category/Stage II. Character of Wound/Ulcer Post Debridement requires further debridement. Post procedure Diagnosis Wound #1: Same as Pre-Procedure Wound #2 Pre-procedure diagnosis of Wound #2 is a Pressure Ulcer located on the Right Trochanter .  There was a Excisional Skin/Subcutaneous Tissue Debridement with a total area of 0.84 sq cm performed by Lawanda Cousins, NP. With the following instrument(s): Curette to remove Viable and Non-Viable tissue/material. Material removed includes Subcutaneous Tissue, Slough, and Fibrin/Exudate after achieving pain control using Lidocaine 4% Topical Solution. No specimens were taken. A time out was conducted at 13:20, prior to the start of the procedure. A Minimum amount of bleeding was controlled with Pressure. The procedure was tolerated well with a pain level of 0 throughout and a pain level of 0 following the procedure. Patient s Level of Consciousness post procedure was recorded as Responds to Painful Stimuli. Post Debridement Measurements: 0.6cm length x 1.4cm width x 0.1cm depth; 0.066cm^3 volume. Post debridement Stage noted as Category/Stage III. Character of Wound/Ulcer Post Debridement requires further debridement. Post procedure Diagnosis Wound #2: Same as Pre-Procedure Plan Wound Cleansing: SAURABH, HETTICH. ([id number]) Wound #1 Right Shoulder: Clean wound with Normal Saline. Cleanse wound with mild soap and water May Shower, gently pat wound dry prior to applying new dressing. Wound #2 Right Trochanter: Clean wound with Normal Saline. Cleanse wound with mild soap and water May Shower, gently pat wound dry prior to applying new dressing. Wound #3 Left Trochanter: Clean  wound with Normal Saline. Cleanse wound with mild soap and water May Shower, gently pat wound dry prior to applying new dressing. Wound #4 Left Toe Great: Clean wound with Normal Saline. Cleanse wound with mild soap and water May Shower, gently pat wound dry prior to applying new dressing. Anesthetic (add to Medication List): Wound #1 Right Shoulder: Topical Lidocaine 4% cream applied to wound bed prior to debridement (In Clinic Only). Wound #2 Right Trochanter: Topical Lidocaine 4% cream applied to wound bed prior to debridement (In Clinic Only). Primary Wound Dressing: Wound #1 Right Shoulder: Medihoney gel Wound #2 Right Trochanter: Medihoney gel Wound #4 Left Toe Great: Medihoney gel Secondary Dressing: Wound #1 Right Shoulder: Boardered Foam Dressing Wound #2 Right Trochanter: Boardered Foam Dressing Wound #3 Left Trochanter: Boardered Foam Dressing Wound #4 Left Toe Great: Boardered Foam Dressing Dressing Change Frequency: Wound #1 Right Shoulder: Change Dressing Monday, Wednesday, Friday Wound #2 Right Trochanter: Change Dressing Monday, Wednesday, Friday Wound #3 Left Trochanter: Change Dressing Monday, Wednesday, Friday Wound #4 Left Toe Great: Change Dressing Monday, Wednesday, Friday Follow-up Appointments: Wound #1 Right Shoulder: Return Appointment in 1 week. Wound #2 Right Trochanter: Return Appointment in 1 week. Wound #3 Left Trochanter: Return Appointment in 1 week. Wound #4 Left Toe Great: Return Appointment in 1 week. Off-Loading: Wound #1 Right Shoulder: Mattress Turn and reposition every 2 hours Other: - hospital bed Shawn Sweeney, Shawn Sweeney ([id number]) Wound #2 Right Trochanter: Mattress Turn and reposition every 2 hours Other: - hospital bed Wound #3 Left Trochanter: Mattress Turn and reposition every 2 hours Other: - hospital bed Wound #4 Left Toe Great: Mattress Turn and reposition every 2 hours Other: - hospital bed Additional Orders /  Instructions: Wound #1 Right Shoulder: Vitamin A; Vitamin C, Zinc Increase protein intake. Wound #2 Right Trochanter: Vitamin A; Vitamin C, Zinc Increase protein intake. Wound #3 Left Trochanter: Vitamin A; Vitamin C, Zinc Increase protein intake. Wound #4 Left Toe Great: Vitamin A; Vitamin C, Zinc Increase protein intake. The following medication(s) was prescribed: lidocaine topical 4 % cream 1 1 cream topical was prescribed at facility Electronic Signature(s) Signed: 12/17/2017 5:22:35 PM By: Lawanda Cousins Entered By: Lawanda Cousins on 12/17/2017 17:22:35 Shawn Sweeney ([id number]) -------------------------------------------------------------------------------- ROS/PFSH Details Patient Name: Shawn Sweeney,  Shawn L. Date of Service: 12/17/2017 12:30 PM Medical Record Number: [id number] Patient Account Number: [id number] Date of Birth/Sex: 1956/02/17 (62 y.o. M) Treating RN: Montey Hora Primary Care Provider: Ramonita Lab Other Clinician: Referring Provider: Ramonita Lab Treating Provider/Extender: Cathie Olden in Treatment: 0 Information Obtained From Caregiver Wound History Do you currently have one or more open woundso Yes How many open wounds do you currently haveo 2 Approximately how long have you had your woundso 3 weeks How have you been treating your wound(s) until nowo gauze Has your wound(s) ever healed and then re-openedo No Have you had any lab work done in the past montho No Have you tested positive for an antibiotic resistant organism (MRSA, VRE)o No Have you tested positive for osteomyelitis (bone infection)o No Have you had any tests for circulation on your legso No Constitutional Symptoms (General Health) Complaints and Symptoms: Negative for: Fatigue; Fever; Chills; Marked Weight Change Eyes Complaints and Symptoms: Negative for: Dry Eyes; Vision Changes; Glasses / Contacts Medical History: Negative for: Cataracts; Glaucoma; Optic Neuritis Past Medical  History Notes: blind Ear/Nose/Mouth/Throat Complaints and Symptoms: Negative for: Difficult clearing ears; Sinusitis Medical History: Negative for: Chronic sinus problems/congestion; Middle ear problems Past Medical History Notes: deaf, 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 ([id number]) Complaints and Symptoms: Negative for: Chronic or frequent coughs; Shortness of Breath Medical History: Negative for: Aspiration; Asthma; Chronic Obstructive Pulmonary Disease (COPD); Pneumothorax; Sleep Apnea; Tuberculosis Cardiovascular Complaints and Symptoms: Positive for: LE edema Negative for: Chest pain Medical History: Positive for: Hypertension Negative for: Angina; Arrhythmia; Congestive Heart Failure; Coronary Artery Disease; Deep Vein Thrombosis; Hypotension; Myocardial Infarction; Peripheral Arterial Disease; Peripheral Venous Disease; Phlebitis; Vasculitis Gastrointestinal Complaints and Symptoms: Negative for: Frequent diarrhea; Nausea; Vomiting Medical History: Negative for: Cirrhosis ; Colitis; Crohnos; Hepatitis A; Hepatitis B; Hepatitis C Past Medical History Notes: manutrition Endocrine Complaints and Symptoms: Negative for: Hepatitis; Thyroid disease; Polydypsia (Excessive Thirst) Medical History: Negative for: Type I Diabetes; Type II Diabetes Genitourinary Complaints and Symptoms: Positive for: Incontinence/dribbling Negative for: Kidney failure/ Dialysis Medical History: Negative for: End Stage Renal Disease Past Medical History Notes: congenital phimosis Immunological Complaints and Symptoms: Negative for: Hives; Itching Medical History: Negative for: Lupus Erythematosus; Raynaudos;  Scleroderma Integumentary (Skin) Complaints and Symptoms: Positive for: Wounds; Breakdown Negative for: Bleeding or bruising tendency; Swelling THANIEL, COLUCCIO ([id number]) Medical History: Negative for: History of Burn; History of pressure wounds Musculoskeletal Complaints and Symptoms: Negative for: Muscle Pain; Muscle Weakness Medical History: Negative for: Gout; Rheumatoid Arthritis; Osteoarthritis; Osteomyelitis Past Medical History Notes: contracted Neurologic Complaints and Symptoms: Negative for: Numbness/parasthesias; Focal/Weakness Medical History: Negative for: Dementia; Neuropathy; Quadriplegia; Paraplegia; Seizure Disorder Past Medical History Notes: congenital diplegia Oncologic Medical History: Negative for: Received Chemotherapy; Received Radiation Psychiatric Medical History: Past Medical History Notes: mood disorder Immunizations Pneumococcal Vaccine: Received Pneumococcal Vaccination: Yes Immunization Notes: up to date Implantable Devices Family and Social History Never smoker; Marital Status - Single; Alcohol Use: Never; Drug Use: No History; Caffeine Use: Never; Financial Concerns: No; Food, Clothing or Shelter Needs: No; Support System Lacking: No; Transportation Concerns: No; Advanced Directives: No; Patient does not want information on Advanced Directives; Medical Power of Attorney: Yes - Drury Ardizzone is guardian Notes unknown family history Electronic Signature(s) Signed: 12/17/2017 4:12:59 PM By: Montey Hora Signed: 12/17/2017 5:32:34 PM By: Lawanda Cousins Entered By: Marjory Lies,  Joanna on 12/17/2017 13:03:42 SUNG, PARODI ([id number]) -------------------------------------------------------------------------------- SuperBill Details Patient Name: CAYLEB, JARNIGAN. Date of Service: 12/17/2017 Medical Record Number: [id number] Patient Account Number: [id number] Date of Birth/Sex: June 10, 1956 (62 y.o. M) Treating RN: Ahmed Prima Primary Care  Provider: Ramonita Lab Other Clinician: Referring Provider: Ramonita Lab Treating Provider/Extender: Cathie Olden in Treatment: 0 Diagnosis Coding ICD-10 Codes Code Description [phone] Pressure ulcer of right hip, stage 3 L89.93 Pressure ulcer of unspecified site, stage 3 L89.221 Pressure ulcer of left hip, stage 1 S91.102S Unspecified open wound of left great toe without damage to nail, sequela R64 Cachexia Facility Procedures CPT4 Code: [id number] Description: [place] VISIT-LEV 3 EST PT Modifier: Quantity: 1 CPT4 Code: [id number] Description: 11042 - DEB SUBQ TISSUE 20 SQ CM/< ICD-10 Diagnosis Description L89.213 Pressure ulcer of right hip, stage 3 L89.93 Pressure ulcer of unspecified site, stage 3 Modifier: Quantity: 1 Physician Procedures CPT4 Code: [id number] Description: 31517 - WC PHYS LEVEL 3 - EST PT ICD-10 Diagnosis Description L89.213 Pressure ulcer of right hip, stage 3 L89.93 Pressure ulcer of unspecified site, stage 3 L89.221 Pressure ulcer of left hip, stage 1 S91.102S Unspecified open wound of left  great toe without damage to Modifier: nail, sequela Quantity: 1 CPT4 Code: [id number] Description: 11042 - WC PHYS SUBQ TISS 20 SQ CM ICD-10 Diagnosis Description L89.213 Pressure ulcer of right hip, stage 3 L89.93 Pressure ulcer of unspecified site, stage 3 Modifier: Quantity: 1 Electronic Signature(s) Signed: 12/17/2017 5:23:57 PM By: Lawanda Cousins Entered By: Lawanda Cousins on 12/17/2017 17:23:57

## 2017-12-29 NOTE — Progress Notes (Signed)
Shawn Sweeney, Shawn Sweeney ([id number]) Visit Report for 12/24/2017 Chief Complaint Document Details Patient Name: Shawn Sweeney, Shawn Sweeney. Date of Service: 12/24/2017 1:45 PM Medical Record Number: [id number] Patient Account Number: [id number] Date of Birth/Sex: Jun 07, 1956 (62 y.o. Male) Treating RN: Ahmed Prima Primary Care Provider: Ramonita Lab Other Clinician: Referring Provider: Ramonita Lab Treating Provider/Extender: Cathie Olden in Treatment: 1 Information Obtained from: Patient Chief Complaint multiple wounds Electronic Signature(s) Signed: 12/24/2017 3:13:24 PM By: Lawanda Cousins Entered By: Lawanda Cousins on 12/24/2017 15:13:24 Shawn Sweeney ([id number]) -------------------------------------------------------------------------------- HPI Details Patient Name: Shawn Sweeney Date of Service: 12/24/2017 1:45 PM Medical Record Number: [id number] Patient Account Number: [id number] Date of Birth/Sex: [birth year] (62 y.o. Male) Treating RN: Ahmed Prima Primary Care Provider: Ramonita Lab Other Clinician: Referring Provider: Ramonita Lab Treating Provider/Extender: Cathie Olden in Treatment: 1 History of Present Illness HPI Description: 12/17/17- He presents for initial evaluation for multiple areas of pressure injury. He resides is a group home and is accompanied by staff that are covering for his regular caregivers. 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 He appears like adult ftt; it is reported that he has had a significant weight loss. I will reach out to the group home staff on Monday for more details Shawn Sweeney [phone]). 12/24/17-He presents for follow-up evaluation for multiple areas of pressure. According to the staff member that is present, there was a meeting to discuss his overall condition, there are plans to evaluate for cancer given his history of cancer and recent weight loss despite a good appetite and supplements provided by the  facility. The left trochanter is progressed to a stage II with areas of deep tissue injury; the sacrococcygeal area has blanchable erythema. We have discussed more appropriate offloading techniques. We will continue with medihoney and he will follow up in two weeks Electronic Signature(s) Signed: 12/24/2017 3:16:07 PM By: Lawanda Cousins Entered By: Lawanda Cousins on 12/24/2017 15:16:07 Shawn Sweeney ([id number]) -------------------------------------------------------------------------------- Physician Orders Details Patient Name: Shawn Sweeney. Date of Service: 12/24/2017 1:45 PM Medical Record Number: [id number] Patient Account Number: [id number] Date of Birth/Sex: 06/11/1956 (62 y.o. Male) Treating RN: Ahmed Prima Primary Care Provider: Ramonita Lab Other Clinician: Referring Provider: Ramonita Lab Treating Provider/Extender: Cathie Olden in Treatment: 1 Verbal / Phone Orders: Yes Clinician: Carolyne Fiscal, Debi Read Back and Verified: Yes Diagnosis Coding Wound Cleansing Wound #1 Right Shoulder o Clean wound with Normal Saline. o Cleanse wound with mild soap and water o May Shower, gently pat wound dry prior to applying new dressing. Wound #2 Right Trochanter o Clean wound with Normal Saline. o Cleanse wound with mild soap and water o May Shower, gently pat wound dry prior to applying new dressing. Wound #3 Left Trochanter o Clean wound with Normal Saline. o Cleanse wound with mild soap and water o May Shower, gently pat wound dry prior to applying new dressing. Wound #4 Left Toe Great o Clean wound with Normal Saline. o Cleanse wound with mild soap and water o May Shower, gently pat wound dry prior to applying new dressing. Anesthetic (add to Medication List) Wound #1 Right Shoulder o Topical Lidocaine 4% cream applied to wound bed prior to debridement (In Clinic Only). Wound #2 Right Trochanter o Topical Lidocaine 4% cream applied to wound  bed prior to debridement (In Clinic Only). Primary Wound Dressing Wound #2 Right Trochanter o Medihoney gel Wound #3 Left Trochanter o Medihoney gel Wound #4 Left Toe Great o Medihoney gel Secondary Dressing Wound #  1 Right Shoulder o Boardered Foam Dressing Wound #2 Right Trochanter o Boardered Foam Dressing Shawn Sweeney, Shawn Sweeney. ([id number]) Wound #3 Left Trochanter o Boardered Foam Dressing Wound #4 Left Toe Great o Boardered Foam Dressing Dressing Change Frequency Wound #1 Right Shoulder o Change Dressing Monday, Wednesday, Friday Wound #2 Right Trochanter o Change Dressing Monday, Wednesday, Friday Wound #3 Left Trochanter o Change Dressing Monday, Wednesday, Friday Wound #4 Left Toe Great o Change Dressing Monday, Wednesday, Friday Follow-up Appointments Wound #1 Right Shoulder o Return Appointment in 2 weeks. Wound #2 Right Trochanter o Return Appointment in 2 weeks. Wound #3 Left Trochanter o Return Appointment in 2 weeks. Wound #4 Left Toe Great o Return Appointment in 2 weeks. Off-Loading Wound #1 Right Shoulder o Mattress o Turn and reposition every 2 hours - turn pt 30 degrees when turning right to left; use pillows to offload/reposition may include back lying in reposition schedule recline when at day program to offload ischial and trochanter ulcers o Other: - hospital bed Wound #2 Right Trochanter o Mattress o Turn and reposition every 2 hours - turn pt 30 degrees when turning right to left; use pillows to offload/reposition may include back lying in reposition schedule recline when at day program to offload ischial and trochanter ulcers o Other: - hospital bed Wound #3 Left Trochanter o Mattress o Turn and reposition every 2 hours - turn pt 30 degrees when turning right to left; use pillows to offload/reposition may include back lying in reposition schedule recline when at day program to offload ischial and  trochanter ulcers o Other: - hospital bed Wound #4 Left Toe Shawn Sweeney, Shawn L. ([id number]) o Mattress o Turn and reposition every 2 hours - turn pt 30 degrees when turning right to left; use pillows to offload/reposition may include back lying in reposition schedule recline when at day program to offload ischial and trochanter ulcers o Other: - hospital bed Additional Orders / Instructions Wound #1 Right Shoulder o Vitamin A; Vitamin C, Zinc o Increase protein intake. Wound #2 Right Trochanter o Vitamin A; Vitamin C, Zinc o Increase protein intake. Wound #3 Left Trochanter o Vitamin A; Vitamin C, Zinc o Increase protein intake. Wound #4 Left Toe Great o Vitamin A; Vitamin C, Zinc o Increase protein intake. Patient Medications Allergies: Levaquin, Risperdal Notifications Medication Indication Start End lidocaine DOSE 1 - topical 4 % cream - 1 cream topical Electronic Signature(s) Signed: 12/24/2017 9:32:27 PM By: Lawanda Cousins Signed: 12/28/2017 5:11:46 PM By: Alric Quan Entered By: Alric Quan on 12/24/2017 15:07:43 Shawn Sweeney ([id number]) -------------------------------------------------------------------------------- Prescription 12/24/2017 Patient Name: Shawn Sweeney. Provider: Lawanda Cousins NP Date of Birth: 1955-10-30 NPI#: [id number] Sex: Male DEA#: [id number] Phone #: [phone] License #: Patient Address: [facility] [place], [place] [address] [address], [place],  [address] [phone] Allergies Levaquin Risperdal Medication Medication: Route: Strength: Form: lidocaine 4 % topical cream topical 4% cream Class: TOPICAL LOCAL ANESTHETICS Dose: Frequency / Time: Indication: 1 1 cream topical Number of Refills: Number of Units: 0 Generic Substitution: Start Date: End Date: One Time Use: Substitution  Permitted No Note to Pharmacy: Signature(s): Date(s): Electronic Signature(s) Signed: 12/24/2017 9:32:27 PM By: Lawanda Cousins Signed: 12/28/2017 5:11:46 PM By: Alric Quan Entered By: Alric Quan on 12/24/2017 15:07:45 Shawn Sweeney ([id number] Gash, Aviva Signs ([id number]) --------------------------------------------------------------------------------  Problem List Details Patient Name: Shawn Sweeney, Shawn Sweeney. Date of Service: 12/24/2017 1:45 PM Medical Record Number: [id number]  Patient Account Number: [id number] Date of Birth/Sex: 22-Dec-1955 (62 y.o. Male) Treating RN: Ahmed Prima Primary Care Provider: Ramonita Lab Other Clinician: Referring Provider: Ramonita Lab Treating Provider/Extender: Cathie Olden in Treatment: 1 Active Problems ICD-10 Evaluated Encounter Code Description Active Date Today Diagnosis L89.213 Pressure ulcer of right hip, stage 3 12/17/2017 No Yes L89.93 Pressure ulcer of unspecified site, stage 3 12/17/2017 No Yes L89.222 Pressure ulcer of left hip, stage 2 12/24/2017 No Yes S91.102S Unspecified open wound of left great toe without damage to 12/17/2017 No Yes nail, sequela R64 Cachexia 12/17/2017 No Yes Inactive Problems Resolved Problems Electronic Signature(s) Signed: 12/24/2017 3:12:04 PM By: Lawanda Cousins Entered By: Lawanda Cousins on 12/24/2017 15:12:03 Shawn Sweeney ([id number]) -------------------------------------------------------------------------------- Progress Note Details Patient Name: Shawn Sweeney. Date of Service: 12/24/2017 1:45 PM Medical Record Number: [id number] Patient Account Number: [id number] Date of Birth/Sex: [birth year] (62 y.o. Male) Treating RN: Ahmed Prima Primary Care Provider: Ramonita Lab Other Clinician: Referring Provider: Ramonita Lab Treating Provider/Extender: Cathie Olden in Treatment: 1 Subjective Chief Complaint Information obtained from Patient multiple wounds History of Present  Illness (HPI) 12/17/17- He presents for initial evaluation for multiple areas of pressure injury. He resides is a group home and is accompanied by staff that are covering for his regular caregivers. 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 He appears like adult ftt; it is reported that he has had a significant weight loss. I will reach out to the group home staff on Monday for more details Shawn Sweeney [phone]). 12/24/17-He presents for follow-up evaluation for multiple areas of pressure. According to the staff member that is present, there was a meeting to discuss his overall condition, there are plans to evaluate for cancer given his history of cancer and recent weight loss despite a good appetite and supplements provided by the facility. The left trochanter is progressed to a stage II with areas of deep tissue injury; the sacrococcygeal area has blanchable erythema. We have discussed more appropriate offloading techniques. We will continue with medihoney and he will follow up in two weeks Patient History Unable to Obtain Patient History due to Altered Mental Status. Information obtained from Patient. Social History Never smoker, Marital Status - Single, Alcohol Use - Never, Drug Use - No History, Caffeine Use - Never. 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 ([id number]) Objective Constitutional Vitals Time Taken: 2:12 PM, Temperature: 97.6 F, Pulse: 65 bpm, Respiratory Rate: 16 breaths/min, Blood Pressure: 112/92 mmHg. Integumentary (Hair, Skin) Wound #1 status is Open. Original cause of wound was Pressure Injury. The wound is located on the Right Shoulder. The wound measures 0.1cm length x 0.1cm width x 0.1cm depth; 0.008cm^2 area  and 0.001cm^3 volume. There is a medium amount of serous drainage noted. The wound margin is flat and intact. There is large (67-100%) pink granulation within the wound bed. There is a small (1-33%) amount of necrotic tissue within the wound bed including Adherent Slough. The periwound skin appearance did not exhibit: Callus, Crepitus, Excoriation, Induration, Rash, Scarring, Dry/Scaly, Maceration, Atrophie Blanche, Cyanosis, Ecchymosis, Hemosiderin Staining, Mottled, Pallor, Rubor, Erythema. Periwound temperature was noted as No Abnormality. Wound #2 status is Open. Original cause of wound was Pressure Injury. The wound is located on the Right Trochanter. The wound measures 0.6cm length x 0.5cm width x 0.1cm depth; 0.236cm^2 area and 0.024cm^3 volume. There is Fat  Layer (Subcutaneous Tissue) Exposed exposed. There is no tunneling or undermining noted. There is a medium amount of serous drainage noted. The wound margin is flat and intact. There is medium (34-66%) pink granulation within the wound bed. There is a medium (34-66%) amount of necrotic tissue within the wound bed. The periwound skin appearance exhibited: Erythema. The periwound skin appearance did not exhibit: Callus, Crepitus, Excoriation, Induration, Rash, Scarring, Dry/Scaly, Maceration, Atrophie Blanche, Cyanosis, Ecchymosis, Hemosiderin Staining, Mottled, Pallor, Rubor. The surrounding wound skin color is noted with erythema which is circumferential. Periwound temperature was noted as No Abnormality. Wound #3 status is Open. Original cause of wound was Pressure Injury. The wound is located on the Left Trochanter. The wound measures 1.2cm length x 2.2cm width x 0.1cm depth; 2.073cm^2 area and 0.207cm^3 volume. There is no tunneling or undermining noted. There is a none present amount of drainage noted. The wound margin is flat and intact. There is no granulation within the wound bed. There is no necrotic tissue within the wound bed.  The periwound skin appearance exhibited: Erythema. The surrounding wound skin color is noted with erythema which is circumferential. Periwound temperature was noted as No Abnormality. Wound #4 status is Open. Original cause of wound was Gradually Appeared. The wound is located on the Left Toe Great. The wound measures 1cm length x 0.6cm width x 0.1cm depth; 0.471cm^2 area and 0.047cm^3 volume. There is no tunneling or undermining noted. There is a medium amount of serous drainage noted. The wound margin is flat and intact. There is small (1-33%) pink granulation within the wound bed. There is a large (67-100%) amount of necrotic tissue within the wound bed including Adherent Slough. The periwound skin appearance exhibited: Maceration. The periwound skin appearance did not exhibit: Callus, Crepitus, Excoriation, Induration, Rash, Scarring. Periwound temperature was noted as No Abnormality. Assessment Active Problems ICD-10 Pressure ulcer of right hip, stage 3 Pressure ulcer of unspecified site, stage 3 Pressure ulcer of left hip, stage 2 Unspecified open wound of left great toe without damage to nail, sequela Cachexia Shawn Sweeney, Shawn Sweeney. ([id number]) Plan Wound Cleansing: Wound #1 Right Shoulder: Clean wound with Normal Saline. Cleanse wound with mild soap and water May Shower, gently pat wound dry prior to applying new dressing. Wound #2 Right Trochanter: Clean wound with Normal Saline. Cleanse wound with mild soap and water May Shower, gently pat wound dry prior to applying new dressing. Wound #3 Left Trochanter: Clean wound with Normal Saline. Cleanse wound with mild soap and water May Shower, gently pat wound dry prior to applying new dressing. Wound #4 Left Toe Great: Clean wound with Normal Saline. Cleanse wound with mild soap and water May Shower, gently pat wound dry prior to applying new dressing. Anesthetic (add to Medication List): Wound #1 Right Shoulder: Topical  Lidocaine 4% cream applied to wound bed prior to debridement (In Clinic Only). Wound #2 Right Trochanter: Topical Lidocaine 4% cream applied to wound bed prior to debridement (In Clinic Only). Primary Wound Dressing: Wound #2 Right Trochanter: Medihoney gel Wound #3 Left Trochanter: Medihoney gel Wound #4 Left Toe Great: Medihoney gel Secondary Dressing: Wound #1 Right Shoulder: Boardered Foam Dressing Wound #2 Right Trochanter: Boardered Foam Dressing Wound #3 Left Trochanter: Boardered Foam Dressing Wound #4 Left Toe Great: Boardered Foam Dressing Dressing Change Frequency: Wound #1 Right Shoulder: Change Dressing Monday, Wednesday, Friday Wound #2 Right Trochanter: Change Dressing Monday, Wednesday, Friday Wound #3 Left Trochanter: Change Dressing Monday, Wednesday, Friday Wound #4 Left Toe Great: Change Dressing  Monday, Wednesday, Friday Follow-up Appointments: Wound #1 Right Shoulder: Return Appointment in 2 weeks. Wound #2 Right Trochanter: Return Appointment in 2 weeks. Wound #3 Left Trochanter: Return Appointment in 2 weeks. Shawn Sweeney, Shawn Sweeney ([id number]) Wound #4 Left Toe Great: Return Appointment in 2 weeks. Off-Loading: Wound #1 Right Shoulder: Mattress Turn and reposition every 2 hours - turn pt 30 degrees when turning right to left; use pillows to offload/reposition may include back lying in reposition schedule recline when at day program to offload ischial and trochanter ulcers Other: - hospital bed Wound #2 Right Trochanter: Mattress Turn and reposition every 2 hours - turn pt 30 degrees when turning right to left; use pillows to offload/reposition may include back lying in reposition schedule recline when at day program to offload ischial and trochanter ulcers Other: - hospital bed Wound #3 Left Trochanter: Mattress Turn and reposition every 2 hours - turn pt 30 degrees when turning right to left; use pillows to offload/reposition may include back  lying in reposition schedule recline when at day program to offload ischial and trochanter ulcers Other: - hospital bed Wound #4 Left Toe Great: Mattress Turn and reposition every 2 hours - turn pt 30 degrees when turning right to left; use pillows to offload/reposition may include back lying in reposition schedule recline when at day program to offload ischial and trochanter ulcers Other: - hospital bed Additional Orders / Instructions: Wound #1 Right Shoulder: Vitamin A; Vitamin C, Zinc Increase protein intake. Wound #2 Right Trochanter: Vitamin A; Vitamin C, Zinc Increase protein intake. Wound #3 Left Trochanter: Vitamin A; Vitamin C, Zinc Increase protein intake. Wound #4 Left Toe Great: Vitamin A; Vitamin C, Zinc Increase protein intake. The following medication(s) was prescribed: lidocaine topical 4 % cream 1 1 cream topical was prescribed at facility Electronic Signature(s) Signed: 12/24/2017 3:18:26 PM By: Lawanda Cousins Entered By: Lawanda Cousins on 12/24/2017 15:18:26 Shawn Sweeney ([id number]) -------------------------------------------------------------------------------- ROS/PFSH Details Patient Name: Shawn Sweeney. Date of Service: 12/24/2017 1:45 PM Medical Record Number: [id number] Patient Account Number: [id number] Date of Birth/Sex: 01/20/1956 (62 y.o. Male) Treating RN: Ahmed Prima Primary Care Provider: Ramonita Lab Other Clinician: Referring Provider: Ramonita Lab Treating Provider/Extender: Cathie Olden in Treatment: 1 Unable to Obtain Patient History due to oo Altered Mental Status Information Obtained From Patient Wound History Do you currently have one or more open woundso Yes How many open wounds do you currently haveo 2 Approximately how long have you had your woundso 3 weeks How have you been treating your wound(s) until nowo gauze Has your wound(s) ever healed and then re-openedo No Have you had any lab work done in the past montho  No Have you tested positive for an antibiotic resistant organism (MRSA, VRE)o No Have you tested positive for osteomyelitis (bone infection)o No Have you had any tests for circulation on your legso No Eyes Medical History: Negative for: Cataracts; Glaucoma; Optic Neuritis Past Medical History Notes: blind Ear/Nose/Mouth/Throat Medical History: Negative for: Chronic sinus problems/congestion; Middle ear problems Past Medical History Notes: deaf, unable to speak Hematologic/Lymphatic Medical History: Positive for: Anemia Negative for: Hemophilia; Human Immunodeficiency Virus; Lymphedema; Sickle Cell Disease Respiratory Medical History: Negative for: Aspiration; Asthma; Chronic Obstructive Pulmonary Disease (COPD); Pneumothorax; Sleep Apnea; Tuberculosis Cardiovascular Medical History: Positive for: Hypertension Negative for: Angina; Arrhythmia; Congestive Heart Failure; Coronary Artery Disease; Deep Vein Thrombosis; Hypotension; Myocardial Infarction; Peripheral Arterial Disease; Peripheral Venous Disease; Phlebitis; Vasculitis Shawn Sweeney, Shawn Sweeney. ([id number]) Gastrointestinal Medical History: Negative for: Cirrhosis ; Colitis; Crohnos; Hepatitis A;  Hepatitis B; Hepatitis C Past Medical History Notes: manutrition Endocrine Medical History: Negative for: Type I Diabetes; Type II Diabetes Genitourinary Medical History: Negative for: End Stage Renal Disease Past Medical History Notes: congenital phimosis Immunological Medical History: Negative for: Lupus Erythematosus; Raynaudos; Scleroderma Integumentary (Skin) Medical History: Negative for: History of Burn; History of pressure wounds Musculoskeletal Medical History: Negative for: Gout; Rheumatoid Arthritis; Osteoarthritis; Osteomyelitis Past Medical History Notes: contracted Neurologic Medical History: Negative for: Dementia; Neuropathy; Quadriplegia; Paraplegia; Seizure Disorder Past Medical History Notes: congenital  diplegia Oncologic Medical History: Negative for: Received Chemotherapy; Received Radiation Psychiatric Medical History: Past Medical History Notes: mood disorder Immunizations Pneumococcal Vaccine: Received Pneumococcal Vaccination: Yes Immunization Notes: up to date Shawn Sweeney, Shawn Sweeney ([id number]) Implantable Devices Family and Social History Never smoker; Marital Status - Single; Alcohol Use: Never; Drug Use: No History; Caffeine Use: Never; Financial Concerns: No; Food, Clothing or Shelter Needs: No; Support System Lacking: No; Transportation Concerns: No; Advanced Directives: No; Patient does not want information on Advanced Directives; Medical Power of Attorney: Yes - Kwabena Strutz is guardian (Not Provided) Physician Affirmation I have reviewed and agree with the above information. Electronic Signature(s) Signed: 12/24/2017 9:32:27 PM By: Lawanda Cousins Signed: 12/28/2017 5:11:46 PM By: Alric Quan Entered By: Lawanda Cousins on 12/24/2017 15:16:37 Shawn Sweeney ([id number]) -------------------------------------------------------------------------------- [place] Details Patient Name: Shawn Sweeney. Date of Service: 12/24/2017 Medical Record Number: [id number] Patient Account Number: [id number] Date of Birth/Sex: 06-23-1956 (62 y.o. Male) Treating RN: Ahmed Prima Primary Care Provider: Ramonita Lab Other Clinician: Referring Provider: Ramonita Lab Treating Provider/Extender: Lawanda Cousins Weeks in Treatment: 1 Diagnosis Coding ICD-10 Codes Code Description [phone] Pressure ulcer of right hip, stage 3 L89.93 Pressure ulcer of unspecified site, stage 3 L89.222 Pressure ulcer of left hip, stage 2 S91.102S Unspecified open wound of left great toe without damage to nail, sequela R64 Cachexia Facility Procedures CPT4 Code: [id number] Description: [phone] - WOUND CARE VISIT-LEV 5 EST PT Modifier: Quantity: 1 Physician Procedures CPT4 Code: 5170017 Description: 49449 -  WC PHYS LEVEL 3 - EST PT ICD-10 Diagnosis Description L89.213 Pressure ulcer of right hip, stage 3 L89.93 Pressure ulcer of unspecified site, stage 3 L89.222 Pressure ulcer of left hip, stage 2 S91.102S Unspecified open wound of left  great toe without damage to Modifier: nail, sequela Quantity: 1 Electronic Signature(s) Signed: 12/24/2017 3:18:43 PM By: Lawanda Cousins Entered By: Lawanda Cousins on 12/24/2017 15:18:42

## 2017-12-29 NOTE — Progress Notes (Signed)
GRACYN, SANTILLANES ([id number]) Visit Report for 12/24/2017 Arrival Information Details Patient Name: TERRIAN, SENTELL. Date of Service: 12/24/2017 1:45 PM Medical Record Number: [id number] Patient Account Number: [id number] Date of Birth/Sex: [birth year] (62 y.o. Male) Treating RN: Secundino Ginger Primary Care Jazir Newey: Ramonita Lab Other Clinician: Referring Etter Royall: Ramonita Lab Treating Todrick Siedschlag/Extender: Cathie Olden in Treatment: 1 Visit Information History Since Last Visit Added or deleted any medications: No Patient Arrived: Wheel Chair Any new allergies or adverse reactions: No Arrival Time: 14:06 Had a fall or experienced change in No Accompanied By: caregiver activities of daily living that may affect Transfer Assistance: Manual risk of falls: Patient Identification Verified: Yes Signs or symptoms of abuse/neglect since last visito No Secondary Verification Process Completed: Yes Hospitalized since last visit: No Implantable device outside of the clinic excluding No cellular tissue based products placed in the center since last visit: Has Dressing in Place as Prescribed: Yes Pain Present Now: No Electronic Signature(s) Signed: 12/24/2017 2:55:11 PM By: Secundino Ginger Entered By: Secundino Ginger on 12/24/2017 14:12:06 [name] ([id number]) -------------------------------------------------------------------------------- Clinic Level of Care Assessment Details Patient Name: [name] Date of Service: 12/24/2017 1:45 PM Medical Record Number: [id number] Patient Account Number: [id number] Date of Birth/Sex: October 15, 1955 (62 y.o. Male) Treating RN: Ahmed Prima Primary Care Dafney Farler: Ramonita Lab Other Clinician: Referring Avaree Gilberti: Ramonita Lab Treating Salaya Holtrop/Extender: Cathie Olden in Treatment: 1 Clinic Level of Care Assessment Items TOOL 4 Quantity Score X - Use when only an EandM is performed on FOLLOW-UP visit 1 0 ASSESSMENTS - Nursing Assessment /  Reassessment X - Reassessment of Co-morbidities (includes updates in patient status) 1 10 X- 1 5 Reassessment of Adherence to Treatment Plan ASSESSMENTS - Wound and Skin Assessment / Reassessment []  - Simple Wound Assessment / Reassessment - one wound 0 X- 4 5 Complex Wound Assessment / Reassessment - multiple wounds []  - 0 Dermatologic / Skin Assessment (not related to wound area) ASSESSMENTS - Focused Assessment []  - Circumferential Edema Measurements - multi extremities 0 []  - 0 Nutritional Assessment / Counseling / Intervention []  - 0 Lower Extremity Assessment (monofilament, tuning fork, pulses) []  - 0 Peripheral Arterial Disease Assessment (using hand held doppler) ASSESSMENTS - Ostomy and/or Continence Assessment and Care []  - Incontinence Assessment and Management 0 []  - 0 Ostomy Care Assessment and Management (repouching, etc.) PROCESS - Coordination of Care []  - Simple Patient / Family Education for ongoing care 0 X- 1 20 Complex (extensive) Patient / Family Education for ongoing care X- 1 10 Staff obtains Programmer, systems, Records, Test Results / Process Orders X- 1 10 Staff telephones HHA, Nursing Homes / Clarify orders / etc []  - 0 Routine Transfer to another Facility (non-emergent condition) []  - 0 Routine Hospital Admission (non-emergent condition) []  - 0 New Admissions / Biomedical engineer / Ordering NPWT, Apligraf, etc. []  - 0 Emergency Hospital Admission (emergent condition) X- 1 10 Simple Discharge Coordination DONZEL, ROMACK. ([id number]) []  - 0 Complex (extensive) Discharge Coordination PROCESS - Special Needs []  - Pediatric / Minor Patient Management 0 []  - 0 Isolation Patient Management []  - 0 Hearing / Language / Visual special needs []  - 0 Assessment of Community assistance (transportation, D/C planning, etc.) []  - 0 Additional assistance / Altered mentation []  - 0 Support Surface(s) Assessment (bed, cushion, seat, etc.) INTERVENTIONS -  Wound Cleansing / Measurement []  - Simple Wound Cleansing - one wound 0 X- 4 5 Complex Wound Cleansing - multiple wounds X- 1 5 Wound Imaging (photographs - any  number of wounds) []  - 0 Wound Tracing (instead of photographs) []  - 0 Simple Wound Measurement - one wound X- 4 5 Complex Wound Measurement - multiple wounds INTERVENTIONS - Wound Dressings X - Small Wound Dressing one or multiple wounds 4 10 []  - 0 Medium Wound Dressing one or multiple wounds []  - 0 Large Wound Dressing one or multiple wounds X- 1 5 Application of Medications - topical []  - 0 Application of Medications - injection INTERVENTIONS - Miscellaneous []  - External ear exam 0 []  - 0 Specimen Collection (cultures, biopsies, blood, body fluids, etc.) []  - 0 Specimen(s) / Culture(s) sent or taken to Lab for analysis []  - 0 Patient Transfer (multiple staff / Civil Service fast streamer / Similar devices) []  - 0 Simple Staple / Suture removal (25 or less) []  - 0 Complex Staple / Suture removal (26 or more) []  - 0 Hypo / Hyperglycemic Management (close monitor of Blood Glucose) []  - 0 Ankle / Brachial Index (ABI) - do not check if billed separately X- 1 5 Vital Signs NETANEL, YANNUZZI. ([id number]) Has the patient been seen at the hospital within the last three years: Yes Total Score: 180 Level Of Care: New/Established - Level 5 Electronic Signature(s) Signed: 12/28/2017 5:11:46 PM By: Alric Quan Entered By: Alric Quan on 12/24/2017 Helix, [place]. ([id number]) -------------------------------------------------------------------------------- Encounter Discharge Information Details Patient Name: [name]. Date of Service: 12/24/2017 1:45 PM Medical Record Number: [id number] Patient Account Number: [id number] Date of Birth/Sex: Sep 11, 1955 (62 y.o. Male) Treating RN: Roger Shelter Primary Care Raigan Baria: Ramonita Lab Other Clinician: Referring Ashtin Melichar: Ramonita Lab Treating Sharnetta Gielow/Extender:  Cathie Olden in Treatment: 1 Encounter Discharge Information Items Discharge Condition: Stable Ambulatory Status: Ambulatory Discharge Destination: Home Transportation: Private Auto Schedule Follow-up Appointment: Yes Clinical Summary of Care: Electronic Signature(s) Signed: 12/24/2017 3:34:40 PM By: Roger Shelter Entered By: Roger Shelter on 12/24/2017 15:26:37 [name] ([id number]) -------------------------------------------------------------------------------- Lower Extremity Assessment Details Patient Name: [name]. Date of Service: 12/24/2017 1:45 PM Medical Record Number: [id number] Patient Account Number: [id number] Date of Birth/Sex: 1955/12/13 (62 y.o. Male) Treating RN: Secundino Ginger Primary Care Nija Koopman: Ramonita Lab Other Clinician: Referring Mykle Pascua: Ramonita Lab Treating Jacody Beneke/Extender: Lawanda Cousins Weeks in Treatment: 1 Electronic Signature(s) Signed: 12/24/2017 2:55:11 PM By: Secundino Ginger Entered By: Secundino Ginger on 12/24/2017 14:31:15 [name] ([id number]) -------------------------------------------------------------------------------- Multi Wound Chart Details Patient Name: [name]. Date of Service: 12/24/2017 1:45 PM Medical Record Number: [id number] Patient Account Number: [id number] Date of Birth/Sex: September 24, 1955 (62 y.o. Male) Treating RN: Ahmed Prima Primary Care Annete Ayuso: Ramonita Lab Other Clinician: Referring Annabella Elford: Ramonita Lab Treating Rajinder Mesick/Extender: Lawanda Cousins Weeks in Treatment: 1 Vital Signs Height(in): Pulse(bpm): 65 Weight(lbs): Blood Pressure(mmHg): 112/92 Body Mass Index(BMI): Temperature(F): 97.6 Respiratory Rate 16 (breaths/min): Photos: Wound Location: Right Shoulder Right Trochanter Left Trochanter Wounding Event: Pressure Injury Pressure Injury Pressure Injury Primary Etiology: Pressure Ulcer Pressure Ulcer Pressure Ulcer Comorbid History: Anemia, Hypertension Anemia, Hypertension  Anemia, Hypertension Date Acquired: 09/29/2017 09/29/2017 12/17/2017 Weeks of Treatment: 1 1 1  Wound Status: Open Open Open Measurements L x W x D 0.1x0.1x0.1 0.6x0.5x0.1 0.1x0.1x0.1 (cm) Area (cm) : 0.008 0.236 0.008 Volume (cm) : 0.001 0.024 0.001 % Reduction in Area: 97.10% 64.20% 99.00% % Reduction in Volume: 96.30% 63.60% 98.70% Classification: Category/Stage II Category/Stage III Category/Stage I Exudate Amount: Medium Medium None Present Exudate Type: Serous Serous N/A Exudate Color: amber amber N/A Wound Margin: Flat and Intact Flat and Intact Flat and Intact Granulation Amount: Large (67-100%) Medium (34-66%) None  Present (0%) Granulation Quality: Pink Pink N/A Necrotic Amount: Small (1-33%) Medium (34-66%) None Present (0%) Exposed Structures: Fascia: No Fat Layer (Subcutaneous Fascia: No Fat Layer (Subcutaneous Tissue) Exposed: Yes Fat Layer (Subcutaneous Tissue) Exposed: No Fascia: No Tissue) Exposed: No Tendon: No Tendon: No Tendon: No Muscle: No Muscle: No Muscle: No Joint: No Joint: No Joint: No Bone: No Bone: No Bone: No Epithelialization: Small (1-33%) None None Bumbaugh, Heru L. ([id number]) Periwound Skin Texture: Excoriation: No Excoriation: No No Abnormalities Noted Induration: No Induration: No Callus: No Callus: No Crepitus: No Crepitus: No Rash: No Rash: No Scarring: No Scarring: No Periwound Skin Moisture: Maceration: No Maceration: No No Abnormalities Noted Dry/Scaly: No Dry/Scaly: No Periwound Skin Color: Atrophie Blanche: No Erythema: Yes Erythema: Yes Cyanosis: No Atrophie Blanche: No Ecchymosis: No Cyanosis: No Erythema: No Ecchymosis: No Hemosiderin Staining: No Hemosiderin Staining: No Mottled: No Mottled: No Pallor: No Pallor: No Rubor: No Rubor: No Erythema Location: N/A Circumferential Circumferential Temperature: No Abnormality No Abnormality No Abnormality Tenderness on Palpation: No No No Wound  Preparation: Ulcer Cleansing: Ulcer Cleansing: Ulcer Cleansing: Rinsed/Irrigated with Saline Rinsed/Irrigated with Saline Rinsed/Irrigated with Saline Topical Anesthetic Applied: Topical Anesthetic Applied: Topical Anesthetic Applied: Other: lidocaine 4% Other: lidocaine 4% None Wound Number: 4 N/A N/A Photos: No Photos N/A N/A Wound Location: Left Toe Great N/A N/A Wounding Event: Gradually Appeared N/A N/A Primary Etiology: To be determined N/A N/A Comorbid History: Anemia, Hypertension N/A N/A Date Acquired: 12/17/2017 N/A N/A Weeks of Treatment: 1 N/A N/A Wound Status: Open N/A N/A Measurements L x W x D 1x0.6x0.1 N/A N/A (cm) Area (cm) : 0.471 N/A N/A Volume (cm) : 0.047 N/A N/A % Reduction in Area: 60.00% N/A N/A % Reduction in Volume: 60.20% N/A N/A Classification: Full Thickness Without N/A N/A Exposed Support Structures Exudate Amount: Medium N/A N/A Exudate Type: Serous N/A N/A Exudate Color: amber N/A N/A Wound Margin: Flat and Intact N/A N/A Granulation Amount: Small (1-33%) N/A N/A Granulation Quality: Pink N/A N/A Necrotic Amount: Large (67-100%) N/A N/A Exposed Structures: Fascia: No N/A N/A Fat Layer (Subcutaneous Tissue) Exposed: No Tendon: No Muscle: No Joint: No Bone: No Epithelialization: None N/A N/A Crochet, Rowyn L. ([id number]) Periwound Skin Texture: Excoriation: No N/A N/A Induration: No Callus: No Crepitus: No Rash: No Scarring: No Periwound Skin Moisture: Maceration: Yes N/A N/A Periwound Skin Color: No Abnormalities Noted N/A N/A Erythema Location: N/A N/A N/A Temperature: No Abnormality N/A N/A Tenderness on Palpation: No N/A N/A Wound Preparation: Ulcer Cleansing: N/A N/A Rinsed/Irrigated with Saline Topical Anesthetic Applied: None Treatment Notes Electronic Signature(s) Signed: 12/28/2017 5:11:46 PM By: Alric Quan Entered By: Alric Quan on 12/24/2017 14:53:48 [name]  ([id number]) -------------------------------------------------------------------------------- [place] Details Patient Name: [name]. Date of Service: 12/24/2017 1:45 PM Medical Record Number: [id number] Patient Account Number: [id number] Date of Birth/Sex: 1956/04/22 (62 y.o. Male) Treating RN: Ahmed Prima Primary Care Rihanna Marseille: Ramonita Lab Other Clinician: Referring Tayana Shankle: Ramonita Lab Treating Shadonna Benedick/Extender: Cathie Olden in Treatment: 1 Active Inactive ` Abuse / Safety / Falls / Self Care Management Nursing Diagnoses: Potential for falls Goals: Patient will remain injury free related to falls Date Initiated: 12/17/2017 Target Resolution Date: 04/03/2018 Goal Status: Active Interventions: Assess Activities of Daily Living upon admission and as needed Assess fall risk on admission and as needed Assess: immobility, friction, shearing, incontinence upon admission and as needed Assess impairment of mobility on admission and as needed per policy Assess personal safety and home safety (as indicated) on admission  and as needed Notes: ` Nutrition Nursing Diagnoses: Imbalanced nutrition Potential for alteratiion in Nutrition/Potential for imbalanced nutrition Goals: Patient/caregiver agrees to and verbalizes understanding of need to use nutritional supplements and/or vitamins as prescribed Date Initiated: 12/17/2017 Target Resolution Date: 04/03/2018 Goal Status: Active Interventions: Assess patient nutrition upon admission and as needed per policy Notes: ` Orientation to the Wound Care Program Nursing Diagnoses: Knowledge deficit related to the wound healing center program GAVYN, YBARRA ([id number]) Goals: Patient/caregiver will verbalize understanding of the [place] Date Initiated: 12/17/2017 Target Resolution Date: 01/02/2018 Goal Status: Active Interventions: Provide education on orientation to the wound  center Notes: ` Pain, Acute or Chronic Nursing Diagnoses: Pain, acute or chronic: actual or potential Potential alteration in comfort, pain Goals: Patient/caregiver will verbalize adequate pain control between visits Date Initiated: 12/17/2017 Target Resolution Date: 04/03/2018 Goal Status: Active Interventions: Complete pain assessment as per visit requirements Encourage patient to take pain medications as prescribed Implement pain control techniques (non-pharmaceutical) Provide education on pain management Notes: ` Wound/Skin Impairment Nursing Diagnoses: Impaired tissue integrity Knowledge deficit related to ulceration/compromised skin integrity Goals: Ulcer/skin breakdown will have a volume reduction of 80% by week 12 Date Initiated: 12/17/2017 Target Resolution Date: 03/27/2018 Goal Status: Active Interventions: Assess patient/caregiver ability to perform ulcer/skin care regimen upon admission and as needed Assess ulceration(s) every visit Notes: Electronic Signature(s) Signed: 12/28/2017 5:11:46 PM By: Alric Quan Entered By: Alric Quan on 12/24/2017 14:53:15 [name] ([id number]) Cletus Gash, Aviva Signs ([id number]) -------------------------------------------------------------------------------- Pain Assessment Details Patient Name: [name]. Date of Service: 12/24/2017 1:45 PM Medical Record Number: [id number] Patient Account Number: [id number] Date of Birth/Sex: 1955/10/23 (62 y.o. Male) Treating RN: Secundino Ginger Primary Care Jaspreet Hollings: Ramonita Lab Other Clinician: Referring Satrina Magallanes: Ramonita Lab Treating Khalee Mazo/Extender: Cathie Olden in Treatment: 1 Active Problems Location of Pain Severity and Description of Pain Patient Has Paino Patient Unable to Respond Site Locations Pain Management and Medication Current Pain Management: Electronic Signature(s) Signed: 12/24/2017 2:55:11 PM By: Secundino Ginger Entered By: Secundino Ginger on 12/24/2017  14:12:19 [name] ([id number]) -------------------------------------------------------------------------------- Patient/Caregiver Education Details Patient Name: [name]. Date of Service: 12/24/2017 1:45 PM Medical Record Number: [id number] Patient Account Number: [id number] Date of Birth/Gender: 26-Jan-1956 (62 y.o. Male) Treating RN: Roger Shelter Primary Care Physician: Ramonita Lab Other Clinician: Referring Physician: Ramonita Lab Treating Physician/Extender: Cathie Olden in Treatment: 1 Education Assessment Education Provided To: Patient Education Topics Provided Wound/Skin Impairment: Handouts: Caring for Your Ulcer Methods: Explain/Verbal Responses: State content correctly Electronic Signature(s) Signed: 12/24/2017 3:34:40 PM By: Roger Shelter Entered By: Roger Shelter on 12/24/2017 15:26:49 [name] ([id number]) -------------------------------------------------------------------------------- Wound Assessment Details Patient Name: [name]. Date of Service: 12/24/2017 1:45 PM Medical Record Number: [id number] Patient Account Number: [id number] Date of Birth/Sex: [birth year] (62 y.o. Male) Treating RN: Secundino Ginger Primary Care Arelly Whittenberg: Ramonita Lab Other Clinician: Referring Loral Campi: Ramonita Lab Treating Greene Diodato/Extender: Lawanda Cousins Weeks in Treatment: 1 Wound Status Wound Number: 1 Primary Etiology: Pressure Ulcer Wound Location: Right Shoulder Wound Status: Open Wounding Event: Pressure Injury Comorbid History: Anemia, Hypertension Date Acquired: 09/29/2017 Weeks Of Treatment: 1 Clustered Wound: No Photos Wound Measurements Length: (cm) 0.1 % Reduction in Width: (cm) 0.1 % Reduction in Depth: (cm) 0.1 Epithelializat Area: (cm) 0.008 Volume: (cm) 0.001 Area: 97.1% Volume: 96.3% ion: Small (1-33%) Wound Description Classification: Category/Stage II Foul Odor Afte Wound Margin: Flat and Intact  Slough/Fibrino Exudate Amount: Medium Exudate Type: Serous Exudate Color: amber r Cleansing: No Yes Wound  Bed Granulation Amount: Large (67-100%) Exposed Structure Granulation Quality: Pink Fascia Exposed: No Necrotic Amount: Small (1-33%) Fat Layer (Subcutaneous Tissue) Exposed: No Necrotic Quality: Adherent Slough Tendon Exposed: No Muscle Exposed: No Joint Exposed: No Bone Exposed: No Periwound Skin Texture Texture Color Riese, Kaisen L. ([id number]) No Abnormalities Noted: No No Abnormalities Noted: No Callus: No Atrophie Blanche: No Crepitus: No Cyanosis: No Excoriation: No Ecchymosis: No Induration: No Erythema: No Rash: No Hemosiderin Staining: No Scarring: No Mottled: No Pallor: No Moisture Rubor: No No Abnormalities Noted: No Dry / Scaly: No Temperature / Pain Maceration: No Temperature: No Abnormality Wound Preparation Ulcer Cleansing: Rinsed/Irrigated with Saline Topical Anesthetic Applied: Other: lidocaine 4%, Treatment Notes Wound #1 (Right Shoulder) 1. Cleansed with: Clean wound with Normal Saline 2. Anesthetic Topical Lidocaine 4% cream to wound bed prior to debridement 4. Dressing Applied: Medihoney Gel 5. Secondary Dressing Applied Bordered Foam Dressing Electronic Signature(s) Signed: 12/24/2017 2:55:11 PM By: Secundino Ginger Entered By: Secundino Ginger on 12/24/2017 14:47:29 [name] ([id number]) -------------------------------------------------------------------------------- Wound Assessment Details Patient Name: [name]. Date of Service: 12/24/2017 1:45 PM Medical Record Number: [id number] Patient Account Number: [id number] Date of Birth/Sex: [birth year] (62 y.o. Male) Treating RN: Secundino Ginger Primary Care Dezaray Shibuya: Ramonita Lab Other Clinician: Referring Jajaira Ruis: Ramonita Lab Treating Filimon Miranda/Extender: Lawanda Cousins Weeks in Treatment: 1 Wound Status Wound Number: 2 Primary Etiology: Pressure Ulcer Wound Location: Right  Trochanter Wound Status: Open Wounding Event: Pressure Injury Comorbid History: Anemia, Hypertension Date Acquired: 09/29/2017 Weeks Of Treatment: 1 Clustered Wound: No Photos Wound Measurements Length: (cm) 0.6 % Reduction in Width: (cm) 0.5 % Reduction in Depth: (cm) 0.1 Epithelializat Area: (cm) 0.236 Tunneling: Volume: (cm) 0.024 Undermining: Area: 64.2% Volume: 63.6% ion: None No No Wound Description Classification: Category/Stage III Foul Odor Afte Wound Margin: Flat and Intact Slough/Fibrino Exudate Amount: Medium Exudate Type: Serous Exudate Color: amber r Cleansing: No Yes Wound Bed Granulation Amount: Medium (34-66%) Exposed Structure Granulation Quality: Pink Fascia Exposed: No Necrotic Amount: Medium (34-66%) Fat Layer (Subcutaneous Tissue) Exposed: Yes Tendon Exposed: No Muscle Exposed: No Joint Exposed: No Bone Exposed: No Periwound Skin Texture Texture Color Sausedo, Yul L. ([id number]) No Abnormalities Noted: No No Abnormalities Noted: No Callus: No Atrophie Blanche: No Crepitus: No Cyanosis: No Excoriation: No Ecchymosis: No Induration: No Erythema: Yes Rash: No Erythema Location: Circumferential Scarring: No Hemosiderin Staining: No Mottled: No Moisture Pallor: No No Abnormalities Noted: No Rubor: No Dry / Scaly: No Maceration: No Temperature / Pain Temperature: No Abnormality Wound Preparation Ulcer Cleansing: Rinsed/Irrigated with Saline Topical Anesthetic Applied: Other: lidocaine 4%, Treatment Notes Wound #2 (Right Trochanter) 1. Cleansed with: Clean wound with Normal Saline 2. Anesthetic Topical Lidocaine 4% cream to wound bed prior to debridement 4. Dressing Applied: Medihoney Gel 5. Secondary Dressing Applied Bordered Foam Dressing Electronic Signature(s) Signed: 12/24/2017 2:55:11 PM By: Secundino Ginger Entered By: Secundino Ginger on 12/24/2017 14:49:04 [name]  ([id number]) -------------------------------------------------------------------------------- Wound Assessment Details Patient Name: [name]. Date of Service: 12/24/2017 1:45 PM Medical Record Number: [id number] Patient Account Number: [id number] Date of Birth/Sex: 1956-02-02 (62 y.o. Male) Treating RN: Ahmed Prima Primary Care Niles Ess: Ramonita Lab Other Clinician: Referring Jylian Pappalardo: Ramonita Lab Treating Cayne Yom/Extender: Lawanda Cousins Weeks in Treatment: 1 Wound Status Wound Number: 3 Primary Etiology: Pressure Ulcer Wound Location: Left Trochanter Wound Status: Open Wounding Event: Pressure Injury Comorbid History: Anemia, Hypertension Date Acquired: 12/17/2017 Weeks Of Treatment: 1 Clustered Wound: No Photos Wound Measurements Length: (cm) 1.2 Width: (cm) 2.2 Depth: (cm) 0.1 Area: (cm)  2.073 Volume: (cm) 0.207 % Reduction in Area: -164.1% % Reduction in Volume: -162% Epithelialization: None Tunneling: No Undermining: No Wound Description Classification: Category/Stage I Foul Odo Wound Margin: Flat and Intact Slough/F Exudate Amount: None Present r After Cleansing: No ibrino No Wound Bed Granulation Amount: None Present (0%) Exposed Structure Necrotic Amount: None Present (0%) Fascia Exposed: No Fat Layer (Subcutaneous Tissue) Exposed: No Tendon Exposed: No Muscle Exposed: No Joint Exposed: No Bone Exposed: No Periwound Skin Texture Texture Color No Abnormalities Noted: No No Abnormalities Noted: No Erythema: Yes Moisture FINTAN, GRATER. ([id number]) No Abnormalities Noted: No Erythema Location: Circumferential Temperature / Pain Temperature: No Abnormality Wound Preparation Ulcer Cleansing: Rinsed/Irrigated with Saline Topical Anesthetic Applied: None Treatment Notes Wound #3 (Left Trochanter) 1. Cleansed with: Clean wound with Normal Saline 2. Anesthetic Topical Lidocaine 4% cream to wound bed prior to debridement 4. Dressing  Applied: Medihoney Gel 5. Secondary Dressing Applied Bordered Foam Dressing Electronic Signature(s) Signed: 12/28/2017 5:11:46 PM By: Alric Quan Previous Signature: 12/24/2017 2:55:11 PM Version By: Secundino Ginger Entered By: Alric Quan on 12/24/2017 14:55:40 [name] ([id number]) -------------------------------------------------------------------------------- Wound Assessment Details Patient Name: [name]. Date of Service: 12/24/2017 1:45 PM Medical Record Number: [id number] Patient Account Number: [id number] Date of Birth/Sex: [birth year] (62 y.o. Male) Treating RN: Secundino Ginger Primary Care Alazar Cherian: Ramonita Lab Other Clinician: Referring Nazair Fortenberry: Ramonita Lab Treating Ayrianna Mcginniss/Extender: Lawanda Cousins Weeks in Treatment: 1 Wound Status Wound Number: 4 Primary Etiology: To be determined Wound Location: Left Toe Great Wound Status: Open Wounding Event: Gradually Appeared Comorbid History: Anemia, Hypertension Date Acquired: 12/17/2017 Weeks Of Treatment: 1 Clustered Wound: No Wound Measurements Length: (cm) 1 Width: (cm) 0.6 Depth: (cm) 0.1 Area: (cm) 0.471 Volume: (cm) 0.047 % Reduction in Area: 60% % Reduction in Volume: 60.2% Epithelialization: None Tunneling: No Undermining: No Wound Description Full Thickness Without Exposed Support Foul Odo Classification: Structures Slough/F Wound Margin: Flat and Intact Exudate Medium Amount: Exudate Type: Serous Exudate Color: amber r After Cleansing: No ibrino Yes Wound Bed Granulation Amount: Small (1-33%) Exposed Structure Granulation Quality: Pink Fascia Exposed: No Necrotic Amount: Large (67-100%) Fat Layer (Subcutaneous Tissue) Exposed: No Necrotic Quality: Adherent Slough Tendon Exposed: No Muscle Exposed: No Joint Exposed: No Bone Exposed: No Periwound Skin Texture Texture Color No Abnormalities Noted: No No Abnormalities Noted: No Callus: No Temperature / Pain Crepitus:  No Temperature: No Abnormality Excoriation: No Induration: No Rash: No Scarring: No Moisture No Abnormalities Noted: No Maceration: Yes OTHEL, HOOGENDOORN ([id number]) Wound Preparation Ulcer Cleansing: Rinsed/Irrigated with Saline Topical Anesthetic Applied: None Treatment Notes Wound #4 (Left Toe Great) 1. Cleansed with: Clean wound with Normal Saline 2. Anesthetic Topical Lidocaine 4% cream to wound bed prior to debridement 4. Dressing Applied: Medihoney Gel 5. Secondary Dressing Applied Bordered Foam Dressing Electronic Signature(s) Signed: 12/24/2017 2:55:11 PM By: Secundino Ginger Entered By: Secundino Ginger on 12/24/2017 14:51:27 [name] ([id number]) -------------------------------------------------------------------------------- Vitals Details Patient Name: [name]. Date of Service: 12/24/2017 1:45 PM Medical Record Number: [id number] Patient Account Number: [id number] Date of Birth/Sex: 06-Jun-1956 (62 y.o. Male) Treating RN: Secundino Ginger Primary Care Kieran Arreguin: Ramonita Lab Other Clinician: Referring Elba Schaber: Ramonita Lab Treating Barb Shear/Extender: Lawanda Cousins Weeks in Treatment: 1 Vital Signs Time Taken: 14:12 Temperature (F): 97.6 Pulse (bpm): 65 Respiratory Rate (breaths/min): 16 Blood Pressure (mmHg): 112/92 Reference Range: 80 - 120 mg / dl Electronic Signature(s) Signed: 12/24/2017 2:55:11 PM By: Secundino Ginger Entered BySecundino Ginger on 12/24/2017 14:15:29

## 2018-01-07 ENCOUNTER — Encounter: Payer: Medicare Other | Attending: Nurse Practitioner | Admitting: Nurse Practitioner

## 2018-01-07 DIAGNOSIS — J449 Chronic obstructive pulmonary disease, unspecified: Secondary | ICD-10-CM | POA: Diagnosis not present

## 2018-01-07 DIAGNOSIS — I10 Essential (primary) hypertension: Secondary | ICD-10-CM | POA: Insufficient documentation

## 2018-01-07 DIAGNOSIS — L89210 Pressure ulcer of right hip, unstageable: Secondary | ICD-10-CM | POA: Insufficient documentation

## 2018-01-07 DIAGNOSIS — Z881 Allergy status to other antibiotic agents status: Secondary | ICD-10-CM | POA: Diagnosis not present

## 2018-01-07 DIAGNOSIS — H913 Deaf nonspeaking, not elsewhere classified: Secondary | ICD-10-CM | POA: Diagnosis not present

## 2018-01-07 DIAGNOSIS — G808 Other cerebral palsy: Secondary | ICD-10-CM | POA: Insufficient documentation

## 2018-01-07 DIAGNOSIS — R64 Cachexia: Secondary | ICD-10-CM | POA: Insufficient documentation

## 2018-01-07 DIAGNOSIS — L89220 Pressure ulcer of left hip, unstageable: Secondary | ICD-10-CM | POA: Insufficient documentation

## 2018-01-09 NOTE — Progress Notes (Signed)
CAYLAN, SCHIFANO ([id number]) Visit Report for 01/07/2018 Arrival Information Details Patient Name: Shawn, Sweeney. Date of Service: 01/07/2018 9:00 AM Medical Record Number: [id number] Patient Account Number: [id number] Date of Birth/Sex: 08/11/1955 (62 y.o. M) Treating RN: Secundino Ginger Primary Care Akeira Lahm: Ramonita Lab Other Clinician: Referring Kassim Guertin: Ramonita Lab Treating Rishawn Walck/Extender: Cathie Olden in Treatment: 3 Visit Information History Since Last Visit Added or deleted any medications: No Patient Arrived: Wheel Chair Any new allergies or adverse reactions: No Arrival Time: 09:15 Had a fall or experienced change in No Accompanied By: caregiver activities of daily living that may affect Transfer Assistance: Other risk of falls: Patient Identification Verified: Yes Signs or symptoms of abuse/neglect since last visito No Secondary Verification Process Completed: Yes Hospitalized since last visit: No Implantable device outside of the clinic excluding No cellular tissue based products placed in the center since last visit: Has Dressing in Place as Prescribed: Yes Pain Present Now: No Electronic Signature(s) Signed: 01/07/2018 3:51:06 PM By: Secundino Ginger Entered By: Secundino Ginger on 01/07/2018 09:16:07 Shawn Sweeney ([id number]) -------------------------------------------------------------------------------- Encounter Discharge Information Details Patient Name: Shawn Sweeney. Date of Service: 01/07/2018 9:00 AM Medical Record Number: [id number] Patient Account Number: [id number] Date of Birth/Sex: 08/29/1955 (62 y.o. M) Treating RN: Roger Shelter Primary Care Isabellarose Kope: Ramonita Lab Other Clinician: Referring Adair Lauderback: Ramonita Lab Treating Shaniqua Guillot/Extender: Cathie Olden in Treatment: 3 Encounter Discharge Information Items Discharge Condition: Stable Ambulatory Status: Wheelchair Discharge Destination: [place] Telephoned: No Orders Sent:  Yes Transportation: Other Accompanied By: caregiver Schedule Follow-up Appointment: Yes Clinical Summary of Care: Electronic Signature(s) Signed: 01/08/2018 10:58:55 AM By: Roger Shelter Entered By: Roger Shelter on 01/07/2018 10:25:22 Shawn Sweeney ([id number]) -------------------------------------------------------------------------------- Lower Extremity Assessment Details Patient Name: Shawn Sweeney. Date of Service: 01/07/2018 9:00 AM Medical Record Number: [id number] Patient Account Number: [id number] Date of Birth/Sex: 07-17-1955 (62 y.o. M) Treating RN: Secundino Ginger Primary Care Tywon Niday: Ramonita Lab Other Clinician: Referring Jasmyn Picha: Ramonita Lab Treating Kalisha Keadle/Extender: Cathie Olden in Treatment: 3 Edema Assessment Assessed: [Left: No] [Right: No] Edema: [Left: N] [Right: o] Vascular Assessment Claudication: Claudication Assessment [Left:None] Pulses: Dorsalis Pedis Palpable: [Left:Yes] Posterior Tibial Extremity colors, hair growth, and conditions: Extremity Color: [Left:Normal] Hair Growth on Extremity: [Left:Yes] Temperature of Extremity: [Left:Cool] Capillary Refill: [Left:> 3 seconds] Toe Nail Assessment Left: Right: Thick: Yes Discolored: Yes Deformed: Yes Improper Length and Hygiene: Yes Electronic Signature(s) Signed: 01/07/2018 3:51:06 PM By: Secundino Ginger Entered By: Secundino Ginger on 01/07/2018 09:42:36 Shawn Sweeney ([id number]) -------------------------------------------------------------------------------- Multi Wound Chart Details Patient Name: Shawn Sweeney. Date of Service: 01/07/2018 9:00 AM Medical Record Number: [id number] Patient Account Number: [id number] Date of Birth/Sex: 1955/09/21 (62 y.o. M) Treating RN: Ahmed Prima Primary Care Clema Skousen: Ramonita Lab Other Clinician: Referring Aracely Rickett: Ramonita Lab Treating Janalynn Eder/Extender: Cathie Olden in Treatment: 3 Vital Signs Height(in): Pulse(bpm):  78 Weight(lbs): Blood Pressure(mmHg): 109/96 Body Mass Index(BMI): Temperature(F): 97.6 Respiratory Rate 16 (breaths/min): Photos: [3:No Photos] Wound Location: Right Shoulder Right Trochanter Left Trochanter Wounding Event: Pressure Injury Pressure Injury Pressure Injury Primary Etiology: Pressure Ulcer Pressure Ulcer Pressure Ulcer Comorbid History: Anemia, Hypertension Anemia, Hypertension Anemia, Hypertension Date Acquired: 09/29/2017 09/29/2017 12/17/2017 Weeks of Treatment: 3 3 3  Wound Status: Healed - Epithelialized Open Open Measurements L x W x D 0x0x0 1.3x0.4x0.1 0.1x0.1x0.1 (cm) Area (cm) : 0 0.408 0.008 Volume (cm) : 0 0.041 0.001 % Reduction in Area: 100.00% 38.20% 99.00% % Reduction in Volume: 100.00% 37.90% 98.70% Classification: Category/Stage II Category/Stage III Category/Stage I Exudate Amount:  N/A N/A N/A Exudate Type: N/A N/A N/A Exudate Color: N/A N/A N/A Wound Margin: N/A N/A N/A Granulation Amount: N/A N/A N/A Necrotic Amount: N/A N/A N/A Epithelialization: N/A N/A N/A Debridement: N/A N/A N/A Pain Control: N/A N/A N/A Tissue Debrided: N/A N/A N/A Level: N/A N/A N/A Debridement Area (sq cm): N/A N/A N/A Instrument: N/A N/A N/A Bleeding: N/A N/A N/A Shawn Sweeney, Shawn Sweeney ([id number]) Hemostasis Achieved: N/A N/A N/A Procedural Pain: N/A N/A N/A Post Procedural Pain: N/A N/A N/A Debridement Treatment N/A N/A N/A Response: Post Debridement N/A N/A N/A Measurements L x W x D (cm) Post Debridement Volume: N/A N/A N/A (cm) Periwound Skin Texture: No Abnormalities Noted No Abnormalities Noted No Abnormalities Noted Periwound Skin Moisture: No Abnormalities Noted No Abnormalities Noted No Abnormalities Noted Periwound Skin Color: No Abnormalities Noted No Abnormalities Noted No Abnormalities Noted Temperature: N/A N/A N/A Tenderness on Palpation: No No No Wound Preparation: N/A N/A N/A Procedures Performed: N/A N/A N/A Wound Number: 4 N/A  N/A Photos: N/A N/A Wound Location: Left Toe Great N/A N/A Wounding Event: Gradually Appeared N/A N/A Primary Etiology: Atypical N/A N/A Comorbid History: Anemia, Hypertension N/A N/A Date Acquired: 12/17/2017 N/A N/A Weeks of Treatment: 3 N/A N/A Wound Status: Open N/A N/A Measurements L x W x D 1x0.6x0.1 N/A N/A (cm) Area (cm) : 0.471 N/A N/A Volume (cm) : 0.047 N/A N/A % Reduction in Area: 60.00% N/A N/A % Reduction in Volume: 60.20% N/A N/A Classification: Full Thickness Without N/A N/A Exposed Support Structures Exudate Amount: Medium N/A N/A Exudate Type: Serous N/A N/A Exudate Color: amber N/A N/A Wound Margin: Flat and Intact N/A N/A Granulation Amount: None Present (0%) N/A N/A Necrotic Amount: Large (67-100%) N/A N/A Exposed Structures: Fascia: No N/A N/A Fat Layer (Subcutaneous Tissue) Exposed: No Tendon: No Muscle: No Joint: No Bone: No Epithelialization: None N/A N/A Debridement: Debridement - Excisional N/A N/A JABARIE, POP ([id number]) Pre-procedure 09:56 N/A N/A Verification/Time Out Taken: Pain Control: Lidocaine 4% Topical Solution N/A N/A Tissue Debrided: Subcutaneous, Slough N/A N/A Level: Skin/Subcutaneous Tissue N/A N/A Debridement Area (sq cm): 0.6 N/A N/A Instrument: Curette N/A N/A Bleeding: Minimum N/A N/A Hemostasis Achieved: Pressure N/A N/A Procedural Pain: 0 N/A N/A Post Procedural Pain: 0 N/A N/A Debridement Treatment Procedure was tolerated well N/A N/A Response: Post Debridement 1x0.6x0.2 N/A N/A Measurements L x W x D (cm) Post Debridement Volume: 0.094 N/A N/A (cm) Periwound Skin Texture: Excoriation: No N/A N/A Induration: No Callus: No Crepitus: No Rash: No Scarring: No Periwound Skin Moisture: Maceration: Yes N/A N/A Periwound Skin Color: No Abnormalities Noted N/A N/A Temperature: No Abnormality N/A N/A Tenderness on Palpation: No N/A N/A Wound Preparation: Ulcer Cleansing: N/A N/A Rinsed/Irrigated with  Saline Topical Anesthetic Applied: None Procedures Performed: Debridement N/A N/A Treatment Notes Wound #2 (Right Trochanter) 1. Cleansed with: Clean wound with Normal Saline 2. Anesthetic Topical Lidocaine 4% cream to wound bed prior to debridement 4. Dressing Applied: Santyl Ointment 5. Secondary Dressing Applied Bordered Foam Dressing Dry Gauze Wound #3 (Left Trochanter) 5. Secondary Dressing Applied Bordered Foam Dressing Wound #4 (Left Toe Great) 1. Cleansed with: Clean wound with Normal Saline Clay, Tylan L. ([id number]) 2. Anesthetic Topical Lidocaine 4% cream to wound bed prior to debridement 4. Dressing Applied: Medihoney Gel 5. Secondary Dressing Applied Bordered Foam Dressing Electronic Signature(s) Signed: 01/07/2018 10:30:03 AM By: Lawanda Cousins Entered By: Lawanda Cousins on 01/07/2018 10:30:02 Shawn Sweeney ([id number]) -------------------------------------------------------------------------------- [place] Details Patient Name: Shawn, Sweeney. Date of  Service: 01/07/2018 9:00 AM Medical Record Number: [id number] Patient Account Number: [id number] Date of Birth/Sex: [birth year] (62 y.o. M) Treating RN: Ahmed Prima Primary Care Carmeline Kowal: Ramonita Lab Other Clinician: Referring Abby Tucholski: Ramonita Lab Treating Delcenia Inman/Extender: Cathie Olden in Treatment: 3 Active Inactive ` Abuse / Safety / Falls / Self Care Management Nursing Diagnoses: Potential for falls Goals: Patient will remain injury free related to falls Date Initiated: 12/17/2017 Target Resolution Date: 04/03/2018 Goal Status: Active Interventions: Assess Activities of Daily Living upon admission and as needed Assess fall risk on admission and as needed Assess: immobility, friction, shearing, incontinence upon admission and as needed Assess impairment of mobility on admission and as needed per policy Assess personal safety and home safety (as indicated) on  admission and as needed Notes: ` Nutrition Nursing Diagnoses: Imbalanced nutrition Potential for alteratiion in Nutrition/Potential for imbalanced nutrition Goals: Patient/caregiver agrees to and verbalizes understanding of need to use nutritional supplements and/or vitamins as prescribed Date Initiated: 12/17/2017 Target Resolution Date: 04/03/2018 Goal Status: Active Interventions: Assess patient nutrition upon admission and as needed per policy Notes: ` Orientation to the Wound Care Program Nursing Diagnoses: Knowledge deficit related to the wound healing center program JANMICHAEL, GIRAUD ([id number]) Goals: Patient/caregiver will verbalize understanding of the [place] Date Initiated: 12/17/2017 Target Resolution Date: 01/02/2018 Goal Status: Active Interventions: Provide education on orientation to the wound center Notes: ` Pain, Acute or Chronic Nursing Diagnoses: Pain, acute or chronic: actual or potential Potential alteration in comfort, pain Goals: Patient/caregiver will verbalize adequate pain control between visits Date Initiated: 12/17/2017 Target Resolution Date: 04/03/2018 Goal Status: Active Interventions: Complete pain assessment as per visit requirements Encourage patient to take pain medications as prescribed Implement pain control techniques (non-pharmaceutical) Provide education on pain management Notes: ` Wound/Skin Impairment Nursing Diagnoses: Impaired tissue integrity Knowledge deficit related to ulceration/compromised skin integrity Goals: Ulcer/skin breakdown will have a volume reduction of 80% by week 12 Date Initiated: 12/17/2017 Target Resolution Date: 03/27/2018 Goal Status: Active Interventions: Assess patient/caregiver ability to perform ulcer/skin care regimen upon admission and as needed Assess ulceration(s) every visit Notes: Electronic Signature(s) Signed: 01/08/2018 11:31:29 AM By: Alric Quan Entered By:  Alric Quan on 01/07/2018 09:55:36 Shawn Sweeney ([id number]) Cletus Gash, [employer] Signs ([id number]) -------------------------------------------------------------------------------- Non-Wound Condition Assessment Details Patient Name: Shawn Sweeney. Date of Service: 01/07/2018 9:00 AM Medical Record Number: [id number] Patient Account Number: [id number] Date of Birth/Sex: March 23, 1956 (62 y.o. M) Treating RN: Secundino Ginger Primary Care Maury Bamba: Ramonita Lab Other Clinician: Referring Eashan Schipani: Ramonita Lab Treating Lawsen Arnott/Extender: Cathie Olden in Treatment: 3 Non-Wound Condition: Condition: Suspected Deep Tissue Injury Location: Other: trochanter Side: Left Periwound Skin Texture Texture Color No Abnormalities Noted: No No Abnormalities Noted: No Moisture No Abnormalities Noted: No Notes 3.4 x 4.0 x 0.1 dark bruised closed tissue area Electronic Signature(s) Signed: 01/07/2018 3:51:06 PM By: Secundino Ginger Entered By: Secundino Ginger on 01/07/2018 09:39:01 Shawn Sweeney ([id number]) -------------------------------------------------------------------------------- Pain Assessment Details Patient Name: Shawn Sweeney. Date of Service: 01/07/2018 9:00 AM Medical Record Number: [id number] Patient Account Number: [id number] Date of Birth/Sex: 03/02/1956 (62 y.o. M) Treating RN: Secundino Ginger Primary Care Cace Osorto: Ramonita Lab Other Clinician: Referring Lalla Laham: Ramonita Lab Treating Dashaun Onstott/Extender: Cathie Olden in Treatment: 3 Active Problems Location of Pain Severity and Description of Pain Patient Has Paino No Site Locations Pain Management and Medication Current Pain Management: Electronic Signature(s) Signed: 01/07/2018 3:51:06 PM By: Secundino Ginger Entered By: Secundino Ginger on 01/07/2018 09:16:35 Shawn Sweeney ([id number]) -------------------------------------------------------------------------------- Patient/Caregiver Education  Details Patient Name: Shawn, Sweeney. Date of  Service: 01/07/2018 9:00 AM Medical Record Number: [id number] Patient Account Number: [id number] Date of Birth/Gender: [birth year] (62 y.o. M) Treating RN: Roger Shelter Primary Care Physician: Ramonita Lab Other Clinician: Referring Physician: Ramonita Lab Treating Physician/Extender: Cathie Olden in Treatment: 3 Education Assessment Education Provided To: Patient Education Topics Provided Wound/Skin Impairment: Handouts: Caring for Your Ulcer Methods: Explain/Verbal Responses: State content correctly Electronic Signature(s) Signed: 01/08/2018 10:58:55 AM By: Roger Shelter Entered By: Roger Shelter on 01/07/2018 10:25:37 Shawn Sweeney ([id number]) -------------------------------------------------------------------------------- Wound Assessment Details Patient Name: Shawn Sweeney. Date of Service: 01/07/2018 9:00 AM Medical Record Number: [id number] Patient Account Number: [id number] Date of Birth/Sex: 1956-02-20 (62 y.o. M) Treating RN: Ahmed Prima Primary Care Koy Lamp: Ramonita Lab Other Clinician: Referring Bridgit Eynon: Ramonita Lab Treating Morrison Mcbryar/Extender: Lawanda Cousins Weeks in Treatment: 3 Wound Status Wound Number: 1 Primary Etiology: Pressure Ulcer Wound Location: Right Shoulder Wound Status: Healed - Epithelialized Wounding Event: Pressure Injury Comorbid History: Anemia, Hypertension Date Acquired: 09/29/2017 Weeks Of Treatment: 3 Clustered Wound: No Photos Photo Uploaded By: Secundino Ginger on 01/07/2018 09:54:42 Wound Measurements Length: (cm) 0 % Width: (cm) 0 % Depth: (cm) 0 Area: (cm) 0 Volume: (cm) 0 Reduction in Area: 100% Reduction in Volume: 100% Wound Description Classification: Category/Stage II Periwound Skin Texture Texture Color No Abnormalities Noted: No No Abnormalities Noted: No Moisture No Abnormalities Noted: No Electronic Signature(s) Signed: 01/08/2018 11:31:29 AM By: Alric Quan Entered By: Alric Quan on  01/07/2018 10:04:28 Shawn Sweeney ([id number]) -------------------------------------------------------------------------------- Wound Assessment Details Patient Name: Shawn Sweeney. Date of Service: 01/07/2018 9:00 AM Medical Record Number: [id number] Patient Account Number: [id number] Date of Birth/Sex: May 26, 1956 (62 y.o. M) Treating RN: Secundino Ginger Primary Care Nikkia Devoss: Ramonita Lab Other Clinician: Referring Harlowe Dowler: Ramonita Lab Treating Anaijah Augsburger/Extender: Cathie Olden in Treatment: 3 Wound Status Wound Number: 2 Primary Etiology: Pressure Ulcer Wound Location: Right Trochanter Wound Status: Open Wounding Event: Pressure Injury Comorbid History: Anemia, Hypertension Date Acquired: 09/29/2017 Weeks Of Treatment: 3 Clustered Wound: No Photos Wound Measurements Length: (cm) 1.3 Width: (cm) 0.4 Depth: (cm) 0.1 Area: (cm) 0.408 Volume: (cm) 0.041 % Reduction in Area: 38.2% % Reduction in Volume: 37.9% Wound Description Classification: Category/Stage III Wound Margin: Distinct, outline attached Exudate Amount: Large Exudate Type: Serous Exudate Color: amber Foul Odor After Cleansing: No Slough/Fibrino No Wound Bed Granulation Amount: None Present (0%) Necrotic Amount: Large (67-100%) Necrotic Quality: Eschar, Adherent Slough Periwound Skin Texture Texture Color No Abnormalities Noted: No No Abnormalities Noted: No Erythema: Yes Moisture Erythema Location: Circumferential No Abnormalities Noted: No Temperature / Pain Lafoy, Colleen L. ([id number]) Temperature: No Abnormality Tenderness on Palpation: Yes Wound Preparation Ulcer Cleansing: Rinsed/Irrigated with Saline Topical Anesthetic Applied: Other: lidocaine 4%, Treatment Notes Wound #2 (Right Trochanter) 1. Cleansed with: Clean wound with Normal Saline 2. Anesthetic Topical Lidocaine 4% cream to wound bed prior to debridement 4. Dressing Applied: Santyl Ointment 5. Secondary Dressing  Applied Bordered Foam Dressing Dry Gauze Electronic Signature(s) Signed: 01/07/2018 4:47:32 PM By: Alric Quan Signed: 01/08/2018 9:54:41 AM By: Secundino Ginger Previous Signature: 01/07/2018 3:51:06 PM Version By: Secundino Ginger Entered By: Alric Quan on 01/07/2018 16:47:31 [place], [employer] Signs ([id number]) -------------------------------------------------------------------------------- Wound Assessment Details Patient Name: Shawn Sweeney, Shawn Sweeney. Date of Service: 01/07/2018 9:00 AM Medical Record Number: [id number] Patient Account Number: [id number] Date of Birth/Sex: 03-23-1956 (62 y.o. M) Treating RN: Secundino Ginger Primary Care Octavie Westerhold: Ramonita Lab Other Clinician: Referring Mckyla Deckman: Ramonita Lab Treating Lenora Gomes/Extender: Lawanda Cousins Weeks in Treatment: 3 Wound  Status Wound Number: 3 Primary Etiology: Pressure Ulcer Wound Location: Left Trochanter Wound Status: Open Wounding Event: Pressure Injury Comorbid History: Anemia, Hypertension Date Acquired: 12/17/2017 Weeks Of Treatment: 3 Clustered Wound: No Photos Photo Uploaded By: Secundino Ginger on 01/07/2018 11:06:15 Wound Measurements Length: (cm) 0.1 % Reductio Width: (cm) 0.1 % Reductio Depth: (cm) 0.1 Area: (cm) 0.008 Volume: (cm) 0.001 n in Area: 99% n in Volume: 98.7% Wound Description Classification: Category/Stage I Periwound Skin Texture Texture Color No Abnormalities Noted: No No Abnormalities Noted: No Moisture No Abnormalities Noted: No Treatment Notes Wound #3 (Left Trochanter) 5. Secondary Dressing Applied Bordered Foam Dressing Electronic Signature(s) Signed: 01/07/2018 3:51:06 PM By: Maura Crandall ([id number]) Entered By: Secundino Ginger on 01/07/2018 09:40:29 Shawn Sweeney ([id number]) -------------------------------------------------------------------------------- Wound Assessment Details Patient Name: Shawn Sweeney, Shawn Sweeney. Date of Service: 01/07/2018 9:00 AM Medical Record Number: [id number] Patient  Account Number: [id number] Date of Birth/Sex: 06/23/1956 (62 y.o. M) Treating RN: Secundino Ginger Primary Care Marc Sivertsen: Ramonita Lab Other Clinician: Referring Lianni Kanaan: Ramonita Lab Treating Tyge Somers/Extender: Cathie Olden in Treatment: 3 Wound Status Wound Number: 4 Primary Etiology: Atypical Wound Location: Left Toe Great Wound Status: Open Wounding Event: Gradually Appeared Comorbid History: Anemia, Hypertension Date Acquired: 12/17/2017 Weeks Of Treatment: 3 Clustered Wound: No Photos Wound Measurements Length: (cm) 1 Width: (cm) 0.6 Depth: (cm) 0.1 Area: (cm) 0.471 Volume: (cm) 0.047 % Reduction in Area: 60% % Reduction in Volume: 60.2% Epithelialization: None Tunneling: No Undermining: No Wound Description Full Thickness Without Exposed Support Foul Od Classification: Structures Slough/ Wound Margin: Flat and Intact Exudate Medium Amount: Exudate Type: Serous Exudate Color: amber or After Cleansing: No Fibrino No Wound Bed Granulation Amount: None Present (0%) Exposed Structure Necrotic Amount: Large (67-100%) Fascia Exposed: No Fat Layer (Subcutaneous Tissue) Exposed: No Tendon Exposed: No Muscle Exposed: No Joint Exposed: No Bone Exposed: No Periwound Skin Texture Bornemann, Daxon L. ([id number]) Texture Color No Abnormalities Noted: No No Abnormalities Noted: No Callus: No Temperature / Pain Crepitus: No Temperature: No Abnormality Excoriation: No Induration: No Rash: No Scarring: No Moisture No Abnormalities Noted: No Maceration: Yes Wound Preparation Ulcer Cleansing: Rinsed/Irrigated with Saline Topical Anesthetic Applied: None Treatment Notes Wound #4 (Left Toe Great) 1. Cleansed with: Clean wound with Normal Saline 2. Anesthetic Topical Lidocaine 4% cream to wound bed prior to debridement 4. Dressing Applied: Medihoney Gel 5. Secondary Dressing Applied Bordered Foam Dressing Electronic Signature(s) Signed: 01/07/2018 4:48:30 PM  By: Alric Quan Signed: 01/08/2018 9:54:41 AM By: Secundino Ginger Previous Signature: 01/07/2018 3:51:06 PM Version By: Secundino Ginger Entered By: Alric Quan on 01/07/2018 16:48:29 Shawn Sweeney ([id number]) -------------------------------------------------------------------------------- [place] Details Patient Name: Shawn Sweeney. Date of Service: 01/07/2018 9:00 AM Medical Record Number: [id number] Patient Account Number: [id number] Date of Birth/Sex: January 24, 1956 (62 y.o. M) Treating RN: Secundino Ginger Primary Care Sopheap Basic: Ramonita Lab Other Clinician: Referring Jahquan Klugh: Ramonita Lab Treating Tnya Ades/Extender: Cathie Olden in Treatment: 3 Vital Signs Time Taken: 09:15 Temperature (F): 97.6 Pulse (bpm): 88 Respiratory Rate (breaths/min): 16 Blood Pressure (mmHg): 109/96 Reference Range: 80 - 120 mg / dl Electronic Signature(s) Signed: 01/07/2018 3:51:06 PM By: Secundino Ginger Entered By: Secundino Ginger on 01/07/2018 09:17:10

## 2018-01-09 NOTE — Progress Notes (Signed)
JULEN, RUBERT ([id number]) Visit Report for 01/07/2018 Chief Complaint Document Details Patient Name: NESHAWN, AIRD. Date of Service: 01/07/2018 9:00 AM Medical Record Number: [id number] Patient Account Number: [id number] Date of Birth/Sex: Dec 22, 1955 (62 y.o. M) Treating RN: Ahmed Prima Primary Care Provider: Ramonita Lab Other Clinician: Referring Provider: Ramonita Lab Treating Provider/Extender: Cathie Olden in Treatment: 3 Information Obtained from: Patient Chief Complaint multiple wounds Electronic Signature(s) Signed: 01/07/2018 10:34:08 AM By: Lawanda Cousins Entered By: Lawanda Cousins on 01/07/2018 10:34:08 [name] ([id number]) -------------------------------------------------------------------------------- Debridement Details Patient Name: [name]. Date of Service: 01/07/2018 9:00 AM Medical Record Number: [id number] Patient Account Number: [id number] Date of Birth/Sex: Jan 03, 1956 (62 y.o. M) Treating RN: Ahmed Prima Primary Care Provider: Ramonita Lab Other Clinician: Referring Provider: Ramonita Lab Treating Provider/Extender: Cathie Olden in Treatment: 3 Debridement Performed for Wound #4 Left Toe Great Assessment: Performed By: Physician Lawanda Cousins, NP Debridement Type: Debridement Pre-procedure Verification/Time Yes - 09:56 Out Taken: Start Time: 09:56 Pain Control: Lidocaine 4% Topical Solution Total Area Debrided (L x W): 1 (cm) x 0.6 (cm) = 0.6 (cm) Tissue and other material Viable, Non-Viable, Slough, Subcutaneous, Fibrin/Exudate, Slough debrided: Level: Skin/Subcutaneous Tissue Debridement Description: Excisional Instrument: Curette Bleeding: Minimum Hemostasis Achieved: Pressure End Time: 09:58 Procedural Pain: 0 Post Procedural Pain: 0 Response to Treatment: Procedure was tolerated well Level of Consciousness: Awake and Alert Post Debridement Measurements of Total Wound Length: (cm) 1 Width: (cm) 0.6 Depth:  (cm) 0.2 Volume: (cm) 0.094 Character of Wound/Ulcer Post Debridement: Requires Further Debridement Post Procedure Diagnosis Same as Pre-procedure Electronic Signature(s) Signed: 01/07/2018 9:10:59 PM By: Lawanda Cousins Signed: 01/08/2018 11:31:29 AM By: Alric Quan Entered By: Alric Quan on 01/07/2018 09:57:25 [name] ([id number]) -------------------------------------------------------------------------------- HPI Details Patient Name: [name]. Date of Service: 01/07/2018 9:00 AM Medical Record Number: [id number] Patient Account Number: [id number] Date of Birth/Sex: 09-Feb-1956 (62 y.o. M) Treating RN: Ahmed Prima Primary Care Provider: Ramonita Lab Other Clinician: Referring Provider: Ramonita Lab Treating Provider/Extender: Cathie Olden in Treatment: 3 History of Present Illness HPI Description: 12/17/17- He presents for initial evaluation for multiple areas of pressure injury. He resides is a group home and is accompanied by staff that are covering for his regular caregivers. 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 He appears like adult ftt; it is reported that he has had a significant weight loss. I will reach out to the group home staff on Monday for more details Suanne Marker [phone]). 12/24/17-He presents for follow-up evaluation for multiple areas of pressure. According to the staff member that is present, there was a meeting to discuss his overall condition, there are plans to evaluate for cancer given his history of cancer and recent weight loss despite a good appetite and supplements provided by the facility. The left trochanter is progressed to a stage II with areas of deep tissue injury; the sacrococcygeal area has blanchable erythema. We have discussed more appropriate offloading techniques. We will continue with medihoney and he will follow up in two weeks 01/07/18-He is seen in follow-up evaluation for  multiple areas of pressure. Pressure injuries to the right shoulders have healed. The wound to the left great toe is improved. There continues to be deterioration and new areas of pressure to the bilateral trochanter. A lengthy discussion was had with facility staff regarding offloading, maintaining a more supine position versus lateral offloading. They have received a gel mattress overlay. We discussed the possibility of a palliative change in  his overall health status. According to the staff that is present with him today he has only had a 3 pound weight loss since last October and does not tolerate an increase in quantity. They have been attempting to feed him high-protein diet, they do state that on most recent labs his primary care was expressing concern for dehydration. We will continue with medihoney to the left great toe, protect the left trochanter with foam border and initiate santyl to the right trochanter Electronic Signature(s) Signed: 01/07/2018 11:25:55 AM By: Lawanda Cousins Entered By: Lawanda Cousins on 01/07/2018 11:25:54 [name] ([id number]) -------------------------------------------------------------------------------- Physician Orders Details Patient Name: [name]. Date of Service: 01/07/2018 9:00 AM Medical Record Number: [id number] Patient Account Number: [id number] Date of Birth/Sex: 11-Jul-1955 (62 y.o. M) Treating RN: Ahmed Prima Primary Care Provider: Ramonita Lab Other Clinician: Referring Provider: Ramonita Lab Treating Provider/Extender: Cathie Olden in Treatment: 3 Verbal / Phone Orders: Yes Clinician: Carolyne Fiscal, Debi Read Back and Verified: Yes Diagnosis Coding Wound Cleansing Wound #2 Right Trochanter o Clean wound with Normal Saline. o Cleanse wound with mild soap and water o May Shower, gently pat wound dry prior to applying new dressing. Wound #3 Left Trochanter o Clean wound with Normal Saline. o Cleanse wound with  mild soap and water o May Shower, gently pat wound dry prior to applying new dressing. Wound #4 Left Toe Great o Clean wound with Normal Saline. o Cleanse wound with mild soap and water o May Shower, gently pat wound dry prior to applying new dressing. Anesthetic (add to Medication List) Wound #2 Right Trochanter o Topical Lidocaine 4% cream applied to wound bed prior to debridement (In Clinic Only). Primary Wound Dressing Wound #2 Right Trochanter o Saline moistened gauze o Santyl Ointment Wound #4 Left Toe Great o Medihoney gel Secondary Dressing Wound #2 Right Trochanter o Dry Gauze o Foam o [place] Wound #3 Left [place] Wound #4 Left Toe Great o ABD and Kerlix/Conform o Dry Gauze Dressing Change Frequency STEIN, WINDHORST. ([id number]) Wound #2 Right Trochanter o Change dressing every day. Wound #3 Left Trochanter o Change Dressing Monday, Wednesday, Friday Wound #4 Left Toe Great o Change Dressing Monday, Wednesday, Friday Follow-up Appointments Wound #2 Right Trochanter o Return Appointment in 2 weeks. Wound #3 Left Trochanter o Return Appointment in 1 week. Wound #4 Left Toe Great o Return Appointment in 1 week. Off-Loading Wound #2 Right Trochanter o Mattress o Turn and reposition every 2 hours - turn pt 30 degrees when turning right to left; use pillows to offload/reposition may include back lying in reposition schedule recline when at day program to offload ischial and trochanter ulcers o Other: - hospital bed Wound #3 Left Trochanter o Mattress o Turn and reposition every 2 hours - turn pt 30 degrees when turning right to left; use pillows to offload/reposition may include back lying in reposition schedule recline when at day program to offload ischial and trochanter ulcers o Other: - hospital bed Wound #4 Left Toe Great o Mattress o Turn and reposition every 2  hours - turn pt 30 degrees when turning right to left; use pillows to offload/reposition may include back lying in reposition schedule recline when at day program to offload ischial and trochanter ulcers o Other: - hospital bed Additional Orders / Instructions Wound #2 Right Trochanter o Vitamin A; Vitamin C, Zinc o Increase protein intake. o Other: - Please add Benecal to pts food. Wound #3 Left Trochanter   o Vitamin A; Vitamin C, Zinc o Increase protein intake. o Other: - Please add Benecal to pts food. Wound #4 Left Toe Great o Vitamin A; Vitamin C, Zinc o Increase protein intake. o Other: - Please add Benecal to pts food. RIVALDO, HINEMAN ([id number]) Patient Medications Allergies: Levaquin, Risperdal Notifications Medication Indication Start End lidocaine DOSE 1 - topical 4 % cream - 1 cream topical Electronic Signature(s) Signed: 01/07/2018 9:10:59 PM By: Lawanda Cousins Signed: 01/08/2018 11:31:29 AM By: Alric Quan Entered By: Alric Quan on 01/07/2018 10:17:11 [name] ([id number]) -------------------------------------------------------------------------------- Prescription 01/07/2018 Patient Name: [name]. Provider: Lawanda Cousins NP Date of Birth: 1955/09/06 NPI#: [id number] Sex: Jerilynn Mages DEA#: [id number] Phone #: [phone] License #: Patient Address: [facility] [place], [place] [address] [address] [place], [place] [address] [phone] Allergies Levaquin Risperdal Medication Medication: Route: Strength: Form: lidocaine topical 4% cream Class: TOPICAL LOCAL ANESTHETICS Dose: Frequency / Time: Indication: 1 1 cream topical Number of Refills: Number of Units: 0 Generic Substitution: Start Date: End Date: Administered at [facility]: Yes Time Administered: Time Discontinued: Note to  Pharmacy: Signature(s): Date(s): Electronic Signature(s) Signed: 01/07/2018 9:10:59 PM By: Lawanda Cousins Signed: 01/08/2018 11:31:29 AM By: Omelia Blackwater, Aviva Signs ([id number]) Entered By: Alric Quan on 01/07/2018 10:17:12 [name] ([id number]) --------------------------------------------------------------------------------  Problem List Details Patient Name: ALFONZIA, WOOLUM. Date of Service: 01/07/2018 9:00 AM Medical Record Number: [id number] Patient Account Number: [id number] Date of Birth/Sex: 02/17/1956 (62 y.o. M) Treating RN: Ahmed Prima Primary Care Provider: Ramonita Lab Other Clinician: Referring Provider: Ramonita Lab Treating Provider/Extender: Cathie Olden in Treatment: 3 Active Problems ICD-10 Evaluated Encounter Code Description Active Date Today Diagnosis L89.210 Pressure ulcer of right hip, unstageable 01/07/2018 No Yes L89.229 Pressure ulcer of left hip, unspecified stage 01/07/2018 No Yes S91.102S Unspecified open wound of left great toe without damage to 12/17/2017 No Yes nail, sequela R64 Cachexia 12/17/2017 No Yes Inactive Problems Resolved Problems Electronic Signature(s) Signed: 01/07/2018 10:29:56 AM By: Lawanda Cousins Previous Signature: 01/07/2018 10:29:01 AM Version By: Lawanda Cousins Entered By: Lawanda Cousins on 01/07/2018 10:29:55 [name] ([id number]) -------------------------------------------------------------------------------- Progress Note Details Patient Name: [name]. Date of Service: 01/07/2018 9:00 AM Medical Record Number: [id number] Patient Account Number: [id number] Date of Birth/Sex: [birth year] (62 y.o. M) Treating RN: Ahmed Prima Primary Care Provider: Ramonita Lab Other Clinician: Referring Provider: Ramonita Lab Treating Provider/Extender: Cathie Olden in Treatment: 3 Subjective Chief Complaint Information obtained from Patient multiple wounds History of Present Illness  (HPI) 12/17/17- He presents for initial evaluation for multiple areas of pressure injury. He resides is a group home and is accompanied by staff that are covering for his regular caregivers. 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 He appears like adult ftt; it is reported that he has had a significant weight loss. I will reach out to the group home staff on Monday for more details Suanne Marker [phone]). 12/24/17-He presents for follow-up evaluation for multiple areas of pressure. According to the staff member that is present, there was a meeting to discuss his overall condition, there are plans to evaluate for cancer given his history of cancer and recent weight loss despite a good appetite and supplements provided by the facility. The left trochanter is progressed to a stage II with areas of deep tissue injury; the sacrococcygeal area has blanchable erythema. We have discussed more appropriate offloading techniques.  We will continue with medihoney and he will follow up in two weeks 01/07/18-He is seen in follow-up evaluation for multiple areas of pressure. Pressure injuries to the right shoulders have healed. The wound to the left great toe is improved. There continues to be deterioration and new areas of pressure to the bilateral trochanter. A lengthy discussion was had with facility staff regarding offloading, maintaining a more supine position versus lateral offloading. They have received a gel mattress overlay. We discussed the possibility of a palliative change in his overall health status. According to the staff that is present with him today he has only had a 3 pound weight loss since last October and does not tolerate an increase in quantity. They have been attempting to feed him high-protein diet, they do state that on most recent labs his primary care was expressing concern for dehydration. We will continue with medihoney to the left great toe, protect the  left trochanter with foam border and initiate santyl to the right trochanter Patient History Unable to Obtain Patient History due to Altered Mental Status. Information obtained from Patient. Social History Never smoker, Marital Status - Single, Alcohol Use - Never, Drug Use - No History, Caffeine Use - Never. 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 ([id number]) mood disorder Objective Constitutional Vitals Time Taken: 9:15 AM, Temperature: 97.6 F, Pulse: 88 bpm, Respiratory Rate: 16 breaths/min, Blood Pressure: 109/96 mmHg. Integumentary (Hair, Skin) Wound #1 status is Healed - Epithelialized. Original cause of wound was Pressure Injury. The wound is located on the Right Shoulder. The wound measures 0cm length x 0cm width x 0cm depth; 0cm^2 area and 0cm^3 volume. Wound #2 status is Open. Original cause of wound was Pressure Injury. The wound is located on the Right Trochanter. The wound measures 1.3cm length x 0.4cm width x 0.1cm depth; 0.408cm^2 area and 0.041cm^3 volume. There is a large amount of serous drainage noted. The wound margin is distinct with the outline attached to the wound base. There is no granulation within the wound bed. There is a large (67-100%) amount of necrotic tissue within the wound bed including Eschar and Adherent Slough. The periwound skin appearance exhibited: Erythema. The surrounding wound skin color is noted with erythema which is circumferential. Periwound temperature was noted as No Abnormality. The periwound has tenderness on palpation. Wound #3 status is Open. Original cause of wound was Pressure Injury. The wound is located on the Left Trochanter. The wound measures 0.1cm length x 0.1cm width x 0.1cm depth; 0.008cm^2 area and 0.001cm^3  volume. Wound #4 status is Open. Original cause of wound was Gradually Appeared. The wound is located on the Left Toe Great. The wound measures 1cm length x 0.6cm width x 0.1cm depth; 0.471cm^2 area and 0.047cm^3 volume. There is no tunneling or undermining noted. There is a medium amount of serous drainage noted. The wound margin is flat and intact. There is no granulation within the wound bed. There is a large (67-100%) amount of necrotic tissue within the wound bed. The periwound skin appearance exhibited: Maceration. The periwound skin appearance did not exhibit: Callus, Crepitus, Excoriation, Induration, Rash, Scarring. Periwound temperature was noted as No Abnormality. Assessment Active Problems ICD-10 Pressure ulcer of right hip, unstageable Pressure ulcer of left hip, unspecified stage Unspecified open wound of left great toe without damage to nail, sequela Cachexia ALPHUS, ZECK. ([id number]) Procedures Wound #4 Pre-procedure diagnosis of  Wound #4 is an Atypical located on the Left Toe Great . There was a Excisional Skin/Subcutaneous Tissue Debridement with a total area of 0.6 sq cm performed by Lawanda Cousins, NP. With the following instrument(s): Curette to remove Viable and Non-Viable tissue/material. Material removed includes Subcutaneous Tissue, Slough, and Fibrin/Exudate after achieving pain control using Lidocaine 4% Topical Solution. No specimens were taken. A time out was conducted at 09:56, prior to the start of the procedure. A Minimum amount of bleeding was controlled with Pressure. The procedure was tolerated well with a pain level of 0 throughout and a pain level of 0 following the procedure. Patient s Level of Consciousness post procedure was recorded as Awake and Alert. Post Debridement Measurements: 1cm length x 0.6cm width x 0.2cm depth; 0.094cm^3 volume. Character of Wound/Ulcer Post Debridement requires further debridement. Post procedure Diagnosis Wound #4:  Same as Pre-Procedure Plan Wound Cleansing: Wound #2 Right Trochanter: Clean wound with Normal Saline. Cleanse wound with mild soap and water May Shower, gently pat wound dry prior to applying new dressing. Wound #3 Left Trochanter: Clean wound with Normal Saline. Cleanse wound with mild soap and water May Shower, gently pat wound dry prior to applying new dressing. Wound #4 Left Toe Great: Clean wound with Normal Saline. Cleanse wound with mild soap and water May Shower, gently pat wound dry prior to applying new dressing. Anesthetic (add to Medication List): Wound #2 Right Trochanter: Topical Lidocaine 4% cream applied to wound bed prior to debridement (In Clinic Only). Primary Wound Dressing: Wound #2 Right Trochanter: Saline moistened gauze Santyl Ointment Wound #4 Left Toe Great: Medihoney gel Secondary Dressing: Wound #2 Right Trochanter: Dry Gauze Foam [place] Wound #3 Left Trochanter: Foam [place] Wound #4 Left Toe Great: ABD and Kerlix/Conform Dry Gauze Dressing Change Frequency: Wound #2 Right Trochanter: Change dressing every day. ZAKARIE, STURDIVANT ([id number]) Wound #3 Left Trochanter: Change Dressing Monday, Wednesday, Friday Wound #4 Left Toe Great: Change Dressing Monday, Wednesday, Friday Follow-up Appointments: Wound #2 Right Trochanter: Return Appointment in 2 weeks. Wound #3 Left Trochanter: Return Appointment in 1 week. Wound #4 Left Toe Great: Return Appointment in 1 week. Off-Loading: Wound #2 Right Trochanter: Mattress Turn and reposition every 2 hours - turn pt 30 degrees when turning right to left; use pillows to offload/reposition may include back lying in reposition schedule recline when at day program to offload ischial and trochanter ulcers Other: - hospital bed Wound #3 Left Trochanter: Mattress Turn and reposition every 2 hours - turn pt 30 degrees when turning right to left; use pillows to offload/reposition  may include back lying in reposition schedule recline when at day program to offload ischial and trochanter ulcers Other: - hospital bed Wound #4 Left Toe Great: Mattress Turn and reposition every 2 hours - turn pt 30 degrees when turning right to left; use pillows to offload/reposition may include back lying in reposition schedule recline when at day program to offload ischial and trochanter ulcers Other: - hospital bed Additional Orders / Instructions: Wound #2 Right Trochanter: Vitamin A; Vitamin C, Zinc Increase protein intake. Other: - Please add Benecal to pts food. Wound #3 Left Trochanter: Vitamin A; Vitamin C, Zinc Increase protein intake. Other: - Please add Benecal to pts food. Wound #4 Left Toe Great: Vitamin A; Vitamin C, Zinc Increase protein intake. Other: - Please add Benecal to pts food. The following medication(s) was prescribed: lidocaine topical 4 % cream 1 1 cream topical was prescribed at facility Electronic Signature(s) Signed:  01/07/2018 9:11:58 PM By: Lawanda Cousins Previous Signature: 01/07/2018 11:26:43 AM Version By: Lawanda Cousins Entered By: Lawanda Cousins on 01/07/2018 21:11:57 [name] ([id number]) -------------------------------------------------------------------------------- ROS/PFSH Details Patient Name: [name]. Date of Service: 01/07/2018 9:00 AM Medical Record Number: [id number] Patient Account Number: [id number] Date of Birth/Sex: 1956/05/02 (62 y.o. M) Treating RN: Ahmed Prima Primary Care Provider: Ramonita Lab Other Clinician: Referring Provider: Ramonita Lab Treating Provider/Extender: Cathie Olden in Treatment: 3 Unable to Obtain Patient History due to oo Altered Mental Status Information Obtained From Patient Wound History Do you currently have one or more open woundso Yes How many open wounds do you currently haveo 2 Approximately how long have you had your woundso 3 weeks How have you been treating your  wound(s) until nowo gauze Has your wound(s) ever healed and then re-openedo No Have you had any lab work done in the past montho No Have you tested positive for an antibiotic resistant organism (MRSA, VRE)o No Have you tested positive for osteomyelitis (bone infection)o No Have you had any tests for circulation on your legso No Eyes Medical History: Negative for: Cataracts; Glaucoma; Optic Neuritis Past Medical History Notes: blind Ear/Nose/Mouth/Throat Medical History: Negative for: Chronic sinus problems/congestion; Middle ear problems Past Medical History Notes: deaf, unable to speak Hematologic/Lymphatic Medical History: Positive for: Anemia Negative for: Hemophilia; Human Immunodeficiency Virus; Lymphedema; Sickle Cell Disease Respiratory Medical History: Negative for: Aspiration; Asthma; Chronic Obstructive Pulmonary Disease (COPD); Pneumothorax; Sleep Apnea; Tuberculosis Cardiovascular Medical History: Positive for: Hypertension Negative for: Angina; Arrhythmia; Congestive Heart Failure; Coronary Artery Disease; Deep Vein Thrombosis; Hypotension; Myocardial Infarction; Peripheral Arterial Disease; Peripheral Venous Disease; Phlebitis; Vasculitis KHAIRI, GARMAN. ([id number]) Gastrointestinal Medical History: Negative for: Cirrhosis ; Colitis; Crohnos; Hepatitis A; Hepatitis B; Hepatitis C Past Medical History Notes: manutrition Endocrine Medical History: Negative for: Type I Diabetes; Type II Diabetes Genitourinary Medical History: Negative for: End Stage Renal Disease Past Medical History Notes: congenital phimosis Immunological Medical History: Negative for: Lupus Erythematosus; Raynaudos; Scleroderma Integumentary (Skin) Medical History: Negative for: History of Burn; History of pressure wounds Musculoskeletal Medical History: Negative for: Gout; Rheumatoid Arthritis; Osteoarthritis; Osteomyelitis Past Medical History  Notes: contracted Neurologic Medical History: Negative for: Dementia; Neuropathy; Quadriplegia; Paraplegia; Seizure Disorder Past Medical History Notes: congenital diplegia Oncologic Medical History: Negative for: Received Chemotherapy; Received Radiation Psychiatric Medical History: Past Medical History Notes: mood disorder Immunizations Pneumococcal Vaccine: Received Pneumococcal Vaccination: Yes Immunization Notes: up to date CASON, LUFFMAN ([id number]) Implantable Devices Family and Social History Never smoker; Marital Status - Single; Alcohol Use: Never; Drug Use: No History; Caffeine Use: Never; Financial Concerns: No; Food, Clothing or Shelter Needs: No; Support System Lacking: No; Transportation Concerns: No; Advanced Directives: No; Patient does not want information on Advanced Directives; Medical Power of Attorney: Yes - Ameer Sanden is guardian (Not Provided) Physician Affirmation I have reviewed and agree with the above information. Electronic Signature(s) Signed: 01/07/2018 9:10:59 PM By: Lawanda Cousins Signed: 01/08/2018 11:31:29 AM By: Alric Quan Entered By: Lawanda Cousins on 01/07/2018 11:26:03 [name] ([id number]) -------------------------------------------------------------------------------- SuperBill Details Patient Name: [name]. Date of Service: 01/07/2018 Medical Record Number: [id number] Patient Account Number: [id number] Date of Birth/Sex: 15-Jul-1955 (62 y.o. 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

## 2018-01-14 ENCOUNTER — Encounter: Payer: Medicare Other | Admitting: Nurse Practitioner

## 2018-01-14 DIAGNOSIS — L89210 Pressure ulcer of right hip, unstageable: Secondary | ICD-10-CM | POA: Diagnosis not present

## 2018-01-15 NOTE — Progress Notes (Addendum)
ANKUR, SNOWDON ([id number]) Visit Report for 01/14/2018 Arrival Information Details Patient Name: Shawn Sweeney, Shawn Sweeney. Date of Service: 01/14/2018 12:30 PM Medical Record Number: [id number] Patient Account Number: [id number] Date of Birth/Sex: 1956-06-02 (62 y.o. M) Treating RN: Roger Shelter Primary Care Wanda Cellucci: Ramonita Lab Other Clinician: Referring Clary Boulais: Ramonita Lab Treating Trenia Tennyson/Extender: Cathie Olden in Treatment: 4 Visit Information History Since Last Visit All ordered tests and consults were completed: No Patient Arrived: Wheel Chair Added or deleted any medications: No Arrival Time: 12:38 Any new allergies or adverse reactions: No Accompanied By: caregiver Had a fall or experienced change in No Transfer Assistance: None activities of daily living that may affect Patient Identification Verified: Yes risk of falls: Secondary Verification Process Completed: Yes Signs or symptoms of abuse/neglect since last visito No Hospitalized since last visit: No Implantable device outside of the clinic excluding No cellular tissue based products placed in the center since last visit: Pain Present Now: No Electronic Signature(s) Signed: 01/14/2018 3:39:18 PM By: Roger Shelter Entered By: Roger Shelter on 01/14/2018 12:38:58 Shawn Sweeney ([id number]) -------------------------------------------------------------------------------- Encounter Discharge Information Details Patient Name: Shawn Sweeney. Date of Service: 01/14/2018 12:30 PM Medical Record Number: [id number] Patient Account Number: [id number] Date of Birth/Sex: 1956-02-25 (62 y.o. M) Treating RN: Roger Shelter Primary Care Shirlena Brinegar: Ramonita Lab Other Clinician: Referring Nahdia Doucet: Ramonita Lab Treating Karlon Schlafer/Extender: Cathie Olden in Treatment: 4 Encounter Discharge Information Items Discharge Condition: Stable Ambulatory Status: Wheelchair Discharge Destination: Home Transportation:  Private Auto Accompanied By: caregiver Schedule Follow-up Appointment: No Clinical Summary of Care: Electronic Signature(s) Signed: 01/14/2018 3:39:18 PM By: Roger Shelter Entered By: Roger Shelter on 01/14/2018 13:25:46 Shawn Sweeney ([id number]) -------------------------------------------------------------------------------- Lower Extremity Assessment Details Patient Name: Shawn Sweeney. Date of Service: 01/14/2018 12:30 PM Medical Record Number: [id number] Patient Account Number: [id number] Date of Birth/Sex: Mar 28, 1956 (62 y.o. M) Treating RN: Roger Shelter Primary Care Herberto Ledwell: Ramonita Lab Other Clinician: Referring Judson Tsan: Ramonita Lab Treating Tommaso Cavitt/Extender: Cathie Olden in Treatment: 4 Vascular Assessment Claudication: Claudication Assessment [Left:None] Pulses: Dorsalis Pedis Palpable: [Left:Yes] Posterior Tibial Extremity colors, hair growth, and conditions: Extremity Color: [Left:Normal] Hair Growth on Extremity: [Left:Yes] Temperature of Extremity: [Left:Warm] Toe Nail Assessment Left: Right: Thick: No Discolored: No Deformed: No Improper Length and Hygiene: No Electronic Signature(s) Signed: 01/14/2018 3:39:18 PM By: Roger Shelter Entered By: Roger Shelter on 01/14/2018 12:54:46 Shawn Sweeney ([id number]) -------------------------------------------------------------------------------- Multi Wound Chart Details Patient Name: Shawn Sweeney. Date of Service: 01/14/2018 12:30 PM Medical Record Number: [id number] Patient Account Number: [id number] Date of Birth/Sex: 09-01-1955 (62 y.o. M) Treating RN: Ahmed Prima Primary Care Alfrieda Tarry: Ramonita Lab Other Clinician: Referring Marbeth Smedley: Ramonita Lab Treating Luca Burston/Extender: Cathie Olden in Treatment: 4 Vital Signs Height(in): Pulse(bpm): 97 Weight(lbs): Blood Pressure(mmHg): 119/97 Body Mass Index(BMI): Temperature(F): 98.4 Respiratory  Rate 16 (breaths/min): Photos: Wound Location: Right Trochanter Left Trochanter Left Toe Great Wounding Event: Pressure Injury Pressure Injury Gradually Appeared Primary Etiology: Pressure Ulcer Pressure Ulcer Atypical Comorbid History: Anemia, Hypertension Anemia, Hypertension Anemia, Hypertension Date Acquired: 09/29/2017 12/17/2017 12/17/2017 Weeks of Treatment: 4 4 4  Wound Status: Open Open Open Measurements L x W x D 1x2.2x0.1 2x3.2x0.2 1x0.3x0.1 (cm) Area (cm) : 1.728 5.027 0.236 Volume (cm) : 0.173 1.005 0.024 % Reduction in Area: -161.80% -540.40% 80.00% % Reduction in Volume: -162.10% -1172.20% 79.70% Classification: Category/Stage III Category/Stage III Full Thickness Without Exposed Support Structures Exudate Amount: Medium Large Medium Exudate Type: Serous Serosanguineous Serous Exudate Color: amber red, brown amber Wound Margin: Distinct, outline attached Distinct, outline attached  Flat and Intact Granulation Amount: None Present (0%) Small (1-33%) None Present (0%) Granulation Quality: N/A Pink N/A Necrotic Amount: Large (67-100%) Large (67-100%) Large (67-100%) Necrotic Tissue: Eschar, Adherent Slough Eschar, Adherent Slough Eschar Epithelialization: N/A N/A None Debridement: Debridement - Excisional N/A Debridement - Excisional Pre-procedure 13:01 N/A 13:01 Verification/Time Out Taken: Pain Control: Lidocaine 4% Topical Solution N/A Lidocaine 4% Topical Solution Tissue Debrided: Subcutaneous, Slough N/A Subcutaneous, Slough [address], [place]. ([id number]) Level: Skin/Subcutaneous Tissue N/A Skin/Subcutaneous Tissue Debridement Area (sq cm): 2.2 N/A 0.3 Instrument: Curette N/A Curette Bleeding: Minimum N/A Minimum Hemostasis Achieved: Pressure N/A Pressure Procedural Pain: 0 N/A 0 Post Procedural Pain: 0 N/A 0 Debridement Treatment Procedure was tolerated well N/A Procedure was tolerated well Response: Post Debridement 1x2.2x0.2 N/A 0.1x0.1x0.1 Measurements L x  W x D (cm) Post Debridement Volume: 0.346 N/A 0.001 (cm) Post Debridement Stage: Category/Stage III N/A N/A Periwound Skin Texture: Excoriation: No Excoriation: No Excoriation: No Induration: No Induration: No Induration: No Callus: No Callus: No Callus: No Crepitus: No Crepitus: No Crepitus: No Rash: No Rash: No Rash: No Scarring: No Scarring: No Scarring: No Periwound Skin Moisture: Maceration: No Maceration: No Maceration: No Dry/Scaly: No Dry/Scaly: No Dry/Scaly: No Periwound Skin Color: Atrophie Blanche: No Atrophie Blanche: No Atrophie Blanche: No Cyanosis: No Cyanosis: No Cyanosis: No Ecchymosis: No Ecchymosis: No Ecchymosis: No Erythema: No Erythema: No Erythema: No Hemosiderin Staining: No Hemosiderin Staining: No Hemosiderin Staining: No Mottled: No Mottled: No Mottled: No Pallor: No Pallor: No Pallor: No Rubor: No Rubor: No Rubor: No Temperature: No Abnormality No Abnormality No Abnormality Tenderness on Palpation: Yes No No Wound Preparation: Ulcer Cleansing: Ulcer Cleansing: Ulcer Cleansing: Rinsed/Irrigated with Saline Rinsed/Irrigated with Saline Rinsed/Irrigated with Saline Topical Anesthetic Applied: Topical Anesthetic Applied: Topical Anesthetic Applied: Other: lidocaine 4% Other: lidocaine 4% Other: lidocaine 4% Procedures Performed: Debridement N/A Debridement Treatment Notes Wound #2 (Right Trochanter) 1. Cleansed with: Clean wound with Normal Saline 2. Anesthetic Topical Lidocaine 4% cream to wound bed prior to debridement 4. Dressing Applied: Santyl Ointment 5. Secondary Dressing Applied Dry Gauze [place] Notes saline wet gauze with dry gauze cover with [place] #3 (Left Trochanter) Fogg, Breccan L. ([id number]) 1. Cleansed with: Clean wound with Normal Saline 2. Anesthetic Topical Lidocaine 4% cream to wound bed prior to debridement 4. Dressing Applied: Santyl Ointment 5. Secondary  Dressing Applied Dry Gauze [place] Notes saline wet gauze with dry gauze cover with [place] Wound #4 (Left Toe Great) 1. Cleansed with: Clean wound with Normal Saline 2. Anesthetic Topical Lidocaine 4% cream to wound bed prior to debridement Notes dry gauze with conform wrap and tape Electronic Signature(s) Signed: 01/14/2018 4:31:21 PM By: Lawanda Cousins Previous Signature: 01/14/2018 3:48:25 PM Version By: Alric Quan Entered By: Lawanda Cousins on 01/14/2018 [place], [place] ([id number]) -------------------------------------------------------------------------------- [place] Details Patient Name: GENEVIEVE, RITZEL. Date of Service: 01/14/2018 12:30 PM Medical Record Number: [id number] Patient Account Number: [id number] Date of Birth/Sex: 1955/07/28 (62 y.o. M) Treating RN: Ahmed Prima Primary Care Samaira Holzworth: Ramonita Lab Other Clinician: Referring Knox Cervi: Ramonita Lab Treating Caila Cirelli/Extender: Cathie Olden in Treatment: 4 Active Inactive ` Abuse / Safety / Falls / Self Care Management Nursing Diagnoses: Potential for falls Goals: Patient will remain injury free related to falls Date Initiated: 12/17/2017 Target Resolution Date: 04/03/2018 Goal Status: Active Interventions: Assess Activities of Daily Living upon admission and as needed Assess fall risk on admission and as needed Assess: immobility, friction, shearing, incontinence upon admission and as needed  Assess impairment of mobility on admission and as needed per policy Assess personal safety and home safety (as indicated) on admission and as needed Notes: ` Nutrition Nursing Diagnoses: Imbalanced nutrition Potential for alteratiion in Nutrition/Potential for imbalanced nutrition Goals: Patient/caregiver agrees to and verbalizes understanding of need to use nutritional supplements and/or vitamins as prescribed Date Initiated: 12/17/2017 Target Resolution Date:  04/03/2018 Goal Status: Active Interventions: Assess patient nutrition upon admission and as needed per policy Notes: ` Orientation to the Wound Care Program Nursing Diagnoses: Knowledge deficit related to the wound healing center program ILIA, ENGELBERT ([id number]) Goals: Patient/caregiver will verbalize understanding of the [place] Date Initiated: 12/17/2017 Target Resolution Date: 01/02/2018 Goal Status: Active Interventions: Provide education on orientation to the wound center Notes: ` Pain, Acute or Chronic Nursing Diagnoses: Pain, acute or chronic: actual or potential Potential alteration in comfort, pain Goals: Patient/caregiver will verbalize adequate pain control between visits Date Initiated: 12/17/2017 Target Resolution Date: 04/03/2018 Goal Status: Active Interventions: Complete pain assessment as per visit requirements Encourage patient to take pain medications as prescribed Implement pain control techniques (non-pharmaceutical) Provide education on pain management Notes: ` Wound/Skin Impairment Nursing Diagnoses: Impaired tissue integrity Knowledge deficit related to ulceration/compromised skin integrity Goals: Ulcer/skin breakdown will have a volume reduction of 80% by week 12 Date Initiated: 12/17/2017 Target Resolution Date: 03/27/2018 Goal Status: Active Interventions: Assess patient/caregiver ability to perform ulcer/skin care regimen upon admission and as needed Assess ulceration(s) every visit Notes: Electronic Signature(s) Signed: 01/14/2018 3:48:25 PM By: Alric Quan Entered By: Alric Quan on 01/14/2018 12:59:50 Shawn Sweeney ([id number] Gash, Aviva Signs ([id number]) -------------------------------------------------------------------------------- Pain Assessment Details Patient Name: Shawn Sweeney. Date of Service: 01/14/2018 12:30 PM Medical Record Number: [id number] Patient Account Number: [id number] Date of  Birth/Sex: 12-11-1955 (62 y.o. M) Treating RN: Roger Shelter Primary Care Ronold Hardgrove: Ramonita Lab Other Clinician: Referring Evangelos Paulino: Ramonita Lab Treating Tamara Monteith/Extender: Cathie Olden in Treatment: 4 Active Problems Location of Pain Severity and Description of Pain Patient Has Paino Patient Unable to Respond Site Locations Pain Management and Medication Current Pain Management: Electronic Signature(s) Signed: 01/14/2018 3:39:18 PM By: Roger Shelter Entered By: Roger Shelter on 01/14/2018 12:39:08 Shawn Sweeney ([id number]) -------------------------------------------------------------------------------- Patient/Caregiver Education Details Patient Name: Shawn Sweeney. Date of Service: 01/14/2018 12:30 PM Medical Record Number: [id number] Patient Account Number: [id number] Date of Birth/Gender: Jan 26, 1956 (62 y.o. M) Treating RN: Roger Shelter Primary Care Physician: Ramonita Lab Other Clinician: Referring Physician: Ramonita Lab Treating Physician/Extender: Cathie Olden in Treatment: 4 Education Assessment Education Provided To: Patient Education Topics Provided Wound Debridement: Handouts: Wound Debridement Methods: Explain/Verbal Responses: State content correctly Wound/Skin Impairment: Methods: Explain/Verbal Responses: State content correctly Electronic Signature(s) Signed: 01/14/2018 3:39:18 PM By: Roger Shelter Entered By: Roger Shelter on 01/14/2018 13:26:05 Shawn Sweeney ([id number]) -------------------------------------------------------------------------------- Wound Assessment Details Patient Name: Shawn Sweeney. Date of Service: 01/14/2018 12:30 PM Medical Record Number: [id number] Patient Account Number: [id number] Date of Birth/Sex: April 06, 1956 (62 y.o. M) Treating RN: Roger Shelter Primary Care Clayton Jarmon: Ramonita Lab Other Clinician: Referring Konner Warrior: Ramonita Lab Treating Zadin Lange/Extender: Lawanda Cousins Weeks in  Treatment: 4 Wound Status Wound Number: 2 Primary Etiology: Pressure Ulcer Wound Location: Right Trochanter Wound Status: Open Wounding Event: Pressure Injury Comorbid History: Anemia, Hypertension Date Acquired: 09/29/2017 Weeks Of Treatment: 4 Clustered Wound: No Photos Photo Uploaded By: Roger Shelter on 01/14/2018 15:47:23 Wound Measurements Length: (cm) 1 Width: (cm) 2.2 Depth: (cm) 0.1 Area: (cm) 1.728 Volume: (cm) 0.173 % Reduction in Area: -161.8% % Reduction  in Volume: -162.1% Tunneling: No Undermining: No Wound Description Classification: Category/Stage III Wound Margin: Distinct, outline attached Exudate Amount: Medium Exudate Type: Serous Exudate Color: amber Foul Odor After Cleansing: No Slough/Fibrino Yes Wound Bed Granulation Amount: None Present (0%) Necrotic Amount: Large (67-100%) Necrotic Quality: Eschar, Adherent Slough Periwound Skin Texture Texture Color No Abnormalities Noted: No No Abnormalities Noted: No Callus: No Atrophie Blanche: No Crepitus: No Cyanosis: No CHAPIN, ARDUINI ([id number]) Excoriation: No Ecchymosis: No Induration: No Erythema: No Rash: No Hemosiderin Staining: No Scarring: No Mottled: No Pallor: No Moisture Rubor: No No Abnormalities Noted: No Dry / Scaly: No Temperature / Pain Maceration: No Temperature: No Abnormality Tenderness on Palpation: Yes Wound Preparation Ulcer Cleansing: Rinsed/Irrigated with Saline Topical Anesthetic Applied: Other: lidocaine 4%, Treatment Notes Wound #2 (Right Trochanter) 1. Cleansed with: Clean wound with Normal Saline 2. Anesthetic Topical Lidocaine 4% cream to wound bed prior to debridement 4. Dressing Applied: Santyl Ointment 5. Secondary Dressing Applied Dry Gauze [place] Notes saline wet gauze with dry gauze cover with telfa Manufacturing systems engineer) Signed: 01/14/2018 3:39:18 PM By: Roger Shelter Entered By: Roger Shelter on 01/14/2018  12:51:39 Shawn Sweeney ([id number]) -------------------------------------------------------------------------------- Wound Assessment Details Patient Name: Dorie Rank L. Date of Service: 01/14/2018 12:30 PM Medical Record Number: [id number] Patient Account Number: [id number] Date of Birth/Sex: March 08, 1956 (62 y.o. M) Treating RN: Roger Shelter Primary Care Isiaah Cuervo: Ramonita Lab Other Clinician: Referring Brandt Chaney: Ramonita Lab Treating Shaneta Cervenka/Extender: Lawanda Cousins Weeks in Treatment: 4 Wound Status Wound Number: 3 Primary Etiology: Pressure Ulcer Wound Location: Left Trochanter Wound Status: Open Wounding Event: Pressure Injury Comorbid History: Anemia, Hypertension Date Acquired: 12/17/2017 Weeks Of Treatment: 4 Clustered Wound: No Photos Photo Uploaded By: Roger Shelter on 01/14/2018 15:47:24 Wound Measurements Length: (cm) 2 Width: (cm) 3.2 Depth: (cm) 0.2 Area: (cm) 5.027 Volume: (cm) 1.005 % Reduction in Area: -540.4% % Reduction in Volume: -1172.2% Tunneling: No Undermining: No Wound Description Classification: Category/Stage III Wound Margin: Distinct, outline attached Exudate Amount: Large Exudate Type: Serosanguineous Exudate Color: red, brown Foul Odor After Cleansing: No Slough/Fibrino Yes Wound Bed Granulation Amount: Small (1-33%) Exposed Structure Granulation Quality: Pink Fascia Exposed: No Necrotic Amount: Large (67-100%) Fat Layer (Subcutaneous Tissue) Exposed: Yes Necrotic Quality: Eschar, Adherent Slough Tendon Exposed: No Muscle Exposed: No Joint Exposed: No Bone Exposed: No Periwound Skin Texture Shell, Swan L. ([id number]) Texture Color No Abnormalities Noted: No No Abnormalities Noted: No Callus: No Atrophie Blanche: No Crepitus: No Cyanosis: No Excoriation: No Ecchymosis: No Induration: No Erythema: No Rash: No Hemosiderin Staining: No Scarring: No Mottled: No Pallor: No Moisture Rubor: No No Abnormalities  Noted: No Dry / Scaly: No Temperature / Pain Maceration: No Temperature: No Abnormality Wound Preparation Ulcer Cleansing: Rinsed/Irrigated with Saline Topical Anesthetic Applied: Other: lidocaine 4%, Treatment Notes Wound #3 (Left Trochanter) 1. Cleansed with: Clean wound with Normal Saline 2. Anesthetic Topical Lidocaine 4% cream to wound bed prior to debridement 4. Dressing Applied: Santyl Ointment 5. Secondary Dressing Applied Dry Gauze [place] Notes saline wet gauze with dry gauze cover with telfa Manufacturing systems engineer) Signed: 01/14/2018 3:39:18 PM By: Roger Shelter Entered By: Roger Shelter on 01/14/2018 12:52:46 Shawn Sweeney ([id number]) -------------------------------------------------------------------------------- Wound Assessment Details Patient Name: Dorie Rank L. Date of Service: 01/14/2018 12:30 PM Medical Record Number: [id number] Patient Account Number: [id number] Date of Birth/Sex: [birth year] (62 y.o. M) Treating RN: Roger Shelter Primary Care Camree Wigington: Ramonita Lab Other Clinician: Referring Adryan Druckenmiller: Ramonita Lab Treating Kyani Simkin/Extender: Lawanda Cousins Weeks in Treatment: 4 Wound Status Wound  Number: 4 Primary Etiology: Atypical Wound Location: Left Toe Great Wound Status: Open Wounding Event: Gradually Appeared Comorbid History: Anemia, Hypertension Date Acquired: 12/17/2017 Weeks Of Treatment: 4 Clustered Wound: No Photos Photo Uploaded By: Roger Shelter on 01/14/2018 15:47:47 Wound Measurements Length: (cm) 1 Width: (cm) 0.3 Depth: (cm) 0.1 Area: (cm) 0.236 Volume: (cm) 0.024 % Reduction in Area: 80% % Reduction in Volume: 79.7% Epithelialization: None Tunneling: No Undermining: No Wound Description Full Thickness Without Exposed Support Classification: Structures Wound Margin: Flat and Intact Exudate Medium Amount: Exudate Type: Serous Exudate Color: amber Foul Odor After Cleansing:  No Slough/Fibrino No Wound Bed Granulation Amount: None Present (0%) Exposed Structure Necrotic Amount: Large (67-100%) Fascia Exposed: No Necrotic Quality: Eschar Fat Layer (Subcutaneous Tissue) Exposed: No Tendon Exposed: No Muscle Exposed: No Joint Exposed: No Bone Exposed: No Luhman, Elder L. ([id number]) Periwound Skin Texture Texture Color No Abnormalities Noted: No No Abnormalities Noted: No Callus: No Atrophie Blanche: No Crepitus: No Cyanosis: No Excoriation: No Ecchymosis: No Induration: No Erythema: No Rash: No Hemosiderin Staining: No Scarring: No Mottled: No Pallor: No Moisture Rubor: No No Abnormalities Noted: No Dry / Scaly: No Temperature / Pain Maceration: No Temperature: No Abnormality Wound Preparation Ulcer Cleansing: Rinsed/Irrigated with Saline Topical Anesthetic Applied: Other: lidocaine 4%, Treatment Notes Wound #4 (Left Toe Great) 1. Cleansed with: Clean wound with Normal Saline 2. Anesthetic Topical Lidocaine 4% cream to wound bed prior to debridement Notes dry gauze with conform wrap and tape Electronic Signature(s) Signed: 01/14/2018 3:39:18 PM By: Roger Shelter Entered By: Roger Shelter on 01/14/2018 12:53:17 Shawn Sweeney ([id number]) -------------------------------------------------------------------------------- Vitals Details Patient Name: Shawn Sweeney. Date of Service: 01/14/2018 12:30 PM Medical Record Number: [id number] Patient Account Number: [id number] Date of Birth/Sex: [birth year] (62 y.o. M) Treating RN: Roger Shelter Primary Care Hisham Provence: Ramonita Lab Other Clinician: Referring Liyanna Cartwright: Ramonita Lab Treating Melisia Leming/Extender: Cathie Olden in Treatment: 4 Vital Signs Time Taken: 12:39 Temperature (F): 98.4 Pulse (bpm): 97 Respiratory Rate (breaths/min): 16 Blood Pressure (mmHg): 119/97 Reference Range: 80 - 120 mg / dl Electronic Signature(s) Signed: 01/14/2018 3:39:18 PM By: Roger Shelter Entered By: Roger Shelter on 01/14/2018 12:39:38

## 2018-01-18 ENCOUNTER — Encounter: Payer: Self-pay | Admitting: Emergency Medicine

## 2018-01-18 ENCOUNTER — Inpatient Hospital Stay
Admission: EM | Admit: 2018-01-18 | Discharge: 2018-01-20 | DRG: 640 | Disposition: A | Discharge status: Hospice | Payer: Medicare Other | Attending: Internal Medicine | Admitting: Internal Medicine

## 2018-01-18 ENCOUNTER — Other Ambulatory Visit: Payer: Self-pay

## 2018-01-18 ENCOUNTER — Emergency Department: Payer: Medicare Other

## 2018-01-18 DIAGNOSIS — Z515 Encounter for palliative care: Secondary | ICD-10-CM | POA: Diagnosis not present

## 2018-01-18 DIAGNOSIS — R0902 Hypoxemia: Secondary | ICD-10-CM | POA: Diagnosis present

## 2018-01-18 DIAGNOSIS — Z9049 Acquired absence of other specified parts of digestive tract: Secondary | ICD-10-CM | POA: Diagnosis not present

## 2018-01-18 DIAGNOSIS — Z8042 Family history of malignant neoplasm of prostate: Secondary | ICD-10-CM

## 2018-01-18 DIAGNOSIS — R1314 Dysphagia, pharyngoesophageal phase: Secondary | ICD-10-CM | POA: Diagnosis present

## 2018-01-18 DIAGNOSIS — L89320 Pressure ulcer of left buttock, unstageable: Secondary | ICD-10-CM | POA: Diagnosis present

## 2018-01-18 DIAGNOSIS — R972 Elevated prostate specific antigen [PSA]: Secondary | ICD-10-CM | POA: Diagnosis present

## 2018-01-18 DIAGNOSIS — M2450 Contracture, unspecified joint: Secondary | ICD-10-CM | POA: Diagnosis present

## 2018-01-18 DIAGNOSIS — R7989 Other specified abnormal findings of blood chemistry: Secondary | ICD-10-CM | POA: Diagnosis present

## 2018-01-18 DIAGNOSIS — I700 Atherosclerosis of aorta: Secondary | ICD-10-CM | POA: Diagnosis present

## 2018-01-18 DIAGNOSIS — H548 Legal blindness, as defined in USA: Secondary | ICD-10-CM | POA: Diagnosis present

## 2018-01-18 DIAGNOSIS — R4182 Altered mental status, unspecified: Secondary | ICD-10-CM

## 2018-01-18 DIAGNOSIS — L8990 Pressure ulcer of unspecified site, unspecified stage: Secondary | ICD-10-CM

## 2018-01-18 DIAGNOSIS — K4090 Unilateral inguinal hernia, without obstruction or gangrene, not specified as recurrent: Secondary | ICD-10-CM | POA: Diagnosis present

## 2018-01-18 DIAGNOSIS — E870 Hyperosmolality and hypernatremia: Secondary | ICD-10-CM | POA: Diagnosis present

## 2018-01-18 DIAGNOSIS — E860 Dehydration: Secondary | ICD-10-CM | POA: Diagnosis present

## 2018-01-18 DIAGNOSIS — Z800 Family history of malignant neoplasm of digestive organs: Secondary | ICD-10-CM

## 2018-01-18 DIAGNOSIS — L89310 Pressure ulcer of right buttock, unstageable: Secondary | ICD-10-CM | POA: Diagnosis present

## 2018-01-18 DIAGNOSIS — F89 Unspecified disorder of psychological development: Secondary | ICD-10-CM | POA: Diagnosis present

## 2018-01-18 DIAGNOSIS — Z79891 Long term (current) use of opiate analgesic: Secondary | ICD-10-CM

## 2018-01-18 DIAGNOSIS — N400 Enlarged prostate without lower urinary tract symptoms: Secondary | ICD-10-CM | POA: Diagnosis present

## 2018-01-18 DIAGNOSIS — Z66 Do not resuscitate: Secondary | ICD-10-CM | POA: Diagnosis present

## 2018-01-18 DIAGNOSIS — Z881 Allergy status to other antibiotic agents status: Secondary | ICD-10-CM

## 2018-01-18 DIAGNOSIS — K219 Gastro-esophageal reflux disease without esophagitis: Secondary | ICD-10-CM | POA: Diagnosis present

## 2018-01-18 DIAGNOSIS — Z7951 Long term (current) use of inhaled steroids: Secondary | ICD-10-CM

## 2018-01-18 DIAGNOSIS — E43 Unspecified severe protein-calorie malnutrition: Secondary | ICD-10-CM

## 2018-01-18 DIAGNOSIS — Z7401 Bed confinement status: Secondary | ICD-10-CM

## 2018-01-18 DIAGNOSIS — I10 Essential (primary) hypertension: Secondary | ICD-10-CM | POA: Diagnosis present

## 2018-01-18 DIAGNOSIS — Z85038 Personal history of other malignant neoplasm of large intestine: Secondary | ICD-10-CM | POA: Diagnosis not present

## 2018-01-18 DIAGNOSIS — Z888 Allergy status to other drugs, medicaments and biological substances status: Secondary | ICD-10-CM

## 2018-01-18 DIAGNOSIS — Z79899 Other long term (current) drug therapy: Secondary | ICD-10-CM

## 2018-01-18 DIAGNOSIS — Z681 Body mass index (BMI) 19 or less, adult: Secondary | ICD-10-CM | POA: Diagnosis not present

## 2018-01-18 DIAGNOSIS — G809 Cerebral palsy, unspecified: Secondary | ICD-10-CM | POA: Diagnosis present

## 2018-01-18 DIAGNOSIS — Z7189 Other specified counseling: Secondary | ICD-10-CM | POA: Diagnosis not present

## 2018-01-18 DIAGNOSIS — F39 Unspecified mood [affective] disorder: Secondary | ICD-10-CM | POA: Diagnosis present

## 2018-01-18 DIAGNOSIS — H903 Sensorineural hearing loss, bilateral: Secondary | ICD-10-CM | POA: Diagnosis present

## 2018-01-18 DIAGNOSIS — Z8249 Family history of ischemic heart disease and other diseases of the circulatory system: Secondary | ICD-10-CM

## 2018-01-18 LAB — BASIC METABOLIC PANEL
ANION GAP: 6 (ref 5–15)
ANION GAP: 3 — AB (ref 5–15)
BUN: 43 mg/dL — ABNORMAL HIGH (ref 8–23)
BUN: 38 mg/dL — ABNORMAL HIGH (ref 8–23)
CHLORIDE: 129 mmol/L — AB (ref 98–111)
CO2: 26 mmol/L (ref 22–32)
CO2: 28 mmol/L (ref 22–32)
Calcium: 8 mg/dL — ABNORMAL LOW (ref 8.9–10.3)
Calcium: 7.9 mg/dL — ABNORMAL LOW (ref 8.9–10.3)
Chloride: 128 mmol/L — ABNORMAL HIGH (ref 98–111)
Creatinine, Ser: 1.03 mg/dL (ref 0.61–1.24)
Creatinine, Ser: 0.88 mg/dL (ref 0.61–1.24)
GFR calc Af Amer: >60 mL/min (ref >60)
Glucose, Bld: 152 mg/dL — ABNORMAL HIGH (ref 70–99)
Glucose, Bld: 138 mg/dL — ABNORMAL HIGH (ref 70–99)
POTASSIUM: 3.8 mmol/L (ref 3.5–5.1)
POTASSIUM: 3.2 mmol/L — AB (ref 3.5–5.1)
SODIUM: 161 mmol/L — AB (ref 135–145)
SODIUM: 159 mmol/L — AB (ref 135–145)

## 2018-01-18 LAB — COMPREHENSIVE METABOLIC PANEL
ALT: 17 U/L (ref 0–44)
AST: 20 U/L (ref 15–41)
Albumin: 2.5 g/dL — ABNORMAL LOW (ref 3.5–5.0)
Alkaline Phosphatase: 77 U/L (ref 38–126)
Anion gap: 7 (ref 5–15)
BUN: 51 mg/dL — ABNORMAL HIGH (ref 8–23)
CO2: 29 mmol/L (ref 22–32)
Calcium: 8.6 mg/dL — ABNORMAL LOW (ref 8.9–10.3)
Chloride: 127 mmol/L — ABNORMAL HIGH (ref 98–111)
Creatinine, Ser: 1.14 mg/dL (ref 0.61–1.24)
GFR calc non Af Amer: >60 mL/min (ref >60)
Glucose, Bld: 116 mg/dL — ABNORMAL HIGH (ref 70–99)
Potassium: 4.5 mmol/L (ref 3.5–5.1)
Sodium: 163 mmol/L (ref 135–145)
Total Bilirubin: 0.4 mg/dL (ref 0.3–1.2)
Total Protein: 7.6 g/dL (ref 6.5–8.1)

## 2018-01-18 LAB — CBC WITH DIFFERENTIAL/PLATELET
BASOS ABS: 0 10*3/uL (ref 0–0.1)
Basophils Relative: 0 %
Eosinophils Absolute: 0.1 10*3/uL (ref 0–0.7)
Eosinophils Relative: 1 %
HEMATOCRIT: 47.3 % (ref 40.0–52.0)
Hemoglobin: 15.4 g/dL (ref 13.0–18.0)
Lymphocytes Relative: 7 %
Lymphs Abs: 0.8 10*3/uL — ABNORMAL LOW (ref 1.0–3.6)
MCH: 32 pg (ref 26.0–34.0)
MCHC: 32.4 g/dL (ref 32.0–36.0)
MCV: 98.7 fL (ref 80.0–100.0)
Monocytes Absolute: 0.7 10*3/uL (ref 0.2–1.0)
Monocytes Relative: 6 %
NEUTROS ABS: 9.9 10*3/uL — AB (ref 1.4–6.5)
Neutrophils Relative %: 86 %
Platelets: 319 10*3/uL (ref 150–440)
RBC: 4.8 MIL/uL (ref 4.40–5.90)
RDW: 13.2 % (ref 11.5–14.5)
WBC: 11.5 10*3/uL — ABNORMAL HIGH (ref 3.8–10.6)

## 2018-01-18 LAB — LACTIC ACID, PLASMA
LACTIC ACID, VENOUS: 2 mmol/L — AB (ref 0.5–1.9)
Lactic Acid, Venous: 2.4 mmol/L (ref 0.5–1.9)
Lactic Acid, Venous: 2.1 mmol/L (ref 0.5–1.9)

## 2018-01-18 MED ORDER — ONDANSETRON HCL 4 MG PO TABS: 4.0000 mg | ORAL_TABLET | Freq: Four times a day (QID) | ORAL | Status: DC | PRN

## 2018-01-18 MED ORDER — CLONAZEPAM 0.5 MG PO TABS
0.5000 mg | ORAL_TABLET | Freq: Two times a day (BID) | ORAL | Status: DC
  Administered 2018-01-18 – 2018-01-19 (×2): 0.5 mg via ORAL
  Filled 2018-01-18 (×3): qty 1

## 2018-01-18 MED ORDER — TRAMADOL HCL 50 MG PO TABS: 50.0000 mg | ORAL_TABLET | Freq: Three times a day (TID) | ORAL | Status: DC | PRN

## 2018-01-18 MED ORDER — DOCUSATE SODIUM 100 MG PO CAPS
100.0000 mg | ORAL_CAPSULE | Freq: Two times a day (BID) | ORAL | Status: DC
  Administered 2018-01-19: 22:00:00 100 mg via ORAL
  Filled 2018-01-18 (×2): qty 1

## 2018-01-18 MED ORDER — DEXTROSE 5 % IV SOLN
INTRAVENOUS | Status: DC
  Administered 2018-01-18 – 2018-01-19 (×3): via INTRAVENOUS

## 2018-01-18 MED ORDER — TAMSULOSIN HCL 0.4 MG PO CAPS
0.4000 mg | ORAL_CAPSULE | Freq: Every day | ORAL | Status: DC
  Administered 2018-01-18 – 2018-01-19 (×2): 0.4 mg via ORAL
  Filled 2018-01-18 (×2): qty 1

## 2018-01-18 MED ORDER — ENSURE ENLIVE PO LIQD
Freq: Four times a day (QID) | ORAL | Status: DC
  Administered 2018-01-18: 23:00:00 237 mL via ORAL
  Administered 2018-01-18 (×2): via ORAL

## 2018-01-18 MED ORDER — FLUOXETINE HCL 20 MG PO CAPS
20.0000 mg | ORAL_CAPSULE | ORAL | Status: DC
  Administered 2018-01-19: 06:00:00 20 mg via ORAL
  Filled 2018-01-18: qty 1

## 2018-01-18 MED ORDER — MEMANTINE HCL ER 28 MG PO CP24
28.0000 mg | ORAL_CAPSULE | Freq: Every day | ORAL | Status: DC
  Administered 2018-01-19: 11:00:00 28 mg via ORAL
  Filled 2018-01-18 (×2): qty 1

## 2018-01-18 MED ORDER — FINASTERIDE 5 MG PO TABS
5.0000 mg | ORAL_TABLET | Freq: Every day | ORAL | Status: DC
  Administered 2018-01-19: 11:00:00 5 mg via ORAL
  Filled 2018-01-18: qty 1

## 2018-01-18 MED ORDER — ACETAMINOPHEN 650 MG RE SUPP: 650.0000 mg | Freq: Four times a day (QID) | RECTAL | Status: DC | PRN

## 2018-01-18 MED ORDER — ARIPIPRAZOLE 2 MG PO TABS
3.0000 mg | ORAL_TABLET | Freq: Every day | ORAL | Status: DC
  Administered 2018-01-18 – 2018-01-19 (×2): 3 mg via ORAL
  Filled 2018-01-18 (×2): qty 2

## 2018-01-18 MED ORDER — ACETAMINOPHEN 325 MG PO TABS
650.0000 mg | ORAL_TABLET | Freq: Four times a day (QID) | ORAL | Status: DC | PRN
  Administered 2018-01-19: 650 mg via ORAL
  Filled 2018-01-18: qty 2

## 2018-01-18 MED ORDER — CLONAZEPAM 0.5 MG PO TABS: 0.5000 mg | ORAL_TABLET | Freq: Two times a day (BID) | ORAL | Status: DC

## 2018-01-18 MED ORDER — MAGNESIUM HYDROXIDE 400 MG/5ML PO SUSP
30.0000 mL | Freq: Every day | ORAL | Status: DC | PRN
  Filled 2018-01-18: qty 30

## 2018-01-18 MED ORDER — GUAIFENESIN 100 MG/5ML PO SOLN
100.0000 mg | ORAL | Status: DC | PRN
  Filled 2018-01-18: qty 5

## 2018-01-18 MED ORDER — PANTOPRAZOLE SODIUM 40 MG PO TBEC
40.0000 mg | DELAYED_RELEASE_TABLET | Freq: Every day | ORAL | Status: DC
  Administered 2018-01-19: 40 mg via ORAL
  Filled 2018-01-18: qty 1

## 2018-01-18 MED ORDER — DEXTROSE 5 % IV SOLN: Freq: Once | INTRAVENOUS | Status: DC

## 2018-01-18 MED ORDER — SODIUM CHLORIDE 0.9 % IV SOLN
1.0000 g | Freq: Once | INTRAVENOUS | Status: AC
  Administered 2018-01-18: 1 g via INTRAVENOUS
  Filled 2018-01-18: qty 10

## 2018-01-18 MED ORDER — POLYETHYLENE GLYCOL 3350 17 G PO PACK
17.0000 g | PACK | Freq: Every day | ORAL | Status: DC
  Filled 2018-01-18: qty 1

## 2018-01-18 MED ORDER — HEPARIN SODIUM (PORCINE) 5000 UNIT/ML IJ SOLN
5000.0000 [IU] | Freq: Three times a day (TID) | INTRAMUSCULAR | Status: DC
  Administered 2018-01-18 – 2018-01-19 (×4): 5000 [IU] via SUBCUTANEOUS
  Filled 2018-01-18 (×4): qty 1

## 2018-01-18 MED ORDER — ADULT MULTIVITAMIN W/MINERALS CH
1.0000 | ORAL_TABLET | Freq: Every day | ORAL | Status: DC
  Administered 2018-01-18 – 2018-01-19 (×2): 1 via ORAL
  Filled 2018-01-18 (×2): qty 1

## 2018-01-18 MED ORDER — SUCRALFATE 1 GM/10ML PO SUSP
1.0000 g | Freq: Two times a day (BID) | ORAL | Status: DC
  Administered 2018-01-18 – 2018-01-19 (×2): 1 g via ORAL
  Filled 2018-01-18 (×2): qty 10

## 2018-01-18 MED ORDER — SODIUM CHLORIDE 0.9 % IV BOLUS
1000.0000 mL | Freq: Once | INTRAVENOUS | Status: AC
  Administered 2018-01-18: 1000 mL via INTRAVENOUS

## 2018-01-18 MED ORDER — ONDANSETRON HCL 4 MG/2ML IJ SOLN: 4.0000 mg | Freq: Four times a day (QID) | INTRAMUSCULAR | Status: DC | PRN

## 2018-01-18 MED ORDER — SODIUM CHLORIDE 0.9 % IV SOLN
500.0000 mg | Freq: Once | INTRAVENOUS | Status: AC
  Administered 2018-01-18: 500 mg via INTRAVENOUS
  Filled 2018-01-18: qty 500

## 2018-01-18 NOTE — ED Notes (Signed)
10 blank lab labels printed for Jaci Carrel RN.

## 2018-01-18 NOTE — ED Notes (Signed)
Unable to get second line for second set of blood cultures despite attempt x4 total by 2 nurses. Will start antibiotics.

## 2018-01-18 NOTE — Progress Notes (Signed)
CODE SEPSIS - PHARMACY COMMUNICATION  **Broad Spectrum Antibiotics should be administered within 1 hour of Sepsis diagnosis**  Time Code Sepsis Called/Page Received: 10:09  Antibiotics Ordered: Ceftriaxone/azithromycin  Time of 1st antibiotic administration: 10:36  Additional action taken by pharmacy: N/A  If necessary, Name of Provider/Nurse Contacted: [place] ,PharmD Clinical Pharmacist  01/18/2018  1:11 PM

## 2018-01-18 NOTE — Progress Notes (Signed)
Pharmacy Antibiotic Note  Shawn Sweeney is a 62 y.o. male admitted on 01/18/2018 with sepsis.  Pharmacy has been consulted for sepsis dosing.  Plan: Patient received 1 dose of antibiotics in ED. Admitting MD wishes to follow LA instead of continuing empiric abx at this time per his note.   Weight: 64 lb 2 oz (29.1 kg)  Temp (24hrs), Avg:99.5 F (37.5 C), Min:99.5 F (37.5 C), Max:99.5 F (37.5 C)  Recent Labs  Lab 01/18/18 1007 01/18/18 1100  WBC 11.5*  --   CREATININE  --  1.14  LATICACIDVEN 2.4*  --     Estimated Creatinine Clearance: 27.3 mL/min (by C-G formula based on SCr of 1.14 mg/dL).    Allergies  Allergen Reactions  . Risperidone Hives and Other (See Comments)  . Levofloxacin Other (See Comments) and Rash    Antimicrobials this admission:   Dose adjustments this admission:   Microbiology results:  BCx:   UCx:    Sputum:    MRSA PCR:   Thank you for allowing pharmacy to be a part of this patient's care.  Laural Benes, Pharm.D., BCPS Clinical Pharmacist 01/18/2018 1:09 PM

## 2018-01-18 NOTE — ED Triage Notes (Signed)
From [facility], extremely weak, deaf and blind, nonverbal, no clear baseline.

## 2018-01-18 NOTE — Progress Notes (Signed)
Advanced care plan.  Purpose of the Encounter: CODE STATUS  Parties in Attendance: Patient and patient's brother who is a power of attorney  Patient's Decision Capacity: Poor  Subjective/Patient's story: Patient presented for lethargy and weakness    Objective/Medical story And has cerebral palsy, bilateral blindness, deafness and developmental disorder He is dehydrated and has high sodium level   Goals of care determination:  Advance care directives and goals of care discussed with patient with the help of patient brother who is a power of attorney.  Patient's family do not want any aggressive measures which includes CPR, intubation and ventilator if the need arises .  Patient is DNR   CODE STATUS: DNR   Time spent discussing advanced care planning: 16 minutes

## 2018-01-18 NOTE — ED Provider Notes (Signed)
[facility] Emergency Department Provider Note   ____________________________________________   First MD Initiated Contact with Patient 01/18/18 1008     (approximate)  I have reviewed the triage vital signs and the nursing notes.   HISTORY  Chief Complaint Weakness Patient's history limited by the fact that he does not give 1.  HP Shawn Sweeney is a 62 y.o. male comes from adult daycare because he was "rattling" patient had low sats when EMS got there.  Here patient is deaf and blind and unable to give any history.  He does not seem to react much.  He does have decubiti on both hips which have some drainage.  They do not appear to be infected they have eschars.  Patient weighs 68 pounds.  He is having rattling when he breathes and having a deep cough when he coughs he has a left-sided inguinal hernia.  He has multiple red spots on the shins bilaterally which may represent stage I decub's.   Past Medical History:  Diagnosis Date  . Abdominal wall hernia   . 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 Anemia   . Aortic atherosclerosis ([place]) 04/11/2016  . Blind   . BPH (benign prostatic hyperplasia)   . Calculus of gallbladder with acute cholecystitis and obstruction   . Cerebral palsy ([place])   . Congenital deafness   . Dermatophytosis of nail   . Elevated PSA   . GERD (gastroesophageal reflux disease) 01/16/2017  . Hypertension   . Lower extremity edema    echo with normal left and right heart fucntion, mild MR  . Mood disorder ([place])   . Nephrolithiasis   . Onychomycosis   . Phimosis   . Pneumonia 12/18/2014    Patient Active Problem List   Diagnosis Date Noted  . Mood disorder ([place]) 08/12/2017  . Anemia, unspecified 08/12/2017  . UTI (urinary tract infection) 01/16/2017  . HTN (hypertension) 01/16/2017  . BPH (benign prostatic hyperplasia) 01/16/2017  . GERD (gastroesophageal reflux disease) 01/16/2017  . Aortic atherosclerosis ([place]) 04/11/2016  .  Choking 02/13/2016  . Bilateral inguinal hernia without obstruction or gangrene 01/14/2016  . Abnormal loss of weight 12/06/2015  . Pharyngoesophageal dysphagia 08/21/2015  . History of colon cancer 08/21/2015  . Pneumonia 12/18/2014  . Hypotension 12/15/2014  . Bradycardia 12/15/2014  . Cerebral palsy ([place]) 12/15/2014  . 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 Blindness 12/15/2014  . Blindness of both eyes 12/15/2014  . Altered mental status 12/14/2014  . Hydronephrosis, left 12/14/2014  . Nephrolithiasis 12/14/2014    Class: Chronic  . Onychomycosis 09/22/2014  . Localized edema 09/22/2014  . 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 Abdominal wall hernia 07/06/2014  . Elevated prostate specific antigen (PSA) 06/15/2012    Past Surgical History:  Procedure Laterality Date  . CHOLECYSTECTOMY    . COLECTOMY    . COLON SURGERY    . COLONOSCOPY    . COLONOSCOPY WITH PROPOFOL N/A 10/08/2015   Procedure: COLONOSCOPY WITH PROPOFOL;  Surgeon: Hulen Luster, MD;  Location: [facility] ENDOSCOPY;  Service: Gastroenterology;  Laterality: N/A;  . DIAGNOSTIC LAPAROSCOPY    . ESOPHAGOGASTRODUODENOSCOPY    . ESOPHAGOGASTRODUODENOSCOPY (EGD) WITH PROPOFOL N/A 10/08/2015   Procedure: ESOPHAGOGASTRODUODENOSCOPY (EGD) WITH PROPOFOL;  Surgeon: Hulen Luster, MD;  Location: [facility] ENDOSCOPY;  Service: Gastroenterology;  Laterality: N/A;    Prior to Admission medications   Medication Sig Start Date End Date Taking? Authorizing Provider  acetaminophen (TYLENOL) 325 MG tablet Take 650 mg by  mouth every 4 (four) hours as needed for fever.   Yes [provider]  ARIPiprazole (ABILIFY) 2 MG tablet Take 3 mg by mouth daily. Take 1 and a 1/2 tablets by mouth daily.   Yes [provider]  clonazePAM (KLONOPIN) 0.5 MG tablet Take 0.5 mg by mouth 2 (two) times daily.  06/15/12  Yes [provider]  docusate sodium (COLACE) 100 MG capsule Take 100 mg by mouth 2 (two) times daily.   Yes [provider]  fexofenadine (ALLEGRA) 180 MG tablet Take 180 mg by mouth daily as needed for allergies or rhinitis.   Yes [provider]  finasteride (PROSCAR) 5 MG tablet Take 1 tablet (5 mg total) by mouth daily. 08/19/17  Yes Stoioff, Ronda Fairly, MD  FLUoxetine (PROZAC) 20 MG capsule Take 20 mg by mouth every morning.    Yes [provider]  furosemide (LASIX) 20 MG tablet Take 20 mg by mouth daily as needed for edema.    Yes [provider]  guaiFENesin (ROBITUSSIN) 100 MG/5ML liquid Take 100 mg by mouth every 4 (four) hours as needed for cough.    Yes [provider]  hydrocortisone (ANUSOL-HC) 25 MG suppository Place 25 mg rectally every 12 (twelve) hours as needed (rectal bleeding).    Yes [provider]  loperamide (IMODIUM A-D) 2 MG tablet Take 2 mg by mouth 4 (four) times daily as needed for diarrhea or loose stools.   Yes [provider]  magnesium hydroxide (MILK OF MAGNESIA) 400 MG/5ML suspension Take 30 mLs by mouth daily as needed for mild constipation.   Yes [provider]  memantine (NAMENDA XR) 28 MG CP24 24 hr capsule Take 28 mg by mouth daily.   Yes [provider]  Multiple Vitamin (MULTI-VITAMINS) TABS Take 1 tablet by mouth daily.   Yes [provider]  Nutritional Supplements (ENSURE HIGH PROTEIN PO) Take 1 oz by mouth 4 (four) times daily.   Yes [provider]  omeprazole (PRILOSEC) 20 MG capsule Take 20 mg by mouth 2 (two) times daily.   Yes [provider]  polyethylene glycol (MIRALAX / GLYCOLAX) packet Take 17 g by mouth daily.   Yes [provider]  potassium chloride (K-DUR) 10 MEQ tablet Take 10 mEq by mouth daily as needed (edema). Take with lasix   Yes [provider]  sucralfate (CARAFATE) 1 GM/10ML suspension Take 1 g by mouth 2 (two) times daily before a meal.   Yes [provider]  tamsulosin (FLOMAX) 0.4 MG CAPS capsule Take 1 capsule by  mouth daily. 06/15/12  Yes [provider]  traMADol (ULTRAM) 50 MG tablet Take 50 mg by mouth 3 (three) times daily as needed for moderate pain.    Yes [provider]  fluticasone (FLONASE) 50 MCG/ACT nasal spray Place 2 sprays into both nostrils daily. Patient not taking: Reported on 01/18/2018 08/13/17 08/13/18  Orbie Pyo, MD    Allergies Risperidone and Levofloxacin  Family History  Problem Relation Age of Onset  . CAD Father   . Prostate cancer Father   . Colon cancer Father     Social History Social History   Tobacco Use  . Smoking status: Never Smoker  . Smokeless tobacco: Never Used  Substance Use Topics  . Alcohol use: No  . Drug use: No    Review of Systems Unable to obtain  ____________________________________________   PHYSICAL EXAM:  VITAL SIGNS: ED Triage Vitals  Enc Vitals  Group     BP --      Pulse Rate 01/18/18 1000 (!) 108     Resp 01/18/18 1000 20     Temp 01/18/18 1000 99.5 F (37.5 C)     Temp Source 01/18/18 1000 Oral     SpO2 01/18/18 1000 (!) 89 %     Weight 01/18/18 1001 64 lb 2 oz (29.1 kg)     Height --      Head Circumference --      Peak Flow --      Pain Score --      Pain Loc --      Pain Edu? --      Excl. in [place]? --     Constitutional: Awake well moving his right hand not really moving anything else.  He is contracted and cannot lay flat his knees are flexed as well as far as his hips Eyes: Patient has a disconjugate gaze that appears to be old corneas are slightly cloudy Head: Atraumatic. Nose: No congestion/rhinnorhea. Mouth/Throat: Mucous membranes are moist.  Oropharynx non-erythematous. Neck: No stridor.   Cardiovascular: Normal rate, regular rhythm. Grossly normal heart sounds.  Good peripheral circulation. Respiratory: Normal respiratory effort.  No retractions. Lungs rattling respirations Gastrointestinal: Soft and nontender. No distention. No abdominal bruits. No CVA  tenderness. Musculoskeletal: No lower extremity tenderness nor edema.  See description in HPI Neurologic: Not speaking moving only his right arm contracted. Skin:  Skin is warm, dry and intact.  See HPI   ____________________________________________   LABS (all labs ordered are listed, but only abnormal results are displayed)  Labs Reviewed  CBC WITH DIFFERENTIAL/PLATELET - Abnormal; Notable for the following components:      Result Value   WBC 11.5 (*)    Neutro Abs 9.9 (*)    Lymphs Abs 0.8 (*)    All other components within normal limits  LACTIC ACID, PLASMA - Abnormal; Notable for the following components:   Lactic Acid, Venous 2.4 (*)    All other components within normal limits  CULTURE, BLOOD (ROUTINE X 2)  CULTURE, BLOOD (ROUTINE X 2)  URINALYSIS, ROUTINE W REFLEX MICROSCOPIC  LACTIC ACID, PLASMA  COMPREHENSIVE METABOLIC PANEL   ____________________________________________  EKG  EKG shows sinus tachycardia rate of 109 right axis no acute ST-T changes large P waves.  Is reading left posterior hemiblock ____________________________________________  RADIOLOGY  ED MD interpretation:   Official radiology report(s): Dg Chest Portable 1 View  Result Date: 01/18/2018 CLINICAL DATA:  Low O2 saturation.  Weakness. EXAM: PORTABLE CHEST 1 VIEW COMPARISON:  08/13/2017 FINDINGS: The heart size and pulmonary vascularity are normal and the lungs are clear. No effusions. Chronic thoracolumbar scoliosis. No acute bone abnormality. IMPRESSION: No acute disease in the chest. Electronically Signed   By: Lorriane Shire M.D.   On: 01/18/2018 10:29    ____________________________________________   PROCEDURES  Procedure(s) performed:   Procedures  Critical Care performed:  ____________________________________________   INITIAL IMPRESSION / ASSESSMENT AND PLAN / ED COURSE        ____________________________________________   FINAL CLINICAL IMPRESSION(S) / ED  DIAGNOSES  Final diagnoses:  Altered mental status, unspecified altered mental status type  Hypoxia     ED Discharge Orders    None       Note:  This document was prepared using Dragon voice recognition software and may include unintentional dictation errors.    Nena Polio, MD 01/18/18 [phone]

## 2018-01-18 NOTE — H&P (Signed)
[place] [facility] [place] NAME: Shawn Sweeney    MR#:  [id number]  DATE OF BIRTH:  Jul 24, 1955  DATE OF ADMISSION:  01/18/2018  PRIMARY CARE PHYSICIAN: Adin Hector, MD   REQUESTING/REFERRING PHYSICIAN:   CHIEF COMPLAINT:   Chief Complaint  Patient presents with  . Weakness    HISTORY OF PRESENT ILLNESS: Shawn Sweeney  is a 62 y.o. male with a known history of cerebral palsy congenital deafness, blindness, adenocarcinoma of the cecum, hypertension, nephrolithiasis was referred from group home for generalized weakness.  Patient is deaf and blind unable to give any history.  Patient's brother power of attorney was at bedside who said patient has become weak and has poor oral intake.  Patient is mostly bedbound and does not ambulate.  He was evaluated in the emergency room his sodium was level high and appeared dehydrated.  Patient lactic acid also was high and he was given 1 dose of IV antibiotics in the emergency room.  Hospitalist service was consulted for further care.  PAST MEDICAL HISTORY:   Past Medical History:  Diagnosis Date  . Abdominal wall hernia   . 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 Anemia   . Aortic atherosclerosis ([place]) 04/11/2016  . Blind   . BPH (benign prostatic hyperplasia)   . Calculus of gallbladder with acute cholecystitis and obstruction   . Cerebral palsy ([place])   . Congenital deafness   . Dermatophytosis of nail   . Elevated PSA   . GERD (gastroesophageal reflux disease) 01/16/2017  . Hypertension   . Lower extremity edema    echo with normal left and right heart fucntion, mild MR  . Mood disorder ([place])   . Nephrolithiasis   . Onychomycosis   . Phimosis   . Pneumonia 12/18/2014    PAST SURGICAL HISTORY:  Past Surgical History:  Procedure Laterality Date  . CHOLECYSTECTOMY    . COLECTOMY    . COLON SURGERY    . COLONOSCOPY    . COLONOSCOPY WITH PROPOFOL N/A 10/08/2015   Procedure: COLONOSCOPY WITH  PROPOFOL;  Surgeon: Hulen Luster, MD;  [facility] ENDOSCOPY;  Service: Gastroenterology;  Laterality: N/A;  . DIAGNOSTIC LAPAROSCOPY    . ESOPHAGOGASTRODUODENOSCOPY    . ESOPHAGOGASTRODUODENOSCOPY (EGD) WITH PROPOFOL N/A 10/08/2015   Procedure: ESOPHAGOGASTRODUODENOSCOPY (EGD) WITH PROPOFOL;  Surgeon: Hulen Luster, MD;  Location: [facility] ENDOSCOPY;  Service: Gastroenterology;  Laterality: N/A;    SOCIAL HISTORY:  Social History   Tobacco Use  . Smoking status: Never Smoker  . Smokeless tobacco: Never Used  Substance Use Topics  . Alcohol use: No    FAMILY HISTORY:  Family History  Problem Relation Age of Onset  . CAD Father   . Prostate cancer Father   . Colon cancer Father     DRUG ALLERGIES:  Allergies  Allergen Reactions  . Risperidone Hives and Other (See Comments)  . Levofloxacin Other (See Comments) and Rash    REVIEW OF SYSTEMS:  Could not be obtained as patient is deaf and blind MEDICATIONS AT HOME:  Prior to Admission medications   Medication Sig Start Date End Date Taking? Authorizing Provider  acetaminophen (TYLENOL) 325 MG tablet Take 650 mg by mouth every 4 (four) hours as needed for fever.   Yes [provider]  ARIPiprazole (ABILIFY) 2 MG tablet Take 3 mg by mouth daily. Take 1 and a 1/2 tablets by mouth daily.   Yes [provider]  clonazePAM (KLONOPIN) 0.5 MG tablet Take 0.5 mg by mouth 2 (two) times daily.  06/15/12  Yes [provider]  docusate sodium (COLACE) 100 MG capsule Take 100 mg by mouth 2 (two) times daily.   Yes [provider]  fexofenadine (ALLEGRA) 180 MG tablet Take 180 mg by mouth daily as needed for allergies or rhinitis.   Yes [provider]  finasteride (PROSCAR) 5 MG tablet Take 1 tablet (5 mg total) by mouth daily. 08/19/17  Yes Stoioff, Ronda Fairly, MD  FLUoxetine (PROZAC) 20 MG capsule Take 20 mg by mouth every morning.    Yes [provider]  furosemide (LASIX) 20 MG tablet Take 20 mg  by mouth daily as needed for edema.    Yes [provider]  guaiFENesin (ROBITUSSIN) 100 MG/5ML liquid Take 100 mg by mouth every 4 (four) hours as needed for cough.    Yes [provider]  hydrocortisone (ANUSOL-HC) 25 MG suppository Place 25 mg rectally every 12 (twelve) hours as needed (rectal bleeding).    Yes [provider]  loperamide (IMODIUM A-D) 2 MG tablet Take 2 mg by mouth 4 (four) times daily as needed for diarrhea or loose stools.   Yes [provider]  magnesium hydroxide (MILK OF MAGNESIA) 400 MG/5ML suspension Take 30 mLs by mouth daily as needed for mild constipation.   Yes [provider]  memantine (NAMENDA XR) 28 MG CP24 24 hr capsule Take 28 mg by mouth daily.   Yes [provider]  Multiple Vitamin (MULTI-VITAMINS) TABS Take 1 tablet by mouth daily.   Yes [provider]  Nutritional Supplements (ENSURE HIGH PROTEIN PO) Take 1 oz by mouth 4 (four) times daily.   Yes [provider]  omeprazole (PRILOSEC) 20 MG capsule Take 20 mg by mouth 2 (two) times daily.   Yes [provider]  polyethylene glycol (MIRALAX / GLYCOLAX) packet Take 17 g by mouth daily.   Yes [provider]  potassium chloride (K-DUR) 10 MEQ tablet Take 10 mEq by mouth daily as needed (edema). Take with lasix   Yes [provider]  sucralfate (CARAFATE) 1 GM/10ML suspension Take 1 g by mouth 2 (two) times daily before a meal.   Yes [provider]  tamsulosin (FLOMAX) 0.4 MG CAPS capsule Take 1 capsule by mouth daily. 06/15/12  Yes [provider]  traMADol (ULTRAM) 50 MG tablet Take 50 mg by mouth 3 (three) times daily as needed for moderate pain.    Yes [provider]  fluticasone (FLONASE) 50 MCG/ACT nasal spray Place 2 sprays into both nostrils daily. Patient not taking: Reported on 01/18/2018 08/13/17 08/13/18  Orbie Pyo, MD      PHYSICAL EXAMINATION:   VITAL  SIGNS: Blood pressure 116/69, pulse 81, temperature 99.5 F (37.5 C), temperature source Oral, resp. rate 20, weight 29.1 kg (64 lb 2 oz), SpO2 100 %.  GENERAL:  62 y.o.-year-old patient lying in the bed  EYES: Pupils equal, round, reactive to light and accommodation. No scleral icterus. Extraocular muscles intact.  HEENT: Head atraumatic, normocephalic. Oropharynx dry and nasopharynx clear.  NECK:  Supple, no jugular venous distention. No thyroid enlargement, no tenderness.  LUNGS: Normal breath sounds bilaterally, no wheezing, rales,rhonchi or crepitation. No use of accessory muscles of respiration.  CARDIOVASCULAR: S1, S2 normal. No murmurs, rubs, or gallops.  ABDOMEN: Soft, nontender, nondistended. Bowel sounds present. No organomegaly or mass.  EXTREMITIES: No pedal edema, cyanosis, or clubbing.  NEUROLOGIC: Awake  sitting in the bed Not oriented to time place and person Developmentally retarded PSYCHIATRIC: Could not be assessed SKIN: No obvious rash, lesion, or ulcer.   LABORATORY PANEL:   CBC Recent Labs  Lab 01/18/18 1007  WBC 11.5*  HGB 15.4  HCT 47.3  PLT 319  MCV 98.7  MCH 32.0  MCHC 32.4  RDW 13.2  LYMPHSABS 0.8*  MONOABS 0.7  EOSABS 0.1  BASOSABS 0.0   ------------------------------------------------------------------------------------------------------------------  Chemistries  Recent Labs  Lab 01/18/18 1100  NA 163*  K 4.5  CL 127*  CO2 29  GLUCOSE 116*  BUN 51*  CREATININE 1.14  CALCIUM 8.6*  AST 20  ALT 17  ALKPHOS 77  BILITOT 0.4   ------------------------------------------------------------------------------------------------------------------ estimated creatinine clearance is 27.3 mL/min (by C-G formula based on SCr of 1.14 mg/dL). ------------------------------------------------------------------------------------------------------------------ No results for input(s): TSH, T4TOTAL, T3FREE, THYROIDAB in the last 72 hours.  Invalid  input(s): FREET3   Coagulation profile No results for input(s): INR, PROTIME in the last 168 hours. ------------------------------------------------------------------------------------------------------------------- No results for input(s): DDIMER in the last 72 hours. -------------------------------------------------------------------------------------------------------------------  Cardiac Enzymes No results for input(s): CKMB, TROPONINI, MYOGLOBIN in the last 168 hours.  Invalid input(s): CK ------------------------------------------------------------------------------------------------------------------ Invalid input(s): POCBNP  ---------------------------------------------------------------------------------------------------------------  Urinalysis    Component Value Date/Time   COLORURINE YELLOW (A) 02/05/2017 1429   APPEARANCEUR HAZY (A) 02/05/2017 1429   APPEARANCEUR Turbid 10/28/2011 1952   LABSPEC 1.020 02/05/2017 1429   LABSPEC 1.006 10/28/2011 1952   PHURINE 5.0 02/05/2017 1429   GLUCOSEU NEGATIVE 02/05/2017 1429   GLUCOSEU Negative 10/28/2011 1952   HGBUR NEGATIVE 02/05/2017 1429   BILIRUBINUR NEGATIVE 02/05/2017 1429   BILIRUBINUR Negative 10/28/2011 1952   KETONESUR NEGATIVE 02/05/2017 1429   PROTEINUR NEGATIVE 02/05/2017 1429   NITRITE NEGATIVE 02/05/2017 1429   LEUKOCYTESUR NEGATIVE 02/05/2017 1429   LEUKOCYTESUR Negative 10/28/2011 1952     RADIOLOGY: Dg Chest Portable 1 View  Result Date: 01/18/2018 CLINICAL DATA:  Low O2 saturation.  Weakness. EXAM: PORTABLE CHEST 1 VIEW COMPARISON:  08/13/2017 FINDINGS: The heart size and pulmonary vascularity are normal and the lungs are clear. No effusions. Chronic thoracolumbar scoliosis. No acute bone abnormality. IMPRESSION: No acute disease in the chest. Electronically Signed   By: Lorriane Shire M.D.   On: 01/18/2018 10:29    EKG: Orders placed or performed during the hospital encounter of 01/18/18  . EKG  12-Lead  . EKG 12-Lead  . ED EKG 12-Lead  . ED EKG 12-Lead    IMPRESSION AND PLAN:  62 year old male patient with history of cerebral palsy, developmental retardation, bilateral deafness, blindness, hypertension, adenocarcinoma of cecum presented to the emergency room for lethargy and weakness  -Acute hypernatremia  IV hydration with D5W Follow-up sodium levels closely  -Dehydration IV fluids  -Elevated lactic acid most probably secondary to dehydration Patient received 1 dose of IV antibiotics in the emergency room will follow up the lactic acid level  -Cerebral palsy with developmental retardation and bilateral deafness and blindness Supportive care  -DVT prophylaxis subcu heparin   All the records are reviewed and case discussed with ED provider. Management plans discussed with the patient, family and they are in agreement.  CODE STATUS: DNR    Code Status Orders  (From admission, onward)        Start     Ordered   01/18/18 1230  Do not attempt resuscitation (DNR)  Continuous    Question Answer Comment  In the event of cardiac or respiratory ARREST Do not call  a "code blue"   In the event of cardiac or respiratory ARREST Do not perform Intubation, CPR, defibrillation or ACLS   In the event of cardiac or respiratory ARREST Use medication by any route, position, wound care, and other measures to relive pain and suffering. May use oxygen, suction and manual treatment of airway obstruction as needed for comfort.      01/18/18 1229    Code Status History    Date Active Date Inactive Code Status Order ID Comments User Context   01/16/2017 2109 01/18/2017 1734 Partial Code [id number]  Lance Coon, MD Inpatient   12/15/2014 1454 12/18/2014 2117 Partial Code [id number]  Arne Cleveland, MD Inpatient   12/14/2014 1425 12/15/2014 1454 DNR [id number]  Demetrios Loll, MD Inpatient       TOTAL TIME TAKING CARE OF THIS PATIENT: 55 minutes.    Saundra Shelling M.D on 01/18/2018 at 12:29  PM  Between 7am to 6pm - Pager - [phone] Ascom [phone]  After 6pm go to [email or web address] - password [email or web address] [facility]  [place] [facility]  Office  [phone]  CC: Primary care physician; Adin Hector, MD

## 2018-01-19 DIAGNOSIS — E870 Hyperosmolality and hypernatremia: Principal | ICD-10-CM

## 2018-01-19 DIAGNOSIS — R4182 Altered mental status, unspecified: Secondary | ICD-10-CM

## 2018-01-19 DIAGNOSIS — L8990 Pressure ulcer of unspecified site, unspecified stage: Secondary | ICD-10-CM

## 2018-01-19 DIAGNOSIS — Z7189 Other specified counseling: Secondary | ICD-10-CM

## 2018-01-19 DIAGNOSIS — Z515 Encounter for palliative care: Secondary | ICD-10-CM

## 2018-01-19 DIAGNOSIS — E43 Unspecified severe protein-calorie malnutrition: Secondary | ICD-10-CM

## 2018-01-19 LAB — CBC
HCT: 37.8 % — ABNORMAL LOW (ref 40.0–52.0)
Hemoglobin: 12.3 g/dL — ABNORMAL LOW (ref 13.0–18.0)
MCH: 32.5 pg (ref 26.0–34.0)
MCHC: 32.6 g/dL (ref 32.0–36.0)
MCV: 99.6 fL (ref 80.0–100.0)
Platelets: 214 10*3/uL (ref 150–440)
RBC: 3.79 MIL/uL — ABNORMAL LOW (ref 4.40–5.90)
RDW: 13.2 % (ref 11.5–14.5)
WBC: 11.2 10*3/uL — ABNORMAL HIGH (ref 3.8–10.6)

## 2018-01-19 LAB — BASIC METABOLIC PANEL
Anion gap: 5 (ref 5–15)
Anion gap: 7 (ref 5–15)
BUN: 30 mg/dL — AB (ref 8–23)
BUN: 27 mg/dL — AB (ref 8–23)
CALCIUM: 8.2 mg/dL — AB (ref 8.9–10.3)
CHLORIDE: 120 mmol/L — AB (ref 98–111)
CO2: 26 mmol/L (ref 22–32)
CO2: 26 mmol/L (ref 22–32)
CREATININE: 0.76 mg/dL (ref 0.61–1.24)
Calcium: 8 mg/dL — ABNORMAL LOW (ref 8.9–10.3)
Chloride: 124 mmol/L — ABNORMAL HIGH (ref 98–111)
Creatinine, Ser: 0.81 mg/dL (ref 0.61–1.24)
GFR calc Af Amer: >60 mL/min (ref >60)
GFR calc Af Amer: >60 mL/min (ref >60)
GFR calc non Af Amer: >60 mL/min (ref >60)
GLUCOSE: 109 mg/dL — AB (ref 70–99)
GLUCOSE: 93 mg/dL (ref 70–99)
POTASSIUM: 3.6 mmol/L (ref 3.5–5.1)
POTASSIUM: 3.4 mmol/L — AB (ref 3.5–5.1)
SODIUM: 155 mmol/L — AB (ref 135–145)
SODIUM: 153 mmol/L — AB (ref 135–145)

## 2018-01-19 MED ORDER — COLLAGENASE 250 UNIT/GM EX OINT
TOPICAL_OINTMENT | Freq: Every day | CUTANEOUS | Status: DC
  Administered 2018-01-19: 22:00:00 via TOPICAL
  Filled 2018-01-19 (×2): qty 30

## 2018-01-19 MED ORDER — VITAMIN C 500 MG PO TABS
250.0000 mg | ORAL_TABLET | Freq: Two times a day (BID) | ORAL | Status: DC
  Administered 2018-01-19: 250 mg via ORAL
  Filled 2018-01-19 (×2): qty 0.5

## 2018-01-19 MED ORDER — OCUVITE-LUTEIN PO CAPS
1.0000 | ORAL_CAPSULE | Freq: Every day | ORAL | Status: DC
  Filled 2018-01-19: qty 1

## 2018-01-19 MED ORDER — MORPHINE SULFATE (CONCENTRATE) 10 MG/0.5ML PO SOLN: 3.0000 mg | ORAL | Status: DC | PRN

## 2018-01-19 NOTE — NC FL2 (Signed)
[place] LEVEL OF CARE SCREENING TOOL     IDENTIFICATION  Patient Name: Shawn Sweeney Birthdate: [birth year] Sex: male Admission Date (Current Location): 01/18/2018  [place] and [place] Number:  Engineering geologist and Address:  [facility], [address], [place], [place] [address]      Provider Number: [id number]  Attending Physician Name and Address:  Vaughan Basta, *  Relative Name and Phone Number:  Guardian Buell Parcel- brother [phone]    Current Level of Care: Hospital Recommended Level of Care: Other (Comment)(Group Home ) Prior Approval Number:    Date Approved/Denied:   PASRR Number:    Discharge Plan: Other (Comment)(Group Home )    Current Diagnoses: Patient Active Problem List   Diagnosis Date Noted  . Hypernatremia 01/18/2018  . Mood disorder ([place]) 08/12/2017  . Anemia, unspecified 08/12/2017  . UTI (urinary tract infection) 01/16/2017  . HTN (hypertension) 01/16/2017  . BPH (benign prostatic hyperplasia) 01/16/2017  . GERD (gastroesophageal reflux disease) 01/16/2017  . Aortic atherosclerosis ([place]) 04/11/2016  . Choking 02/13/2016  . Bilateral inguinal hernia without obstruction or gangrene 01/14/2016  . Abnormal loss of weight 12/06/2015  . Pharyngoesophageal dysphagia 08/21/2015  . History of colon cancer 08/21/2015  . Pneumonia 12/18/2014  . Hypotension 12/15/2014  . Bradycardia 12/15/2014  . Cerebral palsy ([place]) 12/15/2014  . 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 Blindness 12/15/2014  . Blindness of both eyes 12/15/2014  . Altered mental status 12/14/2014  . Hydronephrosis, left 12/14/2014  . Nephrolithiasis 12/14/2014    Class: Chronic  . Onychomycosis 09/22/2014  . Localized edema 09/22/2014  . 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 Abdominal wall hernia 07/06/2014  . Elevated prostate specific antigen (PSA) 06/15/2012    Orientation RESPIRATION BLADDER Height & Weight         O2(2 liters ) Incontinent Weight: 64 lb (29 kg) Height:  5' (152.4 cm)  BEHAVIORAL SYMPTOMS/MOOD NEUROLOGICAL BOWEL NUTRITION STATUS  (None ) (None) Incontinent Diet(Dysphagia 1 )  AMBULATORY STATUS COMMUNICATION OF NEEDS Skin     Non-Verbally PU Stage and Appropriate Care                       Personal Care Assistance Level of Assistance  Bathing, Feeding, Dressing Bathing Assistance: Limited assistance Feeding assistance: Independent Dressing Assistance: Limited assistance Total Care Assistance: Limited assistance   Functional Limitations Info  Sight, Hearing, Speech Sight Info: Impaired Hearing Info: Impaired Speech Info: Impaired    SPECIAL CARE FACTORS FREQUENCY                       Contractures Contractures Info: Not present    Additional Factors Info  Code Status, Allergies Code Status Info: DNR Allergies Info: Risperidone, Levofloxacin           Current Medications (01/19/2018):  This is the current hospital active medication list Current Facility-Administered Medications  Medication Dose Route Frequency Provider Last Rate Last Dose  . acetaminophen (TYLENOL) tablet 650 mg  650 mg Oral Q6H PRN Saundra Shelling, MD       Or  . acetaminophen (TYLENOL) suppository 650 mg  650 mg Rectal Q6H PRN Pyreddy, Reatha Harps, MD      . ARIPiprazole (ABILIFY) tablet 3 mg  3 mg Oral Daily Pyreddy, Pavan, MD   3 mg at 01/18/18 1711  . clonazePAM (KLONOPIN) tablet 0.5 mg  0.5 mg Oral BID Saundra Shelling, MD   0.5 mg at 01/18/18  2258  . dextrose 5 % solution   Intravenous Continuous Saundra Shelling, MD 100 mL/hr at 01/19/18 0402    . docusate sodium (COLACE) capsule 100 mg  100 mg Oral BID Pyreddy, Reatha Harps, MD      . feeding supplement (ENSURE ENLIVE) (ENSURE ENLIVE) liquid   Oral QID Saundra Shelling, MD   237 mL at 01/18/18 2258  . finasteride (PROSCAR) tablet 5 mg  5 mg Oral Daily Pyreddy, Pavan, MD      . FLUoxetine (PROZAC) capsule 20 mg  20 mg Oral Lesia Sago,  MD   20 mg at 01/19/18 0629  . guaiFENesin (ROBITUSSIN) 100 MG/5ML solution 100 mg  100 mg Oral Q4H PRN Saundra Shelling, MD      . heparin injection 5,000 Units  5,000 Units Subcutaneous Q8H Saundra Shelling, MD   5,000 Units at 01/19/18 0629  . magnesium hydroxide (MILK OF MAGNESIA) suspension 30 mL  30 mL Oral Daily PRN Pyreddy, Reatha Harps, MD      . memantine (NAMENDA XR) 24 hr capsule 28 mg  28 mg Oral Daily Pyreddy, Pavan, MD      . multivitamin with minerals tablet 1 tablet  1 tablet Oral Daily Saundra Shelling, MD   1 tablet at 01/18/18 1712  . ondansetron (ZOFRAN) tablet 4 mg  4 mg Oral Q6H PRN Pyreddy, Reatha Harps, MD       Or  . ondansetron (ZOFRAN) injection 4 mg  4 mg Intravenous Q6H PRN Pyreddy, Pavan, MD      . pantoprazole (PROTONIX) EC tablet 40 mg  40 mg Oral Daily Pyreddy, Pavan, MD      . polyethylene glycol (MIRALAX / GLYCOLAX) packet 17 g  17 g Oral Daily Pyreddy, Pavan, MD      . sucralfate (CARAFATE) 1 GM/10ML suspension 1 g  1 g Oral BID AC Pyreddy, Pavan, MD   1 g at 01/18/18 1712  . tamsulosin (FLOMAX) capsule 0.4 mg  0.4 mg Oral Daily Pyreddy, Reatha Harps, MD   0.4 mg at 01/18/18 1712  . traMADol (ULTRAM) tablet 50 mg  50 mg Oral TID PRN Saundra Shelling, MD         Discharge Medications: Please see discharge summary for a list of discharge medications.  Relevant Imaging Results:  Relevant Lab Results:   Additional Information    Acasia Skilton  Louretta Shorten, LCSWA

## 2018-01-19 NOTE — Progress Notes (Signed)
New hospice home referral received from [place] following a Palliative medicine consult. Patient is a 62 year old man with a past medical history of cerebral palsy, HTN, congenital deafness, blindness and adenocarcinoma of the cecum brought  to [facility] ED [name] for assessment of weakness and poor oral intake. In the ED he was found to be hypernatremic with elevated lactic acid. He was admitted for treatment and palliative medicine was consulted for goals of care. Palliative Medicine NP Asencion Gowda met with patient's brother/HCPOA Nijee Heatwole and brother Ronalee Belts, who have chosen to focus on comfort with transfer to the hospice home. Writer met in the room with Marlou Sa and Ronalee Belts to initiate education regarding hospice services, philosophy and team approach to care with good understanding voiced. Questions answered, consents signed. Patient noted with increased secretions, some quiet moaning noted during visit.  Plan is for transfer to the hospice home tomorrow 7/24 via EMS. Signed DNR in place in patient's chart. Hospital care team updated. Patient information faxed to referral. Thank you for the opportunity to be involved in the care of this patient and his family. Flo Shanks RN, BSN, [place] [facility] [place], hospital Liaison [phone]

## 2018-01-19 NOTE — Clinical Social Work Note (Signed)
CSW met with patient's guardian Sigfredo Schreier and other brother Vijay Durflinger to discuss discharge plan. Family has met with Palliative care and they have decided they would like patient to go to Hospice home. CSW offered choice of hospice home and they would like [facility]. CSW notified [place] [facility] liaison. CSW will continue to follow for discharge planning.   [place], [place]

## 2018-01-19 NOTE — Progress Notes (Signed)
Shawn Sweeney ([id number]) Visit Report for 01/14/2018 Chief Complaint Document Details Patient Name: Shawn Sweeney, Shawn Sweeney. Date of Service: 01/14/2018 12:30 PM Medical Record Number: [id number] Patient Account Number: [id number] Date of Birth/Sex: 1955/07/13 (62 y.o. M) Treating RN: Ahmed Prima Primary Care Provider: Ramonita Lab Other Clinician: Referring Provider: Ramonita Lab Treating Provider/Extender: Cathie Olden in Treatment: 4 Information Obtained from: Patient Chief Complaint multiple wounds Electronic Signature(s) Signed: 01/14/2018 4:32:30 PM By: Lawanda Cousins Entered By: Lawanda Cousins on 01/14/2018 16:32:29 Shawn Sweeney ([id number]) -------------------------------------------------------------------------------- Debridement Details Patient Name: Shawn Sweeney. Date of Service: 01/14/2018 12:30 PM Medical Record Number: [id number] Patient Account Number: [id number] Date of Birth/Sex: [birth year] (62 y.o. M) Treating RN: Ahmed Prima Primary Care Provider: Ramonita Lab Other Clinician: Referring Provider: Ramonita Lab Treating Provider/Extender: Cathie Olden in Treatment: 4 Debridement Performed for Wound #2 Right Trochanter Assessment: Performed By: Physician Lawanda Cousins, NP Debridement Type: Debridement Pre-procedure Verification/Time Yes - 13:01 Out Taken: Start Time: 13:03 Pain Control: Lidocaine 4% Topical Solution Total Area Debrided (L x W): 1 (cm) x 2.2 (cm) = 2.2 (cm) Tissue and other material Non-Viable, Eschar, Subcutaneous debrided: Level: Skin/Subcutaneous Tissue Debridement Description: Excisional Instrument: Curette Bleeding: Minimum Hemostasis Achieved: Pressure End Time: 13:04 Procedural Pain: 0 Post Procedural Pain: 0 Response to Treatment: Procedure was tolerated well Level of Consciousness: Awake and Alert Post Debridement Measurements of Total Wound Length: (cm) 1 Stage: Unstageable/Unclassified Width: (cm)  2.2 Depth: (cm) 0.2 Volume: (cm) 0.346 Character of Wound/Ulcer Post Requires Further Debridement Debridement: Post Procedure Diagnosis Same as Pre-procedure Electronic Signature(s) Signed: 01/14/2018 4:31:47 PM By: Lawanda Cousins Signed: 01/18/2018 5:20:31 PM By: Alric Quan Previous Signature: 01/14/2018 3:48:25 PM Version By: Alric Quan Entered By: Lawanda Cousins on 01/14/2018 16:31:47 Shawn Sweeney ([id number]) -------------------------------------------------------------------------------- Debridement Details Patient Name: Shawn Sweeney. Date of Service: 01/14/2018 12:30 PM Medical Record Number: [id number] Patient Account Number: [id number] Date of Birth/Sex: [birth year] (62 y.o. M) Treating RN: Ahmed Prima Primary Care Provider: Ramonita Lab Other Clinician: Referring Provider: Ramonita Lab Treating Provider/Extender: Cathie Olden in Treatment: 4 Debridement Performed for Wound #4 Left Toe Great Assessment: Performed By: Physician Lawanda Cousins, NP Debridement Type: Debridement Pre-procedure Verification/Time Yes - 13:01 Out Taken: Start Time: 13:01 Pain Control: Lidocaine 4% Topical Solution Total Area Debrided (L x W): 1 (cm) x 0.3 (cm) = 0.3 (cm) Tissue and other material Non-Viable, Skin: Epidermis, Fibrin/Exudate debrided: Level: Skin/Epidermis Debridement Description: Selective/Open Wound Instrument: Curette Bleeding: Minimum Hemostasis Achieved: Pressure End Time: 13:02 Procedural Pain: 0 Post Procedural Pain: 0 Response to Treatment: Procedure was tolerated well Level of Consciousness: Awake and Alert Post Debridement Measurements of Total Wound Length: (cm) 0.1 Width: (cm) 0.1 Depth: (cm) 0.1 Volume: (cm) 0.001 Character of Wound/Ulcer Post Debridement: Requires Further Debridement Post Procedure Diagnosis Same as Pre-procedure Electronic Signature(s) Signed: 01/14/2018 4:32:20 PM By: Lawanda Cousins Signed: 01/18/2018  5:20:31 PM By: Alric Quan Previous Signature: 01/14/2018 3:48:25 PM Version By: Alric Quan Entered By: Lawanda Cousins on 01/14/2018 16:32:20 Shawn Sweeney ([id number]) -------------------------------------------------------------------------------- HPI Details Patient Name: Shawn Sweeney. Date of Service: 01/14/2018 12:30 PM Medical Record Number: [id number] Patient Account Number: [id number] Date of Birth/Sex: Nov 28, 1955 (62 y.o. M) Treating RN: Ahmed Prima Primary Care Provider: Ramonita Lab Other Clinician: Referring Provider: Ramonita Lab Treating Provider/Extender: Cathie Olden in Treatment: 4 History of Present Illness HPI Description: 12/17/17- He presents for initial evaluation for multiple areas of pressure injury. He resides is a group home and is accompanied by staff that are  covering for his regular caregivers. 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 He appears like adult ftt; it is reported that he has had a significant weight loss. I will reach out to the group home staff on Monday for more details Shawn Sweeney [phone]). 12/24/17-He presents for follow-up evaluation for multiple areas of pressure. According to the staff member that is present, there was a meeting to discuss his overall condition, there are plans to evaluate for cancer given his history of cancer and recent weight loss despite a good appetite and supplements provided by the facility. The left trochanter is progressed to a stage II with areas of deep tissue injury; the sacrococcygeal area has blanchable erythema. We have discussed more appropriate offloading techniques. We will continue with medihoney and he will follow up in two weeks 01/07/18-He is seen in follow-up evaluation for multiple areas of pressure. Pressure injuries to the right shoulders have healed. The wound to the left great toe is improved. There continues to be deterioration and new areas of  pressure to the bilateral trochanter. A lengthy discussion was had with facility staff regarding offloading, maintaining a more supine position versus lateral offloading. They have received a gel mattress overlay. We discussed the possibility of a palliative change in his overall health status. According to the staff that is present with him today he has only had a 3 pound weight loss since last October and does not tolerate an increase in quantity. They have been attempting to feed him high-protein diet, they do state that on most recent labs his primary care was expressing concern for dehydration. We will continue with medihoney to the left great toe, protect the left trochanter with foam border and initiate santyl to the right trochanter 01/14/18-he is seen in follow-up evaluation for bilateral trochanteric pressure ulcers. The left great toe is nearly healed. The bilateral trochanter ulcers have not deteriorated and there is no new pressure injury to the sacrum. Facility staff states that he was grossly impacted and has been having multiple stools; since disimpaction his appetite has improved. He is being supplemented with pro-Cal. We will continue with same treatment plan and he will follow-up next week; his family has completed a most form Electronic Signature(s) Signed: 01/14/2018 4:34:13 PM By: Lawanda Cousins Entered By: Lawanda Cousins on 01/14/2018 [place], [place] ([id number]) -------------------------------------------------------------------------------- Physician Orders Details Patient Name: Shawn Sweeney. Date of Service: 01/14/2018 12:30 PM Medical Record Number: [id number] Patient Account Number: [id number] Date of Birth/Sex: 06-17-1956 (62 y.o. M) Treating RN: Ahmed Prima Primary Care Provider: Ramonita Lab Other Clinician: Referring Provider: Ramonita Lab Treating Provider/Extender: Cathie Olden in Treatment: 4 Verbal / Phone Orders: Yes Clinician: Carolyne Fiscal,  Debi Read Back and Verified: Yes Diagnosis Coding Wound Cleansing Wound #2 Right Trochanter o Clean wound with Normal Saline. o Cleanse wound with mild soap and water o May Shower, gently pat wound dry prior to applying new dressing. Wound #3 Left Trochanter o Clean wound with Normal Saline. o Cleanse wound with mild soap and water o May Shower, gently pat wound dry prior to applying new dressing. Wound #4 Left Toe Great o Clean wound with Normal Saline. o Cleanse wound with mild soap and water o May Shower, gently pat wound dry prior to applying new dressing. Anesthetic (add to Medication List) Wound #2 Right Trochanter o Topical Lidocaine 4% cream applied to wound bed prior to debridement (In Clinic Only). Wound #3 Left Trochanter o Topical Lidocaine 4% cream  applied to wound bed prior to debridement (In Clinic Only). Wound #4 Left Toe Great o Topical Lidocaine 4% cream applied to wound bed prior to debridement (In Clinic Only). Primary Wound Dressing Wound #2 Right Trochanter o Saline moistened gauze o Santyl Ointment Wound #3 Left Trochanter o Saline moistened gauze o Santyl Ointment Secondary Dressing Wound #2 Right Trochanter o Dry Gauze o [place] Wound #3 Left Trochanter o Dry Gauze o [place] ([id number]) Wound #4 Left Toe Great o Gauze and Kerlix/Conform Dressing Change Frequency Wound #2 Right Trochanter o Change dressing every day. Wound #3 Left Trochanter o Change dressing every day. Wound #4 Left Toe Great o Change Dressing Monday, Wednesday, Friday Follow-up Appointments Wound #2 Right Trochanter o Return Appointment in 1 week. Wound #3 Left Trochanter o Return Appointment in 1 week. Wound #4 Left Toe Great o Return Appointment in 1 week. Off-Loading Wound #2 Right Trochanter o Mattress o Turn and reposition every 2 hours - turn pt 30 degrees when turning right to  left; use pillows to offload/reposition may include back lying in reposition schedule recline when at day program to offload ischial and trochanter ulcers o Other: - hospital bed Wound #3 Left Trochanter o Mattress o Turn and reposition every 2 hours - turn pt 30 degrees when turning right to left; use pillows to offload/reposition may include back lying in reposition schedule recline when at day program to offload ischial and trochanter ulcers o Other: - hospital bed Wound #4 Left Toe Great o Mattress o Turn and reposition every 2 hours - turn pt 30 degrees when turning right to left; use pillows to offload/reposition may include back lying in reposition schedule recline when at day program to offload ischial and trochanter ulcers o Other: - hospital bed Additional Orders / Instructions Wound #2 Right Trochanter o Vitamin A; Vitamin C, Zinc o Increase protein intake. o Other: - Please add Benecal to pts food. Wound #3 Left Trochanter o Vitamin A; Vitamin C, Zinc o Increase protein intake. o Other: - Please add Benecal to pts food. Shawn Sweeney, Shawn Sweeney ([id number]) Wound #4 Left Toe Great o Vitamin A; Vitamin C, Zinc o Increase protein intake. o Other: - Please add Benecal to pts food. Patient Medications Allergies: Levaquin, Risperdal Notifications Medication Indication Start End lidocaine DOSE 1 - topical 4 % cream - 1 cream topical Electronic Signature(s) Signed: 01/14/2018 3:48:25 PM By: Alric Quan Signed: 01/14/2018 4:35:32 PM By: Lawanda Cousins Entered By: Alric Quan on 01/14/2018 13:08:22 Shawn Sweeney ([id number]) -------------------------------------------------------------------------------- Prescription 01/14/2018 Patient Name: Shawn Sweeney. Provider: Lawanda Cousins NP Date of Birth: 30-Sep-1955 NPI#: [id number] Sex: Jerilynn Mages DEA#: [id number] Phone #: [phone] License #: Patient Address: [facility] [place], [place] [address] [address] [place], [place] [address] [phone] Allergies Levaquin Risperdal Medication Medication: Route: Strength: Form: lidocaine topical 4% cream Class: TOPICAL LOCAL ANESTHETICS Dose: Frequency / Time: Indication: 1 1 cream topical Number of Refills: Number of Units: 0 Generic Substitution: Start Date: End Date: Administered at [place]: Yes Time Administered: Time Discontinued: Note to Pharmacy: Signature(s): Date(s): Electronic Signature(s) Signed: 01/14/2018 3:48:25 PM By: Alric Quan Signed: 01/14/2018 4:35:32 PM By: Dellia Cloud, [facility] ([id number]) Entered By: Alric Quan on 01/14/2018 13:08:24 Shawn Sweeney ([id number]) --------------------------------------------------------------------------------  Problem List Details Patient Name: Shawn Sweeney, Shawn Sweeney. Date of Service: 01/14/2018 12:30 PM Medical Record Number: [id number] Patient Account Number: [id number] Date  of Birth/Sex: Oct 22, 1955 (62 y.o. M) Treating RN: Ahmed Prima Primary Care Provider: Ramonita Lab Other Clinician: Referring Provider: Ramonita Lab Treating Provider/Extender: Cathie Olden in Treatment: 4 Active Problems ICD-10 Evaluated Encounter Code Description Active Date Today Diagnosis L89.210 Pressure ulcer of right hip, unstageable 01/07/2018 No Yes L89.220 Pressure ulcer of left hip, unstageable 01/14/2018 No Yes S91.102S Unspecified open wound of left great toe without damage to 12/17/2017 No Yes nail, sequela R64 Cachexia 12/17/2017 No Yes Inactive Problems Resolved Problems Electronic Signature(s) Signed: 01/14/2018 4:31:13 PM By: Lawanda Cousins Entered By: Lawanda Cousins on 01/14/2018 Shawn Sweeney, Shawn Sweeney ([id number]) -------------------------------------------------------------------------------- Progress Note  Details Patient Name: Shawn Sweeney. Date of Service: 01/14/2018 12:30 PM Medical Record Number: [id number] Patient Account Number: [id number] Date of Birth/Sex: [birth year] (62 y.o. M) Treating RN: Ahmed Prima Primary Care Provider: Ramonita Lab Other Clinician: Referring Provider: Ramonita Lab Treating Provider/Extender: Cathie Olden in Treatment: 4 Subjective Chief Complaint Information obtained from Patient multiple wounds History of Present Illness (HPI) 12/17/17- He presents for initial evaluation for multiple areas of pressure injury. He resides is a group home and is accompanied by staff that are covering for his regular caregivers. 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 He appears like adult ftt; it is reported that he has had a significant weight loss. I will reach out to the group home staff on Monday for more details Shawn Sweeney [phone]). 12/24/17-He presents for follow-up evaluation for multiple areas of pressure. According to the staff member that is present, there was a meeting to discuss his overall condition, there are plans to evaluate for cancer given his history of cancer and recent weight loss despite a good appetite and supplements provided by the facility. The left trochanter is progressed to a stage II with areas of deep tissue injury; the sacrococcygeal area has blanchable erythema. We have discussed more appropriate offloading techniques. We will continue with medihoney and he will follow up in two weeks 01/07/18-He is seen in follow-up evaluation for multiple areas of pressure. Pressure injuries to the right shoulders have healed. The wound to the left great toe is improved. There continues to be deterioration and new areas of pressure to the bilateral trochanter. A lengthy discussion was had with facility staff regarding offloading, maintaining a more supine position versus lateral offloading. They have received a gel  mattress overlay. We discussed the possibility of a palliative change in his overall health status. According to the staff that is present with him today he has only had a 3 pound weight loss since last October and does not tolerate an increase in quantity. They have been attempting to feed him high-protein diet, they do state that on most recent labs his primary care was expressing concern for dehydration. We will continue with medihoney to the left great toe, protect the left trochanter with foam border and initiate santyl to the right trochanter 01/14/18-he is seen in follow-up evaluation for bilateral trochanteric pressure ulcers. The left great toe is nearly healed. The bilateral trochanter ulcers have not deteriorated and there is no new pressure injury to the sacrum. Facility staff states that he was grossly impacted and has been having multiple stools; since disimpaction his appetite has improved. He is being supplemented with pro-Cal. We will continue with same treatment plan and he will follow-up next week; his family has completed a most form Patient History Unable to Obtain Patient History due to Altered Mental Status. Information obtained from Patient. Social  History Never smoker, Marital Status - Single, Alcohol Use - Never, Drug Use - No History, Caffeine Use - Never. 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 Integumentary (Hair, Skin) Wound #2 status is Open. Original cause of wound was Pressure Injury. The wound is located on the Right Trochanter. The wound measures 1cm length x 2.2cm width x  0.1cm depth; 1.728cm^2 area and 0.173cm^3 volume. There is no tunneling or undermining noted. There is a medium amount of serous drainage noted. The wound margin is distinct with the outline attached to the wound base. There is no granulation within the wound bed. There is a large (67-100%) amount of necrotic tissue within the wound bed including Eschar and Adherent Slough. The periwound skin appearance did not exhibit: Callus, Crepitus, Excoriation, Induration, Rash, Scarring, Dry/Scaly, Maceration, Atrophie Blanche, Cyanosis, Ecchymosis, Hemosiderin Staining, Mottled, Pallor, Rubor, Erythema. Periwound temperature was noted as No Abnormality. The periwound has tenderness on palpation. Wound #3 status is Open. Original cause of wound was Pressure Injury. The wound is located on the Left Trochanter. The wound measures 2cm length x 3.2cm width x 0.2cm depth; 5.027cm^2 area and 1.005cm^3 volume. There is Fat Layer (Subcutaneous Tissue) Exposed exposed. There is no tunneling or undermining noted. There is a large amount of serosanguineous drainage noted. The wound margin is distinct with the outline attached to the wound base. There is small (1-33%) pink granulation within the wound bed. There is a large (67-100%) amount of necrotic tissue within the wound bed including Eschar and Adherent Slough. The periwound skin appearance did not exhibit: Callus, Crepitus, Excoriation, Induration, Rash, Scarring, Dry/Scaly, Maceration, Atrophie Blanche, Cyanosis, Ecchymosis, Hemosiderin Staining, Mottled, Pallor, Rubor, Erythema. Periwound temperature was noted as No Abnormality. Wound #4 status is Open. Original cause of wound was Gradually Appeared. The wound is located on the Left Toe Great. The wound measures 1cm length x 0.3cm width x 0.1cm depth; 0.236cm^2 area and 0.024cm^3 volume. There is no tunneling or undermining noted. There is a medium amount of serous drainage noted. The wound margin is flat and  intact. There is no granulation within the wound bed. There is a large (67-100%) amount of necrotic tissue within the wound bed including Eschar. The periwound skin appearance did not exhibit: Callus, Crepitus, Excoriation, Induration, Rash, Scarring, Dry/Scaly, Maceration, Atrophie Blanche, Cyanosis, Ecchymosis, Hemosiderin Staining, Mottled, Pallor, Rubor, Erythema. Periwound temperature was noted as No Abnormality. Assessment Active Problems Shawn Sweeney, Shawn Sweeney ([id number]) ICD-10 Pressure ulcer of right hip, unstageable Pressure ulcer of left hip, unstageable Unspecified open wound of left great toe without damage to nail, sequela Cachexia Procedures Wound #2 Pre-procedure diagnosis of Wound #2 is a Pressure Ulcer located on the Right Trochanter . There was a Excisional Skin/Subcutaneous Tissue Debridement with a total area of 2.2 sq cm performed by Lawanda Cousins, NP. With the following instrument(s): Curette to remove Non-Viable tissue/material. Material removed includes Eschar and Subcutaneous Tissue and after achieving pain control using Lidocaine 4% Topical Solution. No specimens were taken. A time out was conducted at 13:01, prior to the start of the procedure. A Minimum amount of bleeding was controlled with Pressure. The procedure was tolerated well with a pain level of 0 throughout and a pain level of 0 following the procedure. Patient s Level  of Consciousness post procedure was recorded as Awake and Alert. Post Debridement Measurements: 1cm length x 2.2cm width x 0.2cm depth; 0.346cm^3 volume. Post debridement Stage noted as Unstageable/Unclassified. Character of Wound/Ulcer Post Debridement requires further debridement. Post procedure Diagnosis Wound #2: Same as Pre-Procedure Wound #4 Pre-procedure diagnosis of Wound #4 is an Atypical located on the Left Toe Great . There was a Selective/Open Wound Skin/Epidermis Debridement with a total area of 0.3 sq cm performed by Lawanda Cousins, NP. With the following instrument(s): Curette to remove Non-Viable tissue/material. Material removed includes Skin: Epidermis and Fibrin/Exudate and after achieving pain control using Lidocaine 4% Topical Solution. No specimens were taken. A time out was conducted at 13:01, prior to the start of the procedure. A Minimum amount of bleeding was controlled with Pressure. The procedure was tolerated well with a pain level of 0 throughout and a pain level of 0 following the procedure. Patient s Level of Consciousness post procedure was recorded as Awake and Alert. Post Debridement Measurements: 0.1cm length x 0.1cm width x 0.1cm depth; 0.001cm^3 volume. Character of Wound/Ulcer Post Debridement requires further debridement. Post procedure Diagnosis Wound #4: Same as Pre-Procedure Plan Wound Cleansing: Wound #2 Right Trochanter: Clean wound with Normal Saline. Cleanse wound with mild soap and water May Shower, gently pat wound dry prior to applying new dressing. Wound #3 Left Trochanter: Clean wound with Normal Saline. Cleanse wound with mild soap and water May Shower, gently pat wound dry prior to applying new dressing. Wound #4 Left Toe Great: Clean wound with Normal Saline. Cleanse wound with mild soap and water May Shower, gently pat wound dry prior to applying new dressing. Shawn Sweeney, Shawn Sweeney ([id number]) Anesthetic (add to Medication List): Wound #2 Right Trochanter: Topical Lidocaine 4% cream applied to wound bed prior to debridement (In Clinic Only). Wound #3 Left Trochanter: Topical Lidocaine 4% cream applied to wound bed prior to debridement (In Clinic Only). Wound #4 Left Toe Great: Topical Lidocaine 4% cream applied to wound bed prior to debridement (In Clinic Only). Primary Wound Dressing: Wound #2 Right Trochanter: Saline moistened gauze Santyl Ointment Wound #3 Left Trochanter: Saline moistened gauze Santyl Ointment Secondary Dressing: Wound #2 Right  Trochanter: Dry Gauze [place] Wound #3 Left Trochanter: Dry Gauze [place] Wound #4 Left Toe Great: Gauze and Kerlix/Conform Dressing Change Frequency: Wound #2 Right Trochanter: Change dressing every day. Wound #3 Left Trochanter: Change dressing every day. Wound #4 Left Toe Great: Change Dressing Monday, Wednesday, Friday Follow-up Appointments: Wound #2 Right Trochanter: Return Appointment in 1 week. Wound #3 Left Trochanter: Return Appointment in 1 week. Wound #4 Left Toe Great: Return Appointment in 1 week. Off-Loading: Wound #2 Right Trochanter: Mattress Turn and reposition every 2 hours - turn pt 30 degrees when turning right to left; use pillows to offload/reposition may include back lying in reposition schedule recline when at day program to offload ischial and trochanter ulcers Other: - hospital bed Wound #3 Left Trochanter: Mattress Turn and reposition every 2 hours - turn pt 30 degrees when turning right to left; use pillows to offload/reposition may include back lying in reposition schedule recline when at day program to offload ischial and trochanter ulcers Other: - hospital bed Wound #4 Left Toe Great: Mattress Turn and reposition every 2 hours - turn pt 30 degrees when turning right to left; use pillows to offload/reposition may include back lying in reposition schedule recline when at day program to offload ischial and trochanter ulcers Other: - hospital bed Additional Orders /  Instructions: Wound #2 Right Trochanter: Vitamin A; Vitamin C, Zinc Increase protein intake. Other: - Please add Benecal to pts food. Wound #3 Left Trochanter: Vitamin A; Vitamin C, Zinc Karas, Caedyn L. ([id number]) Increase protein intake. Other: - Please add Benecal to pts food. Wound #4 Left Toe Great: Vitamin A; Vitamin C, Zinc Increase protein intake. Other: - Please add Benecal to pts food. The following medication(s) was prescribed: lidocaine topical 4 %  cream 1 1 cream topical was prescribed at facility Electronic Signature(s) Signed: 01/14/2018 4:34:33 PM By: Lawanda Cousins Entered By: Lawanda Cousins on 01/14/2018 [place], [place] ([id number]) -------------------------------------------------------------------------------- ROS/PFSH Details Patient Name: Shawn Sweeney. Date of Service: 01/14/2018 12:30 PM Medical Record Number: [id number] Patient Account Number: [id number] Date of Birth/Sex: [birth year] (62 y.o. M) Treating RN: Ahmed Prima Primary Care Provider: Ramonita Lab Other Clinician: Referring Provider: Ramonita Lab Treating Provider/Extender: Cathie Olden in Treatment: 4 Unable to Obtain Patient History due to oo Altered Mental Status Information Obtained From Patient Wound History Do you currently have one or more open woundso Yes How many open wounds do you currently haveo 2 Approximately how long have you had your woundso 3 weeks How have you been treating your wound(s) until nowo gauze Has your wound(s) ever healed and then re-openedo No Have you had any lab work done in the past montho No Have you tested positive for an antibiotic resistant organism (MRSA, VRE)o No Have you tested positive for osteomyelitis (bone infection)o No Have you had any tests for circulation on your legso No Eyes Medical History: Negative for: Cataracts; Glaucoma; Optic Neuritis Past Medical History Notes: blind Ear/Nose/Mouth/Throat Medical History: Negative for: Chronic sinus problems/congestion; Middle ear problems Past Medical History Notes: deaf, 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 ([id number]) Gastrointestinal Medical History: Negative for: Cirrhosis ; Colitis; Crohnos; Hepatitis A; Hepatitis B; Hepatitis C Past Medical History Notes: manutrition Endocrine Medical History: Negative for: Type I Diabetes; Type II Diabetes Genitourinary Medical History: Negative for: End Stage Renal Disease Past Medical History Notes: congenital phimosis Immunological Medical History: Negative for: Lupus Erythematosus; Raynaudos; Scleroderma Integumentary (Skin) Medical History: Negative for: History of Burn; History of pressure wounds Musculoskeletal Medical History: Negative for: Gout; Rheumatoid Arthritis; Osteoarthritis; Osteomyelitis Past Medical History Notes: contracted Neurologic Medical History: Negative for: Dementia; Neuropathy; Quadriplegia; Paraplegia; Seizure Disorder Past Medical History Notes: congenital diplegia Oncologic Medical History: Negative for: Received Chemotherapy; Received Radiation Psychiatric Medical History: Past Medical History Notes: mood disorder Immunizations Pneumococcal Vaccine: Received Pneumococcal Vaccination: Yes Immunization Notes: up to date Shawn Sweeney, Shawn Sweeney ([id number]) Implantable Devices Family and Social History Never smoker; Marital Status - Single; Alcohol Use: Never; Drug Use: No History; Caffeine Use: Never; Financial Concerns: No; Food, Clothing or Shelter Needs: No; Support System Lacking: No; Transportation Concerns: No; Advanced Directives: No; Patient does not want information on Advanced Directives; Medical Power of Attorney: Yes - Shawn Sweeney is guardian (Not Provided) Physician Affirmation I have reviewed and agree with the above information. Electronic  Signature(s) Signed: 01/14/2018 4:35:32 PM By: Lawanda Cousins Signed: 01/18/2018 5:20:31 PM By: Alric Quan Entered By: Lawanda Cousins on 01/14/2018 16:34:21 Shawn Sweeney ([id number]) --------------------------------------------------------------------------------  SuperBill Details Patient Name: Shawn Sweeney, Shawn Sweeney. Date of Service: 01/14/2018 Medical Record Number: [id number] Patient Account Number: [id number] Date of Birth/Sex: [birth year] (62 y.o. M) Treating RN: Ahmed Prima Primary Care Provider: Ramonita Lab Other Clinician: Referring Provider: Ramonita Lab Treating Provider/Extender: Cathie Olden in Treatment: 4 Diagnosis Coding ICD-10 Codes Code Description L89.210 Pressure ulcer of right hip, unstageable L89.220 Pressure ulcer of left hip, unstageable S91.102S Unspecified open wound of left great toe without damage to nail, sequela R64 Cachexia Facility Procedures CPT4 Code: 76734193 Description: 79024 - DEB SUBQ TISSUE 20 SQ CM/< ICD-10 Diagnosis Description L89.210 Pressure ulcer of right hip, unstageable Modifier: Quantity: 1 CPT4 Code: 09735329 Description: 92426 - DEBRIDE WOUND 1ST 20 SQ CM OR < ICD-10 Diagnosis Description S91.102S Unspecified open wound of left great toe without damage to n Modifier: ail, sequela Quantity: 1 Physician Procedures CPT4 Code: 8341962 Description: 11042 - WC PHYS SUBQ TISS 20 SQ CM ICD-10 Diagnosis Description L89.210 Pressure ulcer of right hip, unstageable Modifier: Quantity: 1 CPT4 Code: 2297989 Description: 21194 - WC PHYS DEBR WO ANESTH 20 SQ CM ICD-10 Diagnosis Description S91.102S Unspecified open wound of left great toe without damage to n Modifier: ail, sequela Quantity: 1 Electronic Signature(s) Signed: 01/14/2018 4:34:47 PM By: Lawanda Cousins Entered By: Lawanda Cousins on 01/14/2018 16:34:46

## 2018-01-19 NOTE — Care Management Note (Signed)
Case Management Note  Patient Details  Name: JOSHAWN CRISSMAN MRN: [id number] Date of Birth: 05/31/1956  Subjective/Objective:  Admitted to [facility] with the diagnosis of hypernatremia.    A resident of [facility]. Goes to the wound care center for wound care.                Action/Plan: Received referral for home health needs. Will continue to follow for home needs   Expected Discharge Date:                  Expected Discharge Plan:     In-House Referral:   yes  Discharge planning Services     Post Acute Care Choice:    Choice offered to:     DME Arranged:    DME Agency:     HH Arranged:    HH Agency:     Status of Service:     If discussed at [employer], dates discussed:    Additional Comments:  Shelbie Ammons, RN MSN CCM Care Management [phone] 01/19/2018, 10:42 AM

## 2018-01-19 NOTE — Consult Note (Signed)
[place] Nurse wound consult note Reason for Consult: Patient with debilitation, resides in group home. Nutritional compromise and pressure injuries.  Seen and followed by the outpatient [facility] here at [facility]. Wound type:Pressure Pressure Injury POA: Yes Measurement: Right hip:  Unstageable pressure injury, 4.5cm x 8cm area affected, black eschar measures 3cm x 4.2cm. Periwound erythema, no exudate. Left hip: Unstageable pressure injury measuring 3.4cm x 3cm with black eschar firmly attached.  No exudate. Left great toe, lateral aspect:  Healing full thickness wound measuring 1.5cm x 0.4cm x 0.2cm with pink, moist wound bed. Scant exudate. Right shoulder: Partial thickness, Stage 2 pressure injury measuring 5.5cm x 8cm area of erythema with 3cm x 3.8cm area of partial thickness tissue loss.  Moist pink wound bed, scant serous exudate. Wound bed: As described above Drainage (amount, consistency, odor) As described above Periwound: unremarkable Dressing procedure/placement/frequency: Patient is on a support surface with low air loss feature, I will provide pressure redistribution heel boots and a pressure redistribution chair cushion for his use when OOB to a geri-chair at the facility. I will continue the topical wound care that is in place from the [facility] I.e., collagenase (Santyl) to both hips and the right great toe, a nonadherent dressing to the right shoulder.  There are prophylactic silicone foam dressings in place to  the left shoulder and the sacrum-there is not breakdown in these areas.  The patient is wearing a body worn absorbent product (brief) and it is changed appropriately following episodes of incontinence.  There is no incontinence associated dermatitis present nor skin fold (intertriginous) dermatitis despite contractures of the LE at hip.  [place] nursing team will not follow, but will remain available to this patient, the nursing and medical teams.  Please re-consult if needed. Thanks, Maudie Flakes, MSN, RN, [place], Arther Abbott  Pager# [phone]

## 2018-01-19 NOTE — Progress Notes (Addendum)
Initial Nutrition Assessment  DOCUMENTATION CODES:   Severe malnutrition in context of chronic illness, Underweight  INTERVENTION:  Monitor magnesium, potassium, and phosphorus daily for at least 3 days, MD to replete as needed, as pt is at risk for refeeding syndrome given severe malnutrition.  Discontinued Ensure Enlive as it is not nectar-thick.  Provide Hormel Shake (Vital Cuisine) TID with trays, each supplement provides 520 kcal and 22 grams of protein.  Provide Magic cup TID with meals, each supplement provides 290 kcal and 9 grams of protein.  Continue MVI daily.  Provide Ocuvite daily to promote wound healing (contains zinc and vitamin C).  Provide additional vitamin C 250 mg BID.  Continue 1:1 assistance at meals.  Will monitor outcome of discussions regarding goals of care.  NUTRITION DIAGNOSIS:   Severe Malnutrition related to chronic illness(cerebral palsy, dysphagia) as evidenced by severe fat depletion, severe muscle depletion.  GOAL:   Patient will meet greater than or equal to 90% of their needs  MONITOR:   PO intake, Supplement acceptance, Labs, Weight trends, Skin, I & O's  REASON FOR ASSESSMENT:   Malnutrition Screening Tool, Low Braden    ASSESSMENT:   62 year old male with PMHx of cerebral palsy, developmental delay, congenital deafness, blindness, HTN, nephrolithiasis, BPH, anemia, hx of adenocarcinoma of cecum, GERD who is now admitted from [name] with lethargy and weakness found to have acute hypernatremia, dehydration, elevated lactic acid.   -Pending PMT consult.  Met with patient and his caregiver at bedside. Patient is unable to provide any history. Caregiver reports he used to live at the house she works at 2 years ago but has since moved to a different home. She reports he used to have a good appetite and intake but it has been declining recently. His baseline diet is dysphagia 1 with nectar-thick liquids, which he is  currently ordered for.  Caregiver reports patient was 99-100 lbs last year. He was 64.125 lbs (29.1 kg) on admission. That is a reported weight loss of at least 35 lbs (35.4% body weight) over the past year, which is significant for time frame.  Meal Completion: 10-50%  Medications reviewed and include: clonazepam, Colace, MVI daily, pantoprazole, Miralax, Carafate 1 gram BID at 0800 and 1700, Flomax, D5W at 100 mL/hr (120 grams dextrose, 408 kcal daily).  Labs reviewed: Sodium 153, Potassium 3.4, Chloride 120, BUN 27.  NUTRITION - FOCUSED PHYSICAL EXAM:    Most Recent Value  Orbital Region  Severe depletion  Upper Arm Region  Severe depletion  Thoracic and Lumbar Region  Severe depletion  Buccal Region  Severe depletion  Temple Region  Severe depletion  Clavicle Bone Region  Severe depletion  Clavicle and Acromion Bone Region  Severe depletion  Scapular Bone Region  Severe depletion  Dorsal Hand  Severe depletion  Patellar Region  Severe depletion  Anterior Thigh Region  Severe depletion  Posterior Calf Region  Severe depletion  Edema (RD Assessment)  None  Hair  Reviewed  Eyes  Reviewed  Mouth  Reviewed [edentulous]  Skin  Reviewed  Nails  Reviewed     Diet Order:   Diet Order           DIET - DYS 1 Room service appropriate? Yes; Fluid consistency: Nectar Thick  Diet effective now          EDUCATION NEEDS:   Not appropriate for education at this time  Skin:  Skin Assessment: Skin Integrity Issues: Skin Integrity Issues:: Stage  I, Stage II, Unstageable Stage I: medial coccyx, left heel, bilateral pretibial, left medial shoulder Stage II: right shoulder non-healing Unstageable: right hip (5cm x 3.5cm) non-healing, left ischial tuberosity non-healing  Last BM:  01/19/2018 - small type 5  Height:   Ht Readings from Last 1 Encounters:  01/18/18 5' (1.524 m)    Weight:   Wt Readings from Last 1 Encounters:  01/18/18 64 lb (29 kg)    Ideal Body Weight:  45.5  kg  BMI:  Body mass index is 12.5 kg/m.  Estimated Nutritional Needs:   Kcal:  1220-1405 (MSJ x 1.3-1.5)  Protein:  60 grams (2 grams/kg)  Fluid:  >/= 1.2-1.4 L/day  Willey Blade, MS, RD, LDN Office: [phone] Pager: [phone] After Hours/Weekend Pager: [phone]

## 2018-01-19 NOTE — Progress Notes (Signed)
Shawn Sweeney at Shawn Sweeney NAME: Shawn Sweeney    MR#:  [id number]  DATE OF BIRTH:  1955/09/12  SUBJECTIVE:  CHIEF COMPLAINT:   Chief Complaint  Patient presents with  . Weakness   Sent from group home because of decreased oral intake and severe dehydration. Patient is nonverbal, on IV fluid has some improvement in sodium level.  Lethargic and overall in very poor health.  His caretaker is sitting in the room. REVIEW OF SYSTEMS:   Due to baseline mental status and nonverbal, he cannot give the ROS. ROS  DRUG ALLERGIES:   Allergies  Allergen Reactions  . Risperidone Hives and Other (See Comments)  . Levofloxacin Other (See Comments) and Rash    VITALS:  Blood pressure (!) 91/52, pulse 71, temperature 98.2 F (36.8 C), temperature source Oral, resp. rate 20, height 5' (1.524 m), weight 29 kg (64 lb), SpO2 100 %.  PHYSICAL EXAMINATION:  GENERAL:  62 y.o.-year-old very thin patient lying in the bed with no acute distress.  EYES: Pupils equal, round, reactive to light , No scleral icterus. Extraocular muscles intact.  HEENT: Head atraumatic, normocephalic. Oropharynx and nasopharynx clear.  NECK:  Supple, no jugular venous distention. No thyroid enlargement, no tenderness.  LUNGS: Normal breath sounds bilaterally, no wheezing, rales,rhonchi or crepitation. No use of accessory muscles of respiration.  CARDIOVASCULAR: S1, S2 normal. No murmurs, rubs, or gallops.  ABDOMEN: Soft, nontender, nondistended. Bowel sounds present. No organomegaly or mass.  EXTREMITIES: No pedal edema, cyanosis, or clubbing. Severe atrophy.  NEUROLOGIC: can not check as pt is lethargic. PSYCHIATRIC: The patient is lethargic. SKIN: no rash.  Physical Exam LABORATORY PANEL:   CBC Recent Labs  Lab 01/19/18 0636  WBC 11.2*  HGB 12.3*  HCT 37.8*  PLT 214    ------------------------------------------------------------------------------------------------------------------  Chemistries  Recent Labs  Lab 01/18/18 1100  01/19/18 1024  NA 163*   < > 153*  K 4.5   < > 3.4*  CL 127*   < > 120*  CO2 29   < > 26  GLUCOSE 116*   < > 93  BUN 51*   < > 27*  CREATININE 1.14   < > 0.76  CALCIUM 8.6*   < > 8.0*  AST 20  --   --   ALT 17  --   --   ALKPHOS 77  --   --   BILITOT 0.4  --   --    < > = values in this interval not displayed.   ------------------------------------------------------------------------------------------------------------------  Cardiac Enzymes No results for input(s): TROPONINI in the last 168 hours. ------------------------------------------------------------------------------------------------------------------  RADIOLOGY:  Dg Chest Portable 1 View  Result Date: 01/18/2018 CLINICAL DATA:  Low O2 saturation.  Weakness. EXAM: PORTABLE CHEST 1 VIEW COMPARISON:  08/13/2017 FINDINGS: The heart size and pulmonary vascularity are normal and the lungs are clear. No effusions. Chronic thoracolumbar scoliosis. No acute bone abnormality. IMPRESSION: No acute disease in the chest. Electronically Signed   By: Lorriane Shire M.D.   On: 01/18/2018 10:29    ASSESSMENT AND PLAN:   Active Problems:   Hypernatremia   Protein-calorie malnutrition, severe   Pressure injury of skin   62 year old male patient with history of cerebral palsy, developmental retardation, bilateral deafness, blindness, hypertension, adenocarcinoma of cecum presented to the emergency room for lethargy and weakness  -Acute hypernatremia  IV hydration with D5W Follow-up sodium levels closely- some improvement.  -Dehydration-due to decreased oral intake. IV fluids  -  Elevated lactic acid most probably secondary to dehydration- no signs of active infection. Patient received 1 dose of IV antibiotics in the emergency room will follow up the lactic acid  level  -Cerebral palsy with developmental retardation and bilateral deafness and blindness Supportive care  -DVT prophylaxis subcu heparin  -After meeting with palliative care, family had agreed for comfort measures and admission to hospice home.  All the records are reviewed and case discussed with Care Management/Social Workerr. Management plans discussed with the patient, family and they are in agreement.  CODE STATUS: DNR  TOTAL TIME TAKING CARE OF THIS PATIENT: 25 minutes.     POSSIBLE D/C IN 1-2 DAYS, DEPENDING ON CLINICAL CONDITION.   Vaughan Basta M.D on 01/19/2018   Between 7am to 6pm - Pager - [phone]  After 6pm go to [email or web address] - password [email or web address] [facility]  Office  [phone]  CC: Primary care physician; Adin Hector, MD  Note: This dictation was prepared with Dragon dictation along with smaller phrase technology. Any transcriptional errors that result from this process are unintentional.

## 2018-01-19 NOTE — Plan of Care (Signed)
  Problem: Clinical Measurements: Goal: Will remain free from infection Outcome: Progressing Goal: Diagnostic test results will improve Outcome: Progressing Goal: Respiratory complications will improve Outcome: Progressing Goal: Cardiovascular complication will be avoided Outcome: Progressing   Problem: Elimination: Goal: Will not experience complications related to bowel motility Outcome: Progressing

## 2018-01-19 NOTE — Plan of Care (Signed)
  Problem: Education: Goal: Knowledge of General Education information will improve Description: Including pain rating scale, medication(s)/side effects and non-pharmacologic comfort measures Outcome: Progressing   Problem: Elimination: Goal: Will not experience complications related to bowel motility Outcome: Progressing Goal: Will not experience complications related to urinary retention Outcome: Progressing   Problem: Safety: Goal: Ability to remain free from injury will improve Outcome: Progressing   

## 2018-01-19 NOTE — Clinical Social Work Note (Signed)
Clinical Social Work Assessment  Patient Details  Name: Shawn Sweeney MRN: [id number] Date of Birth: 04-18-1956  Date of referral:  01/19/18               Reason for consult:  Facility Placement                Permission sought to share information with:  Case Manager, Customer service manager, Family Supports Permission granted to share information::  Yes, Verbal Permission Granted  Name::        Agency::     Relationship::     Contact Information:     Housing/Transportation Living arrangements for the past 2 months:  Group Home Source of Information:  Guardian Patient Interpreter Needed:  None Criminal Activity/Legal Involvement Pertinent to Current Situation/Hospitalization:  No - Comment as needed Significant Relationships:  Siblings Lives with:  Facility Resident Do you feel safe going back to the place where you live?  Yes Need for family participation in patient care:  Yes (Comment)  Care giving concerns:  Patient is a resident at [facility] Worker assessment / plan:  CSW consulted for facility placement. CSW spoke with patient's guardian Damacio Weisgerber [phone]. CSW introduced self and explained role. Guardian is also patient's brother. Guardian states that patient has lived at [facility] for 20-25 years. Guardian also states that patient is unable to walk at baseline. He is also unable to hear, see or talk per Guardian. Patient is expected to return to [name] when medically ready. CSW will follow for discharge planning.   Employment status:  Disabled (Comment on whether or not currently receiving Disability) Insurance information:  Medicare PT Recommendations:  Not assessed at this time Information / Referral to community resources:     Patient/Family's Response to care:  Patient is unresponsive   Patient/Family's Understanding of and Emotional Response to Diagnosis, Current Treatment, and Prognosis:  Guardian thanked CSW for  assistance   Emotional Assessment Appearance:  Appears stated age Attitude/Demeanor/Rapport:  Unresponsive Affect (typically observed):    Orientation:    Alcohol / Substance use:  Not Applicable Psych involvement (Current and /or in the community):  No (Comment)  Discharge Needs  Concerns to be addressed:  Discharge Planning Concerns Readmission within the last 30 days:  No Current discharge risk:  None Barriers to Discharge:  Continued Medical Work up   [employer], [place] 01/19/2018, 9:10 AM

## 2018-01-19 NOTE — Consult Note (Addendum)
Consultation Note Date: 01/19/2018   Patient Name: Shawn Sweeney  DOB: 1956-03-18  MRN: [id number]  Age / Sex: 62 y.o., male  PCP: Adin Hector, MD Referring Physician: Vaughan Basta, *  Reason for Consultation: Establishing goals of care  HPI/Patient Profile: Shawn Sweeney  is a 62 y.o. male with a known history of cerebral palsy congenital deafness, blindness, adenocarcinoma of the cecum, hypertension, nephrolithiasis was referred from group home for generalized weakness.  Patient is deaf and blind unable to give any history.  Patient's brother power of attorney was at bedside who said patient has become weak and has poor oral intake.     Clinical Assessment and Goals of Care: Patient is resting in bed with eyes closed. He cannot hear or speak. Employees from his group home at bedside. 2 brothers, 1 of which is legal guardian Shawn Sweeney) is at bedside. 3rd brother on phone. They state their mother is living. She is 31 years old. She visits her son every weekend. She has dementia. Shawn Sweeney is her POA.  He began living in a group home over 20 years ago when his mother was no longer able to care for him. He lost is vision in his 40's-50's. He used to work, and was independent. Prior to a month ago, he was able to stand and pivot. In the past month, he has become bed bound with skin breakdown. He has not been eating and drinking well. They have tried Boost and other supplements, and would vomit if he ate too much. His doctor treated constipation over a week ago, but he has ate minimally over the past week.   Here, he is eating spoon fulls of fluid. His brothers feel his quality of life is poor, and he would not want to live as he currently is. They would not want a feeding tube placed. They would like to proceed with comfort care.  Shawn Sweeney is legal guardian.   MOST form completed:  DNR, comfort measures, no abx, no IV fluids, no feeding tube.   SUMMARY OF RECOMMENDATIONS   Recommend transfer to hospice home.    Code Status/Advance Care Planning:  DNR    Symptom Management:   Morphine for pain or dyspnea.   Palliative Prophylaxis:   Eye Care and Oral Care    Prognosis:   < 2 weeks Weighs 64 pounds, BMI is 12.5. Admitted for dehydration and hypernatremia. Electrolyte abnormalities. Sips from spoon. Bedbound. Skin breakdown.   Discharge Planning: Hospice facility      Primary Diagnoses: Present on Admission: . Hypernatremia   I have reviewed the medical record, interviewed the patient and family, and examined the patient. The following aspects are pertinent.  Past Medical History:  Diagnosis Date  . Abdominal wall hernia   . Adenocarcinoma of cecum ([place])   . Anemia   . Aortic atherosclerosis ([place]) 04/11/2016  . Blind   . BPH (benign prostatic hyperplasia)   . Calculus of gallbladder with acute cholecystitis and obstruction   .  Cerebral palsy ([place])   . Congenital deafness   . Dermatophytosis of nail   . Elevated PSA   . GERD (gastroesophageal reflux disease) 01/16/2017  . Hypertension   . Lower extremity edema    echo with normal left and right heart fucntion, mild MR  . Mood disorder ([place])   . Nephrolithiasis   . Onychomycosis   . Phimosis   . Pneumonia 12/18/2014   Social History   Socioeconomic History  . Marital status: Single    Spouse name: Not on file  . Number of children: Not on file  . Years of education: Not on file  . Highest education level: Not on file  Occupational History  . Not on file  Social Needs  . Financial resource strain: Not on file  . Food insecurity:    Worry: Not on file    Inability: Not on file  . Transportation needs:    Medical: Not on file    Non-medical: Not on file  Tobacco Use  . Smoking status: Never Smoker  . Smokeless tobacco: Never Used  Substance and Sexual Activity  . Alcohol  use: No  . Drug use: No  . Sexual activity: Not on file  Lifestyle  . Physical activity:    Days per week: Not on file    Minutes per session: Not on file  . Stress: Not on file  Relationships  . Social connections:    Talks on phone: Not on file    Gets together: Not on file    Attends religious service: Not on file    Active member of club or organization: Not on file    Attends meetings of clubs or organizations: Not on file    Relationship status: Not on file  Other Topics Concern  . Not on file  Social History Narrative  . Not on file   Family History  Problem Relation Age of Onset  . CAD Father   . Prostate cancer Father   . Colon cancer Father    Scheduled Meds: . ARIPiprazole  3 mg Oral Daily  . clonazePAM  0.5 mg Oral BID  . collagenase   Topical Daily  . docusate sodium  100 mg Oral BID  . finasteride  5 mg Oral Daily  . FLUoxetine  20 mg Oral BH-q7a  . heparin  5,000 Units Subcutaneous Q8H  . memantine  28 mg Oral Daily  . multivitamin with minerals  1 tablet Oral Daily  . multivitamin-lutein  1 capsule Oral QHS  . pantoprazole  40 mg Oral Daily  . polyethylene glycol  17 g Oral Daily  . sucralfate  1 g Oral BID AC  . tamsulosin  0.4 mg Oral Daily  . vitamin C  250 mg Oral BID   Continuous Infusions: . dextrose 100 mL/hr at 01/19/18 1423   PRN Meds:.acetaminophen **OR** acetaminophen, guaiFENesin, magnesium hydroxide, ondansetron **OR** ondansetron (ZOFRAN) IV, traMADol Medications Prior to Admission:  Prior to Admission medications   Medication Sig Start Date End Date Taking? Authorizing Provider  acetaminophen (TYLENOL) 325 MG tablet Take 650 mg by mouth every 4 (four) hours as needed for fever.   Yes [provider]  ARIPiprazole (ABILIFY) 2 MG tablet Take 3 mg by mouth daily. Take 1 and a 1/2 tablets by mouth daily.   Yes [provider]  clonazePAM (KLONOPIN) 0.5 MG tablet Take 0.5 mg by mouth 2 (two) times daily.  06/15/12  Yes  [provider]  docusate sodium (  COLACE) 100 MG capsule Take 100 mg by mouth 2 (two) times daily.   Yes [provider]  fexofenadine (ALLEGRA) 180 MG tablet Take 180 mg by mouth daily as needed for allergies or rhinitis.   Yes [provider]  finasteride (PROSCAR) 5 MG tablet Take 1 tablet (5 mg total) by mouth daily. 08/19/17  Yes Stoioff, Ronda Fairly, MD  FLUoxetine (PROZAC) 20 MG capsule Take 20 mg by mouth every morning.    Yes [provider]  furosemide (LASIX) 20 MG tablet Take 20 mg by mouth daily as needed for edema.    Yes [provider]  guaiFENesin (ROBITUSSIN) 100 MG/5ML liquid Take 100 mg by mouth every 4 (four) hours as needed for cough.    Yes [provider]  hydrocortisone (ANUSOL-HC) 25 MG suppository Place 25 mg rectally every 12 (twelve) hours as needed (rectal bleeding).    Yes [provider]  loperamide (IMODIUM A-D) 2 MG tablet Take 2 mg by mouth 4 (four) times daily as needed for diarrhea or loose stools.   Yes [provider]  magnesium hydroxide (MILK OF MAGNESIA) 400 MG/5ML suspension Take 30 mLs by mouth daily as needed for mild constipation.   Yes [provider]  memantine (NAMENDA XR) 28 MG CP24 24 hr capsule Take 28 mg by mouth daily.   Yes [provider]  Multiple Vitamin (MULTI-VITAMINS) TABS Take 1 tablet by mouth daily.   Yes [provider]  Nutritional Supplements (ENSURE HIGH PROTEIN PO) Take 1 oz by mouth 4 (four) times daily.   Yes [provider]  omeprazole (PRILOSEC) 20 MG capsule Take 20 mg by mouth 2 (two) times daily.   Yes [provider]  polyethylene glycol (MIRALAX / GLYCOLAX) packet Take 17 g by mouth daily.   Yes [provider]  potassium chloride (K-DUR) 10 MEQ tablet Take 10 mEq by mouth daily as needed (edema). Take with lasix   Yes [provider]  sucralfate (CARAFATE) 1 GM/10ML suspension Take 1 g by mouth  2 (two) times daily before a meal.   Yes [provider]  tamsulosin (FLOMAX) 0.4 MG CAPS capsule Take 1 capsule by mouth daily. 06/15/12  Yes [provider]  traMADol (ULTRAM) 50 MG tablet Take 50 mg by mouth 3 (three) times daily as needed for moderate pain.    Yes [provider]  fluticasone (FLONASE) 50 MCG/ACT nasal spray Place 2 sprays into both nostrils daily. Patient not taking: Reported on 01/18/2018 08/13/17 08/13/18  Schaevitz, Randall An, MD   Allergies  Allergen Reactions  . Risperidone Hives and Other (See Comments)  . Levofloxacin Other (See Comments) and Rash   Review of Systems  Unable to perform ROS   Physical Exam  Constitutional: No distress.  Thin and frail.   Pulmonary/Chest: Effort normal.  Skin: Skin is warm and dry.    Vital Signs: BP (!) 91/52 (BP Location: Left Arm)   Pulse 71   Temp 98.2 F (36.8 C) (Oral)   Resp 20   Ht 5' (1.524 m)   Wt 29 kg (64 lb)   SpO2 100%   BMI 12.50 kg/m  Pain Scale: Faces POSS *See Group Information*: S-Acceptable,Sleep, easy to arouse     SpO2: SpO2: 100 % O2 Device:SpO2: 100 % O2 Flow Rate: .O2 Flow Rate (L/min): 2 L/min  IO: Intake/output summary:   Intake/Output Summary (Last 24 hours) at 01/19/2018 1513 Last data filed at 01/19/2018 0300 Gross per 24  hour  Intake 1065 ml  Output -  Net 1065 ml    LBM: Last BM Date: 01/19/18 Baseline Weight: Weight: 29.1 kg (64 lb 2 oz) Most recent weight: Weight: 29 kg (64 lb)     Palliative Assessment/Data: 10%     Time In: 2:20 Time Out: 3:40 Time Total: 70 min Greater than 50%  of this time was spent counseling and coordinating care related to the above assessment and plan.  Signed by: Asencion Gowda, NP   Please contact Palliative Medicine Team phone at [phone] for questions and concerns.  For individual provider: See Shea Evans

## 2018-01-20 MED ORDER — MORPHINE SULFATE (CONCENTRATE) 10 MG/0.5ML PO SOLN: 3.0000 mg | ORAL | 0 refills | Status: AC | PRN

## 2018-01-20 NOTE — Clinical Social Work Note (Signed)
Patient will be able to transfer to [facility] today. Patient's guardian Melbert Botelho [phone] is aware of discharge to hospice home today. Santiago Glad, hospice liaison will call report and call for transport.   [place], [place]

## 2018-01-20 NOTE — Discharge Summary (Signed)
[facility] [place] NAME: Shawn Sweeney    MR#:  [id number]  DATE OF BIRTH:  12/21/1955  DATE OF ADMISSION:  01/18/2018 ADMITTING PHYSICIAN: Saundra Shelling, MD  DATE OF DISCHARGE: 01/20/2018  PRIMARY CARE PHYSICIAN: Tama High III, MD    ADMISSION DIAGNOSIS:  Acute hypernatremia [E87.0] Hypoxia [R09.02] Altered mental status, unspecified altered mental status type [R41.82]  DISCHARGE DIAGNOSIS:  Active Problems:   Hypernatremia   Protein-calorie malnutrition, severe   Pressure injury of skin   SECONDARY DIAGNOSIS:   Past Medical History:  Diagnosis Date  . Abdominal wall hernia   . Adenocarcinoma of cecum ([place])   . Anemia   . Aortic atherosclerosis ([place]) 04/11/2016  . Blind   . BPH (benign prostatic hyperplasia)   . Calculus of gallbladder with acute cholecystitis and obstruction   . Cerebral palsy ([place])   . Congenital deafness   . Dermatophytosis of nail   . Elevated PSA   . GERD (gastroesophageal reflux disease) 01/16/2017  . Hypertension   . Lower extremity edema    echo with normal left and right heart fucntion, mild MR  . Mood disorder ([place])   . Nephrolithiasis   . Onychomycosis   . Phimosis   . Pneumonia 12/18/2014    HOSPITAL COURSE:   62 year old male patient with history of cerebral palsy, developmental retardation, bilateral deafness, blindness, hypertension, adenocarcinoma of cecum presented to the emergency room for lethargy and weakness  -Acute hypernatremia IV hydration with D5W Follow-up sodium levels closely- some improvement.  -Dehydration-due to decreased oral intake. IV fluids  -Elevated lactic acid most probably secondary to dehydration- no signs of active infection. Patient received 1 dose of IV antibiotics in the emergency room will follow up the lactic acid level  -Cerebral palsy with developmental retardation and bilateral deafness and blindness Supportive care  -DVT  prophylaxis subcu heparin  -After meeting with palliative care, family had agreed for comfort measures and admission to hospice home.    DISCHARGE CONDITIONS:   Stable.  CONSULTS OBTAINED:    DRUG ALLERGIES:   Allergies  Allergen Reactions  . Risperidone Hives and Other (See Comments)  . Levofloxacin Other (See Comments) and Rash    DISCHARGE MEDICATIONS:   Allergies as of 01/20/2018      Reactions   Risperidone Hives, Other (See Comments)   Levofloxacin Other (See Comments), Rash      Medication List    STOP taking these medications   ABILIFY 2 MG tablet Generic drug:  ARIPiprazole   docusate sodium 100 MG capsule Commonly known as:  COLACE   ENSURE HIGH PROTEIN PO   fexofenadine 180 MG tablet Commonly known as:  ALLEGRA   finasteride 5 MG tablet Commonly known as:  PROSCAR   FLOMAX 0.4 MG Caps capsule Generic drug:  tamsulosin   FLUoxetine 20 MG capsule Commonly known as:  PROZAC   fluticasone 50 MCG/ACT nasal spray Commonly known as:  FLONASE   furosemide 20 MG tablet Commonly known as:  LASIX   guaiFENesin 100 MG/5ML liquid Commonly known as:  ROBITUSSIN   hydrocortisone 25 MG suppository Commonly known as:  ANUSOL-HC   KLONOPIN 0.5 MG tablet Generic drug:  clonazePAM   loperamide 2 MG tablet Commonly known as:  IMODIUM A-D   magnesium hydroxide 400 MG/5ML suspension Commonly known as:  MILK OF MAGNESIA   MULTI-VITAMINS Tabs   NAMENDA XR 28 MG Cp24 24 hr capsule Generic drug:  memantine  omeprazole 20 MG capsule Commonly known as:  PRILOSEC   polyethylene glycol packet Commonly known as:  MIRALAX / GLYCOLAX   potassium chloride 10 MEQ tablet Commonly known as:  K-DUR   sucralfate 1 GM/10ML suspension Commonly known as:  CARAFATE   traMADol 50 MG tablet Commonly known as:  ULTRAM     TAKE these medications   acetaminophen 325 MG tablet Commonly known as:  TYLENOL Take 650 mg by mouth every 4 (four) hours as needed  for fever.   morphine CONCENTRATE 10 MG/0.5ML Soln concentrated solution Take 0.15 mLs (3 mg total) by mouth every 2 (two) hours as needed for severe pain (dyspnea).        DISCHARGE INSTRUCTIONS:    Comfort care.  If you experience worsening of your admission symptoms, develop shortness of breath, life threatening emergency, suicidal or homicidal thoughts you must seek medical attention immediately by calling 911 or calling your MD immediately  if symptoms less severe.  You Must read complete instructions/literature along with all the possible adverse reactions/side effects for all the Medicines you take and that have been prescribed to you. Take any new Medicines after you have completely understood and accept all the possible adverse reactions/side effects.   Please note  You were cared for by a hospitalist during your hospital stay. If you have any questions about your discharge medications or the care you received while you were in the hospital after you are discharged, you can call the unit and asked to speak with the hospitalist on call if the hospitalist that took care of you is not available. Once you are discharged, your primary care physician will handle any further medical issues. Please note that NO REFILLS for any discharge medications will be authorized once you are discharged, as it is imperative that you return to your primary care physician (or establish a relationship with a primary care physician if you do not have one) for your aftercare needs so that they can reassess your need for medications and monitor your lab values.    Today   CHIEF COMPLAINT:   Chief Complaint  Patient presents with  . Weakness    HISTORY OF PRESENT ILLNESS:  Shawn Sweeney  is a 62 y.o. male with a known history of cerebral palsy congenital deafness, blindness, adenocarcinoma of the cecum, hypertension, nephrolithiasis was referred from group home for generalized weakness.  Patient is  deaf and blind unable to give any history.  Patient's brother power of attorney was at bedside who said patient has become weak and has poor oral intake.  Patient is mostly bedbound and does not ambulate.  He was evaluated in the emergency room his sodium was level high and appeared dehydrated.  Patient lactic acid also was high and he was given 1 dose of IV antibiotics in the emergency room.  Hospitalist service was consulted for further care.   VITAL SIGNS:  Blood pressure (!) 82/39, pulse (!) 57, temperature 100.3 F (37.9 C), temperature source Axillary, resp. rate 14, height 5' (1.524 m), weight 29 kg (64 lb), SpO2 99 %.  I/O:  No intake or output data in the 24 hours ending 01/20/18 1033  PHYSICAL EXAMINATION:   GENERAL:  63 y.o.-year-old very thin patient lying in the bed with no acute distress.  EYES: Pupils equal, round, reactive to light , No scleral icterus. Extraocular muscles intact.  HEENT: Head atraumatic, normocephalic. Oropharynx and nasopharynx clear.  NECK:  Supple, no jugular venous distention. No thyroid enlargement,  no tenderness.  LUNGS: Normal breath sounds bilaterally, no wheezing, rales,rhonchi or crepitation. No use of accessory muscles of respiration.  CARDIOVASCULAR: S1, S2 normal. No murmurs, rubs, or gallops.  ABDOMEN: Soft, nontender, nondistended. Bowel sounds present. No organomegaly or mass.  EXTREMITIES: No pedal edema, cyanosis, or clubbing. Severe atrophy.  NEUROLOGIC: can not check as pt is lethargic. PSYCHIATRIC: The patient is lethargic. SKIN: no rash.   DATA REVIEW:   CBC Recent Labs  Lab 01/19/18 0636  WBC 11.2*  HGB 12.3*  HCT 37.8*  PLT 214    Chemistries  Recent Labs  Lab 01/18/18 1100  01/19/18 1024  NA 163*   < > 153*  K 4.5   < > 3.4*  CL 127*   < > 120*  CO2 29   < > 26  GLUCOSE 116*   < > 93  BUN 51*   < > 27*  CREATININE 1.14   < > 0.76  CALCIUM 8.6*   < > 8.0*  AST 20  --   --   ALT 17  --   --   ALKPHOS 77  --    --   BILITOT 0.4  --   --    < > = values in this interval not displayed.    Cardiac Enzymes No results for input(s): TROPONINI in the last 168 hours.  Microbiology Results  Results for orders placed or performed during the hospital encounter of 01/18/18  Blood Culture (routine x 2)     Status: None (Preliminary result)   Collection Time: 01/18/18 10:07 AM  Result Value Ref Range Status   Specimen Description BLOOD RIGHT ARM  Final   Special Requests   Final    BOTTLES DRAWN AEROBIC AND ANAEROBIC Blood Culture results may not be optimal due to an inadequate volume of blood received in culture bottles   Culture   Final    NO GROWTH < 24 HOURS Performed at [facility], [address], [place], [place] [address]    Report Status PENDING  Incomplete    RADIOLOGY:  No results found.  EKG:   Orders placed or performed during the hospital encounter of 01/18/18  . EKG 12-Lead  . EKG 12-Lead  . ED EKG 12-Lead  . ED EKG 12-Lead      Management plans discussed with the patient, family and they are in agreement.  CODE STATUS: DNR    Code Status Orders  (From admission, onward)        Start     Ordered   01/18/18 1230  Do not attempt resuscitation (DNR)  Continuous    Question Answer Comment  In the event of cardiac or respiratory ARREST Do not call a "code blue"   In the event of cardiac or respiratory ARREST Do not perform Intubation, CPR, defibrillation or ACLS   In the event of cardiac or respiratory ARREST Use medication by any route, position, wound care, and other measures to relive pain and suffering. May use oxygen, suction and manual treatment of airway obstruction as needed for comfort.      01/18/18 1229    Code Status History    Date Active Date Inactive Code Status Order ID Comments User Context   01/16/2017 2109 01/18/2017 1734 Partial Code [id number]  Lance Coon, MD Inpatient   12/15/2014 1454 12/18/2014 2117 Partial Code [id number]  Arne Cleveland, MD Inpatient   12/14/2014 1425 12/15/2014 1454 DNR [id number]  Demetrios Loll, MD Inpatient  Advance Directive Documentation     Most Recent Value  Type of Advance Directive  Healthcare Power of Attorney  Pre-existing out of facility DNR order (yellow form or pink MOST form)  -  "MOST" Form in Place?  -      TOTAL TIME TAKING CARE OF THIS PATIENT: 35 minutes.    Vaughan Basta M.D on 01/20/2018 at 10:33 AM  Between 7am to 6pm - Pager - [phone]  After 6pm go to [email or web address] - password [email or web address] [facility]  Office  [phone]  CC: Primary care physician; Adin Hector, MD   Note: This dictation was prepared with Dragon dictation along with smaller phrase technology. Any transcriptional errors that result from this process are unintentional.

## 2018-01-20 NOTE — Plan of Care (Signed)
  Problem: Education: Goal: Knowledge of General Education information will improve Description: Including pain rating scale, medication(s)/side effects and non-pharmacologic comfort measures Outcome: Progressing   Problem: Health Behavior/Discharge Planning: Goal: Ability to manage health-related needs will improve Outcome: Progressing   Problem: Elimination: Goal: Will not experience complications related to bowel motility Outcome: Progressing Goal: Will not experience complications related to urinary retention Outcome: Progressing   Problem: Safety: Goal: Ability to remain free from injury will improve Outcome: Progressing   

## 2018-01-20 NOTE — Progress Notes (Signed)
Daily Progress Note   Patient Name: Shawn Sweeney       Date: 01/20/2018 DOB: 03/17/1956  Age: 62 y.o. MRN#: [id number] Attending Physician: Vaughan Basta, * Primary Care Physician: Adin Hector, MD Admit Date: 01/18/2018  Reason for Consultation/Follow-up: Establishing goals of care  Subjective: Patient resting in bed on his side. No discomfort noted. Per nursing, no oral intake. Plans for D/C to hospice today.    Length of Stay: 2  Current Medications: Scheduled Meds:  . clonazePAM  0.5 mg Oral BID  . collagenase   Topical Daily  . docusate sodium  100 mg Oral BID  . memantine  28 mg Oral Daily    Continuous Infusions:   PRN Meds: acetaminophen **OR** acetaminophen, morphine CONCENTRATE, ondansetron **OR** ondansetron (ZOFRAN) IV  Physical Exam  Constitutional: No distress.  Pulmonary/Chest: Effort normal.  Skin: Skin is warm and dry.            Vital Signs: BP (!) 76/53 (BP Location: Right Arm)   Pulse 71   Temp 100.3 F (37.9 C) (Axillary)   Resp 16   Ht 5' (1.524 m)   Wt 29 kg (64 lb)   SpO2 100%   BMI 12.50 kg/m  SpO2: SpO2: 100 % O2 Device: O2 Device: Room Air O2 Flow Rate: O2 Flow Rate (L/min): 2 L/min  Intake/output summary: No intake or output data in the 24 hours ending 01/20/18 1148 LBM: Last BM Date: 01/19/18 Baseline Weight: Weight: 29.1 kg (64 lb 2 oz) Most recent weight: Weight: 29 kg (64 lb)       Palliative Assessment/Data: 10%      Patient Active Problem List   Diagnosis Date Noted  . Protein-calorie malnutrition, severe 01/19/2018  . Pressure injury of skin 01/19/2018  . Hypernatremia 01/18/2018  . Mood disorder ([place]) 08/12/2017  . Anemia, unspecified 08/12/2017  . UTI (urinary tract infection) 01/16/2017  . HTN  (hypertension) 01/16/2017  . BPH (benign prostatic hyperplasia) 01/16/2017  . GERD (gastroesophageal reflux disease) 01/16/2017  . Aortic atherosclerosis ([place]) 04/11/2016  . Choking 02/13/2016  . Bilateral inguinal hernia without obstruction or gangrene 01/14/2016  . Abnormal loss of weight 12/06/2015  . Pharyngoesophageal dysphagia 08/21/2015  . History of colon cancer 08/21/2015  . Pneumonia 12/18/2014  . Hypotension 12/15/2014  . Bradycardia 12/15/2014  .  Cerebral palsy ([place]) 12/15/2014  . 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 Blindness 12/15/2014  . Blindness of both eyes 12/15/2014  . Altered mental status 12/14/2014  . Hydronephrosis, left 12/14/2014  . Nephrolithiasis 12/14/2014    Class: Chronic  . Onychomycosis 09/22/2014  . Localized edema 09/22/2014  . 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 Abdominal wall hernia 07/06/2014  . Elevated prostate specific antigen (PSA) 06/15/2012    Palliative Care Assessment & Plan   Recommendations/Plan:  Continue BIPAP as needed. DNR/DNI. MOST form in chart.     Code Status:    Code Status Orders  (From admission, onward)        Start     Ordered   01/18/18 1230  Do not attempt resuscitation (DNR)  Continuous    Question Answer Comment  In the event of cardiac or respiratory ARREST Do not call a "code blue"   In the event of cardiac or respiratory ARREST Do not perform Intubation, CPR, defibrillation or ACLS   In the event of cardiac or respiratory ARREST Use medication by any route, position, wound care, and other measures to relive pain and suffering. May use oxygen, suction and manual treatment of airway obstruction as needed for comfort.      01/18/18 1229    Code Status History    Date Active Date Inactive Code Status Order ID Comments User Context   01/16/2017 2109 01/18/2017 1734 Partial Code [id number]  Lance Coon, MD Inpatient   12/15/2014 1454 12/18/2014 2117 Partial Code [id number]  Arne Cleveland, MD  Inpatient   12/14/2014 1425 12/15/2014 1454 DNR [id number]  Demetrios Loll, MD Inpatient    Advance Directive Documentation     Most Recent Value  Type of Advance Directive  Healthcare Power of Attorney  Pre-existing out of facility DNR order (yellow form or pink MOST form)  -  "MOST" Form in Place?  -       Prognosis:   < 2 weeks  Discharge Planning:  Hospice facility   Thank you for allowing the Palliative Medicine Team to assist in the care of this patient.   Total Time 15 min Prolonged Time Billed  No      Greater than 50%  of this time was spent counseling and coordinating care related to the above assessment and plan.  Asencion Gowda, NP  Please contact Palliative Medicine Team phone at [phone] for questions and concerns.

## 2018-01-20 NOTE — Progress Notes (Signed)
Follow visit made to new hospice home referral. Patient seen lying in bed. IV fluids discontinued, he remains with very little oral intake. Plan is for discharge to the hospice home today. Patient's brother Marlou Sa notified via telephone, report called to the hospice home, EMS notified for transport. Hospital care team all aware. Discharge summary faxed to referral. Thank you. Flo Shanks RN, BSN, [facility] [place], hospital liaison [phone]

## 2018-01-21 ENCOUNTER — Ambulatory Visit: Payer: Medicare Other | Admitting: Nurse Practitioner

## 2018-01-23 LAB — CULTURE, BLOOD (ROUTINE X 2): CULTURE: NO GROWTH

## 2018-02-28 DEATH — deceased
